# Patient Record
Sex: Female | Born: 1995 | Race: White | Hispanic: No | Smoking: Former smoker
Health system: Southern US, Community
[De-identification: ages and names within clinical notes are randomized; demographics above are authoritative.]

## PROBLEM LIST (undated history)

## (undated) ENCOUNTER — Inpatient Hospital Stay (HOSPITAL_COMMUNITY): Payer: Self-pay

## (undated) DIAGNOSIS — H9190 Unspecified hearing loss, unspecified ear: Secondary | ICD-10-CM

## (undated) DIAGNOSIS — F32A Depression, unspecified: Secondary | ICD-10-CM

## (undated) DIAGNOSIS — F329 Major depressive disorder, single episode, unspecified: Secondary | ICD-10-CM

## (undated) DIAGNOSIS — F419 Anxiety disorder, unspecified: Secondary | ICD-10-CM

## (undated) DIAGNOSIS — J45909 Unspecified asthma, uncomplicated: Secondary | ICD-10-CM

## (undated) DIAGNOSIS — F909 Attention-deficit hyperactivity disorder, unspecified type: Secondary | ICD-10-CM

## (undated) HISTORY — DX: Anxiety disorder, unspecified: F41.9

## (undated) HISTORY — DX: Major depressive disorder, single episode, unspecified: F32.9

## (undated) HISTORY — DX: Attention-deficit hyperactivity disorder, unspecified type: F90.9

## (undated) HISTORY — DX: Depression, unspecified: F32.A

## (undated) HISTORY — PX: NO PAST SURGERIES: SHX2092

---
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
## 2013-01-04 ENCOUNTER — Ambulatory Visit: Payer: Self-pay | Admitting: Advanced Practice Midwife

## 2013-01-11 ENCOUNTER — Ambulatory Visit: Payer: Self-pay | Admitting: Advanced Practice Midwife

## 2013-01-16 ENCOUNTER — Encounter: Payer: Self-pay | Admitting: Advanced Practice Midwife

## 2013-01-16 ENCOUNTER — Ambulatory Visit (INDEPENDENT_AMBULATORY_CARE_PROVIDER_SITE_OTHER): Payer: Medicaid Other | Admitting: Advanced Practice Midwife

## 2013-01-16 VITALS — BP 110/68 | Wt 123.4 lb

## 2013-01-16 DIAGNOSIS — IMO0001 Reserved for inherently not codable concepts without codable children: Secondary | ICD-10-CM

## 2013-01-16 DIAGNOSIS — Z3202 Encounter for pregnancy test, result negative: Secondary | ICD-10-CM

## 2013-01-16 MED ORDER — NORGESTIMATE-ETH ESTRADIOL 0.25-35 MG-MCG PO TABS: 1.0000 | ORAL_TABLET | Freq: Every day | ORAL | Status: DC

## 2013-01-16 NOTE — Progress Notes (Signed)
Patient ID: Lacey Hartman, female   DOB: 17-Aug-1996, 17 y.o.   MRN: [id number] Lacey Hartman is a 17 y.o. who presents for a postpartum visit. She is 8 weeks postpartum following a spontaneous vaginal delivery. I have fully reviewed the prenatal and intrapartum course. The delivery was at 36.1 gestational weeks.  Anesthesia: epidural. Postpartum course has been uneventful other than mild depression. Baby's course has been uneventful. Baby is feeding by bottle. Bleeding no bleeding. Has already had 1 menses. Bowel function is normal. Bladder function is normal. Patient is sexually active. Contraception method is none. Postpartum depression screening: positive, scored a 14. Denies suicidial/homicidal/infantcidle thoughts, feels like she is getting better.    Review of Systems Pertinent items are noted in HPI.   Objective:    There were no vitals taken for this visit.  General:  alert, cooperative and no distress   Breasts:  negative  Lungs: clear to auscultation bilaterally  Heart:  regular rate and rhythm  Abdomen: Soft, nontender   Vulva:  normal  Vagina: normal vagina  Cervix:  closed  Corpus: Well involuted     Rectal Exam: no hemorrhoids        Assessment:    Normal postpartum exam. Mild postpartum depression  Plan:    1. Contraception: OCP (estrogen/progesterone) 2. Follow up in:   or as needed.  3. Will not begin antidepressants at this time d/t pt preference. Pt to call office if feels like depression is not getting better or is worsening.

## 2013-07-23 ENCOUNTER — Telehealth: Payer: Self-pay | Admitting: Family Medicine

## 2013-07-23 NOTE — Telephone Encounter (Signed)
dysuria

## 2013-07-24 NOTE — Telephone Encounter (Signed)
The patient complained of dysuria. She brought her child into the office. She denied fever. Her urine did show WBCs. She was treated with Bactrim DS twice a day for 5 days. She was instructed if ongoing trouble to followup for official office visit she understood. Warnings discussed.

## 2013-10-18 NOTE — L&D Delivery Note (Signed)
Delivery Note Pt progressed nicely through labor.  She had SROM and quickly developed and urge to push. After a brief 2nd stage, at 4:42 AM a viable female was delivered via  (Presentation:ROA  ). THere was a loose nuchal cord, delivered throught.  APGAR: 9/9; weight pending .   Placenta status:  Placenta fell out at 0443, intact with 3 vessel cord:   Anesthesia:  epidural Episiotomy: none Lacerations: none Suture Repair: n/a Est. Blood Loss (mL): 100  Mom to postpartum.  Baby to Couplet care / Skin to Skin.  Hartman,Lacey Rubens 07/05/2014, 4:58 AM

## 2013-10-18 NOTE — L&D Delivery Note (Signed)
Attestation of Attending Supervision of Advanced Practitioner (CNM/NP): Evaluation and management procedures were performed by the Advanced Practitioner under my supervision and collaboration.  I have reviewed the Advanced Practitioner's note and chart, and I agree with the management and plan.  HARRAWAY-SMITH, Gerlean Cid 12:38 PM

## 2013-11-23 ENCOUNTER — Encounter: Payer: Self-pay | Admitting: Adult Health

## 2013-11-23 ENCOUNTER — Ambulatory Visit (INDEPENDENT_AMBULATORY_CARE_PROVIDER_SITE_OTHER): Payer: Medicaid Other | Admitting: Adult Health

## 2013-11-23 VITALS — BP 120/76 | Ht 62.0 in | Wt 115.0 lb

## 2013-11-23 DIAGNOSIS — Z3201 Encounter for pregnancy test, result positive: Secondary | ICD-10-CM

## 2013-11-23 DIAGNOSIS — Z3200 Encounter for pregnancy test, result unknown: Secondary | ICD-10-CM

## 2013-11-23 DIAGNOSIS — Z3202 Encounter for pregnancy test, result negative: Secondary | ICD-10-CM

## 2013-11-23 LAB — POCT URINE PREGNANCY: PREG TEST UR: POSITIVE

## 2013-11-23 MED ORDER — MEDROXYPROGESTERONE ACETATE 150 MG/ML IM SUSP: 150.0000 mg | Freq: Once | INTRAMUSCULAR | Status: DC

## 2013-11-27 ENCOUNTER — Other Ambulatory Visit: Payer: Self-pay | Admitting: Obstetrics & Gynecology

## 2013-11-27 DIAGNOSIS — O3680X0 Pregnancy with inconclusive fetal viability, not applicable or unspecified: Secondary | ICD-10-CM

## 2013-11-29 ENCOUNTER — Ambulatory Visit (INDEPENDENT_AMBULATORY_CARE_PROVIDER_SITE_OTHER): Payer: Medicaid Other

## 2013-11-29 DIAGNOSIS — O3680X0 Pregnancy with inconclusive fetal viability, not applicable or unspecified: Secondary | ICD-10-CM

## 2013-11-29 NOTE — Progress Notes (Signed)
U/S(8+1wks)-single IUP with +FCA noted, CRL c/w LMP dates, cx appears long and closed, bilateral adnexa wnl, FHR-175 bpm

## 2013-12-03 ENCOUNTER — Encounter: Payer: Self-pay | Admitting: Women's Health

## 2013-12-03 ENCOUNTER — Ambulatory Visit (INDEPENDENT_AMBULATORY_CARE_PROVIDER_SITE_OTHER): Payer: Medicaid Other | Admitting: Women's Health

## 2013-12-03 VITALS — BP 96/52 | Wt 115.0 lb

## 2013-12-03 DIAGNOSIS — F17200 Nicotine dependence, unspecified, uncomplicated: Secondary | ICD-10-CM

## 2013-12-03 DIAGNOSIS — O210 Mild hyperemesis gravidarum: Secondary | ICD-10-CM

## 2013-12-03 DIAGNOSIS — Z1389 Encounter for screening for other disorder: Secondary | ICD-10-CM

## 2013-12-03 DIAGNOSIS — Z331 Pregnant state, incidental: Secondary | ICD-10-CM

## 2013-12-03 DIAGNOSIS — O09219 Supervision of pregnancy with history of pre-term labor, unspecified trimester: Secondary | ICD-10-CM | POA: Insufficient documentation

## 2013-12-03 DIAGNOSIS — Z3480 Encounter for supervision of other normal pregnancy, unspecified trimester: Secondary | ICD-10-CM | POA: Insufficient documentation

## 2013-12-03 DIAGNOSIS — O09299 Supervision of pregnancy with other poor reproductive or obstetric history, unspecified trimester: Secondary | ICD-10-CM

## 2013-12-03 HISTORY — DX: Nicotine dependence, unspecified, uncomplicated: F17.200

## 2013-12-03 LAB — POCT URINALYSIS DIPSTICK
GLUCOSE UA: NEGATIVE
Ketones, UA: NEGATIVE
Leukocytes, UA: NEGATIVE
NITRITE UA: NEGATIVE
Protein, UA: NEGATIVE
RBC UA: NEGATIVE

## 2013-12-03 LAB — RPR

## 2013-12-03 MED ORDER — ONDANSETRON HCL 8 MG PO TABS: 8.0000 mg | ORAL_TABLET | Freq: Three times a day (TID) | ORAL | Status: DC | PRN

## 2013-12-03 NOTE — Patient Instructions (Signed)
Nausea & Vomiting  Have saltine crackers or pretzels by your bed and eat a few bites before you raise your head out of bed in the morning  Eat small frequent meals throughout the day instead of large meals  Drink plenty of fluids throughout the day to stay hydrated, just don't drink a lot of fluids with your meals.  This can make your stomach fill up faster making you feel sick  Do not brush your teeth right after you eat  Products with real ginger are good for nausea, like ginger ale and ginger hard candy Make sure it says made with real ginger!  Sucking on sour candy like lemon heads is also good for nausea  If your prenatal vitamins make you nauseated, take them at night so you will sleep through the nausea  If you feel like you need medicine for the nausea & vomiting please let us know  If you are unable to keep any fluids or food down please let us know   Constipation  Drink plenty of fluid, preferably water, throughout the day  Eat foods high in fiber such as fruits, vegetables, and grains  Exercise, such as walking, is a good way to keep your bowels regular  Drink warm fluids, especially warm prune juice, or decaf coffee  Eat a 1/2 cup of real oatmeal (not instant), 1/2 cup applesauce, and 1/2-1 cup warm prune juice every day  If needed, you may take Colace (docusate sodium) stool softener once or twice a day to help keep the stool soft. If you are pregnant, wait until you are out of your first trimester (12-14 weeks of pregnancy)  If you still are having problems with constipation, you may take Miralax once daily as needed to help keep your bowels regular.  If you are pregnant, wait until you are out of your first trimester (12-14 weeks of pregnancy)    Pregnancy - First Trimester During sexual intercourse, millions of sperm go into the vagina. Only 1 sperm will penetrate and fertilize the female egg while it is in the Fallopian tube. One week later, the fertilized egg  implants into the wall of the uterus. An embryo begins to develop into a baby. At 6 to 8 weeks, the eyes and face are formed and the heartbeat can be seen on ultrasound. At the end of 12 weeks (first trimester), all the baby's organs are formed. Now that you are pregnant, you will want to do everything you can to have a healthy baby. Two of the most important things are to get good prenatal care and follow your caregiver's instructions. Prenatal care is all the medical care you receive before the baby's birth. It is given to prevent, find, and treat problems during the pregnancy and childbirth. PRENATAL EXAMS  During prenatal visits, your weight, blood pressure, and urine are checked. This is done to make sure you are healthy and progressing normally during the pregnancy.  A pregnant woman should gain 25 to 35 pounds during the pregnancy. However, if you are overweight or underweight, your caregiver will advise you regarding your weight.  Your caregiver will ask and answer questions for you.  Blood work, cervical cultures, other necessary tests, and a Pap test are done during your prenatal exams. These tests are done to check on your health and the probable health of your baby. Tests are strongly recommended and done for HIV with your permission. This is the virus that causes AIDS. These tests are done because medicines   can be given to help prevent your baby from being born with this infection should you have been infected without knowing it. Blood work is also used to find out your blood type, previous infections, and follow your blood levels (hemoglobin).  Low hemoglobin (anemia) is common during pregnancy. Iron and vitamins are given to help prevent this. Later in the pregnancy, blood tests for diabetes will be done along with any other tests if any problems develop.  You may need other tests to make sure you and the baby are doing well. CHANGES DURING THE FIRST TRIMESTER  Your body goes through  many changes during pregnancy. They vary from person to person. Talk to your caregiver about changes you notice and are concerned about. Changes can include:  Your menstrual period stops.  The egg and sperm carry the genes that determine what you look like. Genes from you and your partner are forming a baby. The female genes determine whether the baby is a boy or a girl.  Your body increases in girth and you may feel bloated.  Feeling sick to your stomach (nauseous) and throwing up (vomiting). If the vomiting is uncontrollable, call your caregiver.  Your breasts will begin to enlarge and become tender.  Your nipples may stick out more and become darker.  The need to urinate more. Painful urination may mean you have a bladder infection.  Tiring easily.  Loss of appetite.  Cravings for certain kinds of food.  At first, you may gain or lose a couple of pounds.  You may have changes in your emotions from day to day (excited to be pregnant or concerned something may go wrong with the pregnancy and baby).  You may have more vivid and strange dreams. HOME CARE INSTRUCTIONS   It is very important to avoid all smoking, alcohol and non-prescribed drugs during your pregnancy. These affect the formation and growth of the baby. Avoid chemicals while pregnant to ensure the delivery of a healthy infant.  Start your prenatal visits by the 12th week of pregnancy. They are usually scheduled monthly at first, then more often in the last 2 months before delivery. Keep your caregiver's appointments. Follow your caregiver's instructions regarding medicine use, blood and lab tests, exercise, and diet.  During pregnancy, you are providing food for you and your baby. Eat regular, well-balanced meals. Choose foods such as meat, fish, milk and other low fat dairy products, vegetables, fruits, and whole-grain breads and cereals. Your caregiver will tell you of the ideal weight gain.  You can help morning  sickness by keeping soda crackers at the bedside. Eat a couple before arising in the morning. You may want to use the crackers without salt on them.  Eating 4 to 5 small meals rather than 3 large meals a day also may help the nausea and vomiting.  Drinking liquids between meals instead of during meals also seems to help nausea and vomiting.  A physical sexual relationship may be continued throughout pregnancy if there are no other problems. Problems may be early (premature) leaking of amniotic fluid from the membranes, vaginal bleeding, or belly (abdominal) pain.  Exercise regularly if there are no restrictions. Check with your caregiver or physical therapist if you are unsure of the safety of some of your exercises. Greater weight gain will occur in the last 2 trimesters of pregnancy. Exercising will help:  Control your weight.  Keep you in shape.  Prepare you for labor and delivery.  Help you lose your pregnancy  weight after you deliver your baby.  Wear a good support or jogging bra for breast tenderness during pregnancy. This may help if worn during sleep too.  Ask when prenatal classes are available. Begin classes when they are offered.  Do not use hot tubs, steam rooms, or saunas.  Wear your seat belt when driving. This protects you and your baby if you are in an accident.  Avoid raw meat, uncooked cheese, cat litter boxes, and soil used by cats throughout the pregnancy. These carry germs that can cause birth defects in the baby.  The first trimester is a good time to visit your dentist for your dental health. Getting your teeth cleaned is okay. Use a softer toothbrush and brush gently during pregnancy.  Ask for help if you have financial, counseling, or nutritional needs during pregnancy. Your caregiver will be able to offer counseling for these needs as well as refer you for other special needs.  Do not take any medicines or herbs unless told by your caregiver.  Inform your  caregiver if there is any mental or physical domestic violence.  Make a list of emergency phone numbers of family, friends, hospital, and police and fire departments.  Write down your questions. Take them to your prenatal visit.  Do not douche.  Do not cross your legs.  If you have to stand for long periods of time, rotate you feet or take small steps in a circle.  You may have more vaginal secretions that may require a sanitary pad. Do not use tampons or scented sanitary pads. MEDICINES AND DRUG USE IN PREGNANCY  Take prenatal vitamins as directed. The vitamin should contain 1 milligram of folic acid. Keep all vitamins out of reach of children. Only a couple vitamins or tablets containing iron may be fatal to a baby or young child when ingested.  Avoid use of all medicines, including herbs, over-the-counter medicines, not prescribed or suggested by your caregiver. Only take over-the-counter or prescription medicines for pain, discomfort, or fever as directed by your caregiver. Do not use aspirin, ibuprofen, or naproxen unless directed by your caregiver.  Let your caregiver also know about herbs you may be using.  Alcohol is related to a number of birth defects. This includes fetal alcohol syndrome. All alcohol, in any form, should be avoided completely. Smoking will cause low birth rate and premature babies.  Street or illegal drugs are very harmful to the baby. They are absolutely forbidden. A baby born to an addicted mother will be addicted at birth. The baby will go through the same withdrawal an adult does.  Let your caregiver know about any medicines that you have to take and for what reason you take them. SEEK MEDICAL CARE IF:  You have any concerns or worries during your pregnancy. It is better to call with your questions if you feel they cannot wait, rather than worry about them. SEEK IMMEDIATE MEDICAL CARE IF:   An unexplained oral temperature above 102 F (38.9 C) develops,  or as your caregiver suggests.  You have leaking of fluid from the vagina (birth canal). If leaking membranes are suspected, take your temperature and inform your caregiver of this when you call.  There is vaginal spotting or bleeding. Notify your caregiver of the amount and how many pads are used.  You develop a bad smelling vaginal discharge with a change in the color.  You continue to feel sick to your stomach (nauseated) and have no relief from remedies suggested. You   vomit blood or coffee ground-like materials.  You lose more than 2 pounds of weight in 1 week.  You gain more than 2 pounds of weight in 1 week and you notice swelling of your face, hands, feet, or legs.  You gain 5 pounds or more in 1 week (even if you do not have swelling of your hands, face, legs, or feet).  You get exposed to [place] measles and have never had them.  You are exposed to fifth disease or chickenpox.  You develop belly (abdominal) pain. Round ligament discomfort is a common non-cancerous (benign) cause of abdominal pain in pregnancy. Your caregiver still must evaluate this.  You develop headache, fever, diarrhea, pain with urination, or shortness of breath.  You fall or are in a car accident or have any kind of trauma.  There is mental or physical violence in your home. Document Released: 09/28/2001 Document Revised: 06/28/2012 Document Reviewed: 04/01/2009 [facility] Patient Information 2014 [place].

## 2013-12-03 NOTE — Progress Notes (Signed)
  Subjective:  Lacey Hartman is a 18 y.o. [phone] Caucasian female at 109w5d by LMP which differs from 8wk u/s by only 2d, being seen today for her first obstetrical visit.  Her obstetrical history is significant for smoker and prev spontaneous ptb @ 36wks, SAB in Dec.  Smoked 1-2ppd prior to pregnancy, down to 2cigs/day and trying to quit completely. Pregnancy history fully reviewed.  Patient reports headache, nausea and vomiting and constipation. Is already taking 4diclegis daily and has been taking some of her grandmother's zofran. Vomits ~ 3x/day.  Denies vb, cramping, uti s/s, abnormal/malodorous vag d/c, or vulvovaginal itching/irritation.  BP 96/52  Wt 115 lb (52.164 kg)  LMP 10/03/2013  HISTORY: OB History  Gravida Para Term Preterm AB SAB TAB Ectopic Multiple Living  3 1 0 1 1 1 0 0 0 1     # Outcome Date GA Lbr Len/2nd Weight Sex Delivery Anes PTL Lv  3 CUR           2 SAB 09/2013          1 PRE 11/23/12 [redacted]w[redacted]d 05:41 / 00:10 5 lb 7.8 oz (2.489 kg) F SVD EPI  Y     Past Medical History  Diagnosis Date  . Hard of hearing   . Asthma     inhaler prn   Past Surgical History  Procedure Laterality Date  . No past surgeries     Family History  Problem Relation Age of Onset  . Cancer Maternal Grandmother   . Cancer Maternal Uncle     Exam   System:     General: Well developed & nourished, no acute distress   Skin: Warm & dry, normal coloration and turgor, no rashes   Neurologic: Alert & oriented, normal mood   Cardiovascular: Regular rate & rhythm   Respiratory: Effort & rate normal, LCTAB, acyanotic   Abdomen: Soft, non tender   Extremities: normal strength, tone   Thin prep pap smear n/a d/t age  FHR: 72 via informal transabdominal u/s   Assessment:   Pregnancy: G3P0111 Patient Active Problem List   Diagnosis Date Noted  . Supervision of other normal pregnancy 12/03/2013    Priority: High  . Previous preterm delivery, antepartum 12/03/2013    Priority: High   . Smoker 12/03/2013    Priority: High    [redacted]w[redacted]d G3P0111 New OB visit H/O spont 36wk PTB Smoker N/V of pregnancy Constipation  Plan:  Initial labs drawn Continue prenatal vitamins Problem list reviewed and updated Reviewed n/v relief measures and warning s/s to report, already taking diclegis QID Rx zofran per pt request Reviewed recommended weight gain based on pre-gravid BMI Encouraged well-balanced diet Genetic Screening discussed Integrated Screen: requested Cystic fibrosis screening discussed neg prev preg Ultrasound discussed; fetal survey: requested Follow up in 4 weeks for 1st it/nt and lrob Discussed/offered Makena, will take pamphlet home to review and will let us know at next visit [place] completed Info on constipation given Encouraged smoking cessation  Tawnya Crook CNM, [facility] 12/03/2013 11:45 AM

## 2013-12-04 LAB — CBC
HCT: 40.2 % (ref 36.0–46.0)
Hemoglobin: 13.2 g/dL (ref 12.0–15.0)
MCH: 30.6 pg (ref 26.0–34.0)
MCHC: 32.8 g/dL (ref 30.0–36.0)
MCV: 93.1 fL (ref 78.0–100.0)
PLATELETS: 273 10*3/uL (ref 150–400)
RBC: 4.32 MIL/uL (ref 3.87–5.11)
RDW: 13.4 % (ref 11.5–15.5)
WBC: 7.9 10*3/uL (ref 4.0–10.5)

## 2013-12-04 LAB — URINALYSIS
BILIRUBIN URINE: NEGATIVE
GLUCOSE, UA: NEGATIVE mg/dL
HGB URINE DIPSTICK: NEGATIVE
KETONES UR: NEGATIVE mg/dL
LEUKOCYTES UA: NEGATIVE
NITRITE: NEGATIVE
PROTEIN: NEGATIVE mg/dL
SPECIFIC GRAVITY, URINE: 1.021 (ref 1.005–1.030)
UROBILINOGEN UA: 0.2 mg/dL (ref 0.0–1.0)
pH: 6.5 (ref 5.0–8.0)

## 2013-12-04 LAB — OXYCODONE SCREEN, UA, RFLX CONFIRM: Oxycodone Screen, Ur: NEGATIVE ng/mL

## 2013-12-04 LAB — RUBELLA SCREEN: RUBELLA: 4.2 {index} — AB (ref <0.90)

## 2013-12-04 LAB — GC/CHLAMYDIA PROBE AMP
CT Probe RNA: NEGATIVE
GC Probe RNA: NEGATIVE

## 2013-12-04 LAB — HEPATITIS B SURFACE ANTIGEN: HEP B S AG: NEGATIVE

## 2013-12-04 LAB — ANTIBODY SCREEN: ANTIBODY SCREEN: NEGATIVE

## 2013-12-04 LAB — VARICELLA ZOSTER ANTIBODY, IGG: Varicella IgG: >4000 Index — ABNORMAL HIGH (ref <135.00)

## 2013-12-04 LAB — ABO AND RH: Rh Type: POSITIVE

## 2013-12-04 LAB — HIV ANTIBODY (ROUTINE TESTING W REFLEX): HIV: NONREACTIVE

## 2013-12-05 ENCOUNTER — Encounter: Payer: Self-pay | Admitting: Women's Health

## 2013-12-05 DIAGNOSIS — O99330 Smoking (tobacco) complicating pregnancy, unspecified trimester: Secondary | ICD-10-CM

## 2013-12-05 LAB — DRUG SCREEN, URINE, NO CONFIRMATION
Amphetamine Screen, Ur: NEGATIVE
BARBITURATE QUANT UR: NEGATIVE
Benzodiazepines.: NEGATIVE
Cocaine Metabolites: NEGATIVE
Creatinine,U: 173.7 mg/dL
MARIJUANA METABOLITE: NEGATIVE
METHADONE: NEGATIVE
OPIATE SCREEN, URINE: NEGATIVE
PHENCYCLIDINE (PCP): NEGATIVE
PROPOXYPHENE: NEGATIVE

## 2013-12-05 LAB — URINE CULTURE
Colony Count: NO GROWTH
Organism ID, Bacteria: NO GROWTH

## 2013-12-06 ENCOUNTER — Encounter: Payer: Self-pay | Admitting: Advanced Practice Midwife

## 2014-01-01 ENCOUNTER — Ambulatory Visit (INDEPENDENT_AMBULATORY_CARE_PROVIDER_SITE_OTHER): Payer: Medicaid Other

## 2014-01-01 ENCOUNTER — Ambulatory Visit (INDEPENDENT_AMBULATORY_CARE_PROVIDER_SITE_OTHER): Payer: Medicaid Other | Admitting: Women's Health

## 2014-01-01 ENCOUNTER — Encounter (INDEPENDENT_AMBULATORY_CARE_PROVIDER_SITE_OTHER): Payer: Self-pay

## 2014-01-01 ENCOUNTER — Other Ambulatory Visit: Payer: Self-pay | Admitting: Women's Health

## 2014-01-01 ENCOUNTER — Encounter: Payer: Self-pay | Admitting: Women's Health

## 2014-01-01 VITALS — BP 98/54 | Wt 114.0 lb

## 2014-01-01 DIAGNOSIS — O210 Mild hyperemesis gravidarum: Secondary | ICD-10-CM

## 2014-01-01 DIAGNOSIS — O09299 Supervision of pregnancy with other poor reproductive or obstetric history, unspecified trimester: Secondary | ICD-10-CM

## 2014-01-01 DIAGNOSIS — Z3480 Encounter for supervision of other normal pregnancy, unspecified trimester: Secondary | ICD-10-CM

## 2014-01-01 DIAGNOSIS — Z1389 Encounter for screening for other disorder: Secondary | ICD-10-CM

## 2014-01-01 DIAGNOSIS — Z331 Pregnant state, incidental: Secondary | ICD-10-CM

## 2014-01-01 DIAGNOSIS — O0990 Supervision of high risk pregnancy, unspecified, unspecified trimester: Secondary | ICD-10-CM

## 2014-01-01 DIAGNOSIS — O09219 Supervision of pregnancy with history of pre-term labor, unspecified trimester: Secondary | ICD-10-CM

## 2014-01-01 DIAGNOSIS — Z36 Encounter for antenatal screening of mother: Secondary | ICD-10-CM

## 2014-01-01 DIAGNOSIS — F17200 Nicotine dependence, unspecified, uncomplicated: Secondary | ICD-10-CM

## 2014-01-01 DIAGNOSIS — O99330 Smoking (tobacco) complicating pregnancy, unspecified trimester: Secondary | ICD-10-CM

## 2014-01-01 LAB — POCT URINALYSIS DIPSTICK
Blood, UA: NEGATIVE
GLUCOSE UA: NEGATIVE
Ketones, UA: NEGATIVE
Leukocytes, UA: NEGATIVE
Nitrite, UA: NEGATIVE

## 2014-01-01 NOTE — Progress Notes (Signed)
U/S(12+6wks)-single active fetus, CRL c/w dates, cx appears closed (3.1cm), bilateral adnexa WNL, NB present, NT-1.55mm, FHR- 150 BPM, anterior Gr 0 placenta noted

## 2014-01-01 NOTE — Progress Notes (Addendum)
Denies cramping, lof, vb, uti s/s. Gma told her she shouldn't take zofran b/c of commercial on tv, discussed r/b zofran, failed diclegis, doesn't want phenergan. Pt desires to continue zofran. Constipation- discussed prevention/relief measures. HA's. Encouraged increasing po hydration, eating small frequent meals/snacks, apap prn as directed on bottle.  Smoking 4cigs/day. Encouraged cessation. Wants to quit. [facility] referral completed and faxed. Reviewed warning s/s to report.  All questions answered. F/U in 4wks for 2nd IT and visit.  1st NT/IT today.

## 2014-01-01 NOTE — Patient Instructions (Addendum)
Constipation  Drink plenty of fluid, preferably water, throughout the day  Eat foods high in fiber such as fruits, vegetables, and grains  Exercise, such as walking, is a good way to keep your bowels regular  Drink warm fluids, especially warm prune juice, or decaf coffee  Eat a 1/2 cup of real oatmeal (not instant), 1/2 cup applesauce, and 1/2-1 cup warm prune juice every day  If needed, you may take Colace (docusate sodium) stool softener once or twice a day to help keep the stool soft. If you are pregnant, wait until you are out of your first trimester (12-14 weeks of pregnancy)  If you still are having problems with constipation, you may take Miralax once daily as needed to help keep your bowels regular.  If you are pregnant, wait until you are out of your first trimester (12-14 weeks of pregnancy)    Smoking Cessation Quitting smoking is important to your health and has many advantages. However, it is not always easy to quit since nicotine is a very addictive drug. Often times, people try 3 times or more before being able to quit. This document explains the best ways for you to prepare to quit smoking. Quitting takes hard work and a lot of effort, but you can do it. ADVANTAGES OF QUITTING SMOKING  You will live longer, feel better, and live better.  Your body will feel the impact of quitting smoking almost immediately.  Within 20 minutes, blood pressure decreases. Your pulse returns to its normal level.  After 8 hours, carbon monoxide levels in the blood return to normal. Your oxygen level increases.  After 24 hours, the chance of having a heart attack starts to decrease. Your breath, hair, and body stop smelling like smoke.  After 48 hours, damaged nerve endings begin to recover. Your sense of taste and smell improve.  After 72 hours, the body is virtually free of nicotine. Your bronchial tubes relax and breathing becomes easier.  After 2 to 12 weeks, lungs can hold more  air. Exercise becomes easier and circulation improves.  The risk of having a heart attack, stroke, cancer, or lung disease is greatly reduced.  After 1 year, the risk of coronary heart disease is cut in half.  After 5 years, the risk of stroke falls to the same as a nonsmoker.  After 10 years, the risk of lung cancer is cut in half and the risk of other cancers decreases significantly.  After 15 years, the risk of coronary heart disease drops, usually to the level of a nonsmoker.  If you are pregnant, quitting smoking will improve your chances of having a healthy baby.  The people you live with, especially any children, will be healthier.  You will have extra money to spend on things other than cigarettes. QUESTIONS TO THINK ABOUT BEFORE ATTEMPTING TO QUIT You may want to talk about your answers with your caregiver.  Why do you want to quit?  If you tried to quit in the past, what helped and what did not?  What will be the most difficult situations for you after you quit? How will you plan to handle them?  Who can help you through the tough times? Your family? Friends? A caregiver?  What pleasures do you get from smoking? What ways can you still get pleasure if you quit? Here are some questions to ask your caregiver:  How can you help me to be successful at quitting?  What medicine do you think would be best  for me and how should I take it?  What should I do if I need more help?  What is smoking withdrawal like? How can I get information on withdrawal? GET READY  Set a quit date.  Change your environment by getting rid of all cigarettes, ashtrays, matches, and lighters in your home, car, or work. Do not let people smoke in your home.  Review your past attempts to quit. Think about what worked and what did not. GET SUPPORT AND ENCOURAGEMENT You have a better chance of being successful if you have help. You can get support in many ways.  Tell your family, friends, and  co-workers that you are going to quit and need their support. Ask them not to smoke around you.  Get individual, group, or telephone counseling and support. Programs are available at [employer] and health centers. Call your local health department for information about programs in your area.  Spiritual beliefs and practices may help some smokers quit.  Download a "quit meter" on your computer to keep track of quit statistics, such as how long you have gone without smoking, cigarettes not smoked, and money saved.  Get a self-help book about quitting smoking and staying off of tobacco. [place] yourself from urges to smoke. Talk to someone, go for a walk, or occupy your time with a task.  Change your normal routine. Take a different route to work. Drink tea instead of coffee. Eat breakfast in a different place.  Reduce your stress. Take a hot bath, exercise, or read a book.  Plan something enjoyable to do every day. Reward yourself for not smoking.  Explore interactive web-based programs that specialize in helping you quit. GET MEDICINE AND USE IT CORRECTLY Medicines can help you stop smoking and decrease the urge to smoke. Combining medicine with the above behavioral methods and support can greatly increase your chances of successfully quitting smoking.  Nicotine replacement therapy helps deliver nicotine to your body without the negative effects and risks of smoking. Nicotine replacement therapy includes nicotine gum, lozenges, inhalers, nasal sprays, and skin patches. Some may be available over-the-counter and others require a prescription.  Antidepressant medicine helps people abstain from smoking, but how this works is unknown. This medicine is available by prescription.  Nicotinic receptor partial agonist medicine simulates the effect of nicotine in your brain. This medicine is available by prescription. Ask your caregiver for advice about which  medicines to use and how to use them based on your health history. Your caregiver will tell you what side effects to look out for if you choose to be on a medicine or therapy. Carefully read the information on the package. Do not use any other product containing nicotine while using a nicotine replacement product.  RELAPSE OR DIFFICULT SITUATIONS Most relapses occur within the first 3 months after quitting. Do not be discouraged if you start smoking again. Remember, most people try several times before finally quitting. You may have symptoms of withdrawal because your body is used to nicotine. You may crave cigarettes, be irritable, feel very hungry, cough often, get headaches, or have difficulty concentrating. The withdrawal symptoms are only temporary. They are strongest when you first quit, but they will go away within 10 14 days. To reduce the chances of relapse, try to:  Avoid drinking alcohol. Drinking lowers your chances of successfully quitting.  Reduce the amount of caffeine you consume. Once you quit smoking, the amount of caffeine in your body  increases and can give you symptoms, such as a rapid heartbeat, sweating, and anxiety.  Avoid smokers because they can make you want to smoke.  Do not let weight gain distract you. Many smokers will gain weight when they quit, usually less than 10 pounds. Eat a healthy diet and stay active. You can always lose the weight gained after you quit.  Find ways to improve your mood other than smoking. FOR MORE INFORMATION  www.smokefree.gov  Document Released: 09/28/2001 Document Revised: 04/04/2012 Document Reviewed: 01/13/2012 [facility] Patient Information 2014 [place], [place].

## 2014-01-04 LAB — MATERNAL SCREEN, INTEGRATED #1

## 2014-01-09 ENCOUNTER — Telehealth: Payer: Self-pay

## 2014-01-09 NOTE — Telephone Encounter (Signed)
Pt states had some vaginal bleeding and cramping after sex last night, bleeding has stopped now. Informed to push fluids, can take tylenol for cramping. If bleeding reoccurs, cramping increases or does not improve to call our office back. Pt verbalized understanding.

## 2014-01-12 ENCOUNTER — Encounter (HOSPITAL_COMMUNITY): Payer: Self-pay | Admitting: *Deleted

## 2014-01-12 ENCOUNTER — Inpatient Hospital Stay (HOSPITAL_COMMUNITY)
Admission: AD | Admit: 2014-01-12 | Discharge: 2014-01-12 | Disposition: A | Discharge status: Home / Self Care | Payer: Medicaid Other | Source: Ambulatory Visit | Attending: Obstetrics & Gynecology | Admitting: Obstetrics & Gynecology

## 2014-01-12 DIAGNOSIS — E860 Dehydration: Secondary | ICD-10-CM | POA: Insufficient documentation

## 2014-01-12 DIAGNOSIS — R112 Nausea with vomiting, unspecified: Secondary | ICD-10-CM

## 2014-01-12 DIAGNOSIS — O99330 Smoking (tobacco) complicating pregnancy, unspecified trimester: Secondary | ICD-10-CM | POA: Insufficient documentation

## 2014-01-12 DIAGNOSIS — O99891 Other specified diseases and conditions complicating pregnancy: Secondary | ICD-10-CM

## 2014-01-12 DIAGNOSIS — R109 Unspecified abdominal pain: Secondary | ICD-10-CM | POA: Insufficient documentation

## 2014-01-12 DIAGNOSIS — R5383 Other fatigue: Secondary | ICD-10-CM

## 2014-01-12 DIAGNOSIS — H9190 Unspecified hearing loss, unspecified ear: Secondary | ICD-10-CM | POA: Insufficient documentation

## 2014-01-12 DIAGNOSIS — K5900 Constipation, unspecified: Secondary | ICD-10-CM | POA: Insufficient documentation

## 2014-01-12 DIAGNOSIS — O9989 Other specified diseases and conditions complicating pregnancy, childbirth and the puerperium: Secondary | ICD-10-CM

## 2014-01-12 DIAGNOSIS — O211 Hyperemesis gravidarum with metabolic disturbance: Secondary | ICD-10-CM | POA: Insufficient documentation

## 2014-01-12 DIAGNOSIS — O09219 Supervision of pregnancy with history of pre-term labor, unspecified trimester: Secondary | ICD-10-CM

## 2014-01-12 DIAGNOSIS — R5381 Other malaise: Secondary | ICD-10-CM

## 2014-01-12 DIAGNOSIS — O219 Vomiting of pregnancy, unspecified: Secondary | ICD-10-CM

## 2014-01-12 LAB — URINALYSIS, ROUTINE W REFLEX MICROSCOPIC
Bilirubin Urine: NEGATIVE
GLUCOSE, UA: NEGATIVE mg/dL
Hgb urine dipstick: NEGATIVE
Ketones, ur: 40 mg/dL — AB
Nitrite: NEGATIVE
Protein, ur: NEGATIVE mg/dL
UROBILINOGEN UA: 0.2 mg/dL (ref 0.0–1.0)
pH: 6 (ref 5.0–8.0)

## 2014-01-12 LAB — URINE MICROSCOPIC-ADD ON

## 2014-01-12 MED ORDER — PROMETHAZINE HCL 25 MG/ML IJ SOLN
25.0000 mg | INTRAVENOUS | Status: DC
  Administered 2014-01-12: 25 mg via INTRAVENOUS
  Filled 2014-01-12: qty 1

## 2014-01-12 NOTE — MAU Note (Signed)
Patient states she has had vomiting for 4 days and has not been able to keep anything down. States she has been taking Zofran but is not working. Has a little cramping but is constipated and has not had a good BM in about 1 1/2 weeks. Denies bleeding or discharge.

## 2014-01-12 NOTE — MAU Provider Note (Signed)
Attestation of Attending Supervision of Advanced Practitioner (PA/CNM/NP): Evaluation and management procedures were performed by the Advanced Practitioner under my supervision and collaboration.  I have reviewed the Advanced Practitioner's note and chart, and I agree with the management and plan.  Ahria Slappey, MD, FACOG Attending Obstetrician & Gynecologist Faculty Practice, [facility]  

## 2014-01-12 NOTE — MAU Provider Note (Signed)
Chief Complaint: Emesis During Pregnancy and Fatigue   First Provider Initiated Contact with Patient 01/12/14 1721     SUBJECTIVE HPI: Lacey Hartman is a 18 y.o. [id number] at [redacted]w[redacted]d by LMP who presents with worsening nausea and vomiting for the past 4 days. Has not retained any solids but retained sips of soda today. Tired but denies orthostatic symptoms. Has been busy caring for 63 month old. Takes Zofran once a day but it is not effective. Had Diglegis which was not effective.   Weight at NOB 8wks 115#.  Past Medical History  Diagnosis Date  . Hard of hearing   . Asthma     inhaler prn   OB History  Gravida Para Term Preterm AB SAB TAB Ectopic Multiple Living  3 1 0 1 1 1 0 0 0 1     # Outcome Date GA Lbr Len/2nd Weight Sex Delivery Anes PTL Lv  3 CUR           2 SAB 08/2013          1 PRE 11/23/12 [redacted]w[redacted]d 05:41 / 00:10 2.489 kg (5 lb 7.8 oz) F SVD EPI  Y     Past Surgical History  Procedure Laterality Date  . No past surgeries     History   Social History  . Marital Status: Single    Spouse Name: N/A    Number of Children: N/A  . Years of Education: N/A   Occupational History  . Not on file.   Social History Main Topics  . Smoking status: Current Every Day Smoker -- 0.50 packs/day for 5 years    Types: Cigarettes  . Smokeless tobacco: Never Used  . Alcohol Use: No  . Drug Use: No  . Sexual Activity: Yes    Birth Control/ Protection: None   Other Topics Concern  . Not on file   Social History Narrative  . No narrative on file   No current facility-administered medications on file prior to encounter.   Current Outpatient Prescriptions on File Prior to Encounter  Medication Sig Dispense Refill  . acetaminophen (TYLENOL) 500 MG tablet Take 500 mg by mouth every 6 (six) hours as needed for headache.       . ondansetron (ZOFRAN) 8 MG tablet Take 1 tablet (8 mg total) by mouth every 8 (eight) hours as needed for nausea or vomiting.  30 tablet  1  . Prenatal Vit-Fe  Fumarate-FA (PRENATAL MULTIVITAMIN) TABS Take 1 tablet by mouth daily.       Allergies  Allergen Reactions  . Codeine Hives    & hallucinations    ROS: Pertinent items in HPI  OBJECTIVE Blood pressure 96/60, pulse 87, temperature 98.6 F (37 C), temperature source Oral, resp. rate 16, height 5\' 2"  (1.575 m), weight 52.254 kg (115 lb 3.2 oz), last menstrual period 10/03/2013, SpO2 98.00%, not currently breastfeeding. GENERAL: Well-developed, well-nourished female in no acute distress.  HEENT: Normocephalic HEART: normal rate RESP: normal effort ABDOMEN: Soft, non-tender, S=D, DT 148 EXTREMITIES: Nontender, no edema NEURO: Alert and oriented   LAB RESULTS Results for orders placed during the hospital encounter of 01/12/14 (from the past 24 hour(s))  URINALYSIS, ROUTINE W REFLEX MICROSCOPIC     Status: Abnormal   Collection Time    01/12/14  5:00 PM      Result Value Ref Range   Color, Urine YELLOW  YELLOW   APPearance CLEAR  CLEAR   Specific Gravity, Urine >1.030 (*) 1.005 - 1.030  pH 6.0  5.0 - 8.0   Glucose, UA NEGATIVE  NEGATIVE mg/dL   Hgb urine dipstick NEGATIVE  NEGATIVE   Bilirubin Urine NEGATIVE  NEGATIVE   Ketones, ur 40 (*) NEGATIVE mg/dL   Protein, ur NEGATIVE  NEGATIVE mg/dL   Urobilinogen, UA 0.2  0.0 - 1.0 mg/dL   Nitrite NEGATIVE  NEGATIVE   Leukocytes, UA SMALL (*) NEGATIVE  URINE MICROSCOPIC-ADD ON     Status: Abnormal   Collection Time    01/12/14  5:00 PM      Result Value Ref Range   Squamous Epithelial / LPF MANY (*) RARE   WBC, UA 3-6  <3 WBC/hpf   Urine-Other MUCOUS PRESENT      IMAGING US Fetal Nuchal Translucency Measurement  01/01/2014   NUCHAL TRANSLUCENCY FOR INTEGRATED TESTING   Lacey Hartman is in the office for nuchal translucency sonogram as part  of an integrated screen.  She is a 18 y.o. year old [id number] with Estimated Date of Delivery: 07/10/14  by LMP now at  [redacted]w[redacted]d weeks gestation. Thus far the pregnancy has been  complicated by  prev PTD.  GESTATION: SINGLETON  FETAL ACTIVITY:          Heart rate         150 bpm          The fetus is active.  AMNIOTIC FLUID: The amniotic fluid volume is  normal,   PLACENTA LOCALIZATION:  anterior GRADE 0  CERVIX: Measures 3.1 cm  ADNEXA: The ovaries are normal.  GESTATIONAL AGE AND  BIOMETRICS:  Gestational criteria: Estimated Date of Delivery: 07/10/14 by LMP now at  [redacted]w[redacted]d  Previous Scans:1  GESTATIONAL SAC            mm          weeks  CROWN RUMP LENGTH           67.7 mm         13+0 weeks  NUCHAL TRANSLUCENCY           1.45 mm         normal                                                                           AVERAGE EGA(BY THIS SCAN):   13+0 weeks   The fetal nasal bone is identified.    TECHNICIAN COMMENTS:  U/S(12+6wks)-single active fetus, CRL c/w dates, cx appears closed  (3.1cm), bilateral adnexa WNL, NB present, NT-1.38mm, FHR- 150 BPM,  anterior Gr 0 placenta noted    The patient will have the first blood draw of her integrated screening  today and the second draw in approximately 4 weeks.  Lazarus Gowda 01/01/2014 2:51 PM                                                           MAU COURSE  IVLR with Phenergan 25mg  given 1900: eating [employer] food and not nauseated  ASSESSMENT 1. Previous preterm delivery, antepartum   2. Nausea  and vomiting in pregnancy prior to [redacted] weeks gestation   3. Dehydration     PLAN Discharge home    Medication List         acetaminophen 500 MG tablet  Commonly known as:  TYLENOL  Take 500 mg by mouth every 6 (six) hours as needed for headache.     ondansetron 8 MG tablet  Commonly known as:  ZOFRAN  Take 1 tablet (8 mg total) by mouth every 8 (eight) hours as needed for nausea or vomiting.     prenatal multivitamin Tabs tablet  Take 1 tablet by mouth daily.       Follow-up Information   Follow up with [facility] On 01/29/2014.   Contact information:   [place] [id number] [place], CNM 01/12/2014  5:24 PM

## 2014-01-12 NOTE — MAU Note (Signed)
Patient feeling better. Eating [employer]

## 2014-01-12 NOTE — Discharge Instructions (Signed)
Morning Sickness °Morning sickness is when you feel sick to your stomach (nauseous) during pregnancy. This nauseous feeling may or may not come with vomiting. It often occurs in the morning but can be a problem any time of day. Morning sickness is most common during the first trimester, but it may continue throughout pregnancy. While morning sickness is unpleasant, it is usually harmless unless you develop severe and continual vomiting (hyperemesis gravidarum). This condition requires more intense treatment.  °CAUSES  °The cause of morning sickness is not completely known but seems to be related to normal hormonal changes that occur in pregnancy. °RISK FACTORS °You are at greater risk if you: °· Experienced nausea or vomiting before your pregnancy. °· Had morning sickness during a previous pregnancy. °· Are pregnant with more than one baby, such as twins. °TREATMENT  °Do not use any medicines (prescription, over-the-counter, or herbal) for morning sickness without first talking to your health care provider. Your health care provider may prescribe or recommend: °· Vitamin B6 supplements. °· Anti-nausea medicines. °· The herbal medicine ginger. °HOME CARE INSTRUCTIONS  °· Only take over-the-counter or prescription medicines as directed by your health care provider. °· Taking multivitamins before getting pregnant can prevent or decrease the severity of morning sickness in most women.   °· Eat a piece of dry toast or unsalted crackers before getting out of bed in the morning.   °· Eat five or six small meals a day.   °· Eat dry and bland foods (rice, baked potato ). Foods high in carbohydrates are often helpful.  °· Do not drink liquids with your meals. Drink liquids between meals.   °· Avoid greasy, fatty, and spicy foods.   °· Get someone to cook for you if the smell of any food causes nausea and vomiting.   °· If you feel nauseous after taking prenatal vitamins, take the vitamins at night or with a snack.  °· Snack  on protein foods (nuts, yogurt, cheese) between meals if you are hungry.   °· Eat unsweetened gelatins for desserts.   °· Wearing an acupressure wristband (worn for sea sickness) may be helpful.   °· Acupuncture may be helpful.   °· Do not smoke.   °· Get a humidifier to keep the air in your house free of odors.   °· Get plenty of fresh air. °SEEK MEDICAL CARE IF:  °· Your home remedies are not working, and you need medicine. °· You feel dizzy or lightheaded. °· You are losing weight. °SEEK IMMEDIATE MEDICAL CARE IF:  °· You have persistent and uncontrolled nausea and vomiting. °· You pass out (faint). °Document Released: 11/25/2006 Document Revised: 06/06/2013 Document Reviewed: 03/21/2013 °ExitCare® Patient Information ©2014 [employer]. ° °

## 2014-01-29 ENCOUNTER — Ambulatory Visit (INDEPENDENT_AMBULATORY_CARE_PROVIDER_SITE_OTHER): Payer: Medicaid Other | Admitting: Women's Health

## 2014-01-29 ENCOUNTER — Encounter: Payer: Self-pay | Admitting: Women's Health

## 2014-01-29 VITALS — BP 108/72 | Wt 118.2 lb

## 2014-01-29 DIAGNOSIS — O99330 Smoking (tobacco) complicating pregnancy, unspecified trimester: Secondary | ICD-10-CM

## 2014-01-29 DIAGNOSIS — O09219 Supervision of pregnancy with history of pre-term labor, unspecified trimester: Secondary | ICD-10-CM

## 2014-01-29 DIAGNOSIS — O09299 Supervision of pregnancy with other poor reproductive or obstetric history, unspecified trimester: Secondary | ICD-10-CM

## 2014-01-29 DIAGNOSIS — Z3480 Encounter for supervision of other normal pregnancy, unspecified trimester: Secondary | ICD-10-CM

## 2014-01-29 MED ORDER — PROMETHAZINE HCL 25 MG PO TABS: 25.0000 mg | ORAL_TABLET | Freq: Four times a day (QID) | ORAL | Status: DC | PRN

## 2014-01-29 NOTE — Progress Notes (Signed)
Unable to void for specimen. Had to go to hospital for dehydration, was given phenergan- likes it better, requests rx. Rx sent. Denies cramping, lof, vb, uti s/s.  No complaints.  Reviewed warning s/s to report.  All questions answered. F/U in 4wks for anatomy u/s and visit.  2nd IT today.

## 2014-01-29 NOTE — Patient Instructions (Signed)
Second Trimester of Pregnancy The second trimester is from week 13 through week 28, months 4 through 6. The second trimester is often a time when you feel your best. Your body has also adjusted to being pregnant, and you begin to feel better physically. Usually, morning sickness has lessened or quit completely, you may have more energy, and you may have an increase in appetite. The second trimester is also a time when the fetus is growing rapidly. At the end of the sixth month, the fetus is about 9 inches long and weighs about 1 pounds. You will likely begin to feel the baby move (quickening) between 18 and 20 weeks of the pregnancy. BODY CHANGES Your body goes through many changes during pregnancy. The changes vary from woman to woman.   Your weight will continue to increase. You will notice your lower abdomen bulging out.  You may begin to get stretch marks on your hips, abdomen, and breasts.  You may develop headaches that can be relieved by medicines approved by your caregiver.  You may urinate more often because the fetus is pressing on your bladder.  You may develop or continue to have heartburn as a result of your pregnancy.  You may develop constipation because certain hormones are causing the muscles that push waste through your intestines to slow down.  You may develop hemorrhoids or swollen, bulging veins (varicose veins).  You may have back pain because of the weight gain and pregnancy hormones relaxing your joints between the bones in your pelvis and as a result of a shift in weight and the muscles that support your balance.  Your breasts will continue to grow and be tender.  Your gums may bleed and may be sensitive to brushing and flossing.  Dark spots or blotches (chloasma, mask of pregnancy) may develop on your face. This will likely fade after the baby is born.  A dark line from your belly button to the pubic area (linea nigra) may appear. This will likely fade after the  baby is born. WHAT TO EXPECT AT YOUR PRENATAL VISITS During a routine prenatal visit:  You will be weighed to make sure you and the fetus are growing normally.  Your blood pressure will be taken.  Your abdomen will be measured to track your baby's growth.  The fetal heartbeat will be listened to.  Any test results from the previous visit will be discussed. Your caregiver may ask you:  How you are feeling.  If you are feeling the baby move.  If you have had any abnormal symptoms, such as leaking fluid, bleeding, severe headaches, or abdominal cramping.  If you have any questions. Other tests that may be performed during your second trimester include:  Blood tests that check for:  Low iron levels (anemia).  Gestational diabetes (between 24 and 28 weeks).  Rh antibodies.  Urine tests to check for infections, diabetes, or protein in the urine.  An ultrasound to confirm the proper growth and development of the baby.  An amniocentesis to check for possible genetic problems.  Fetal screens for spina bifida and Down syndrome. HOME CARE INSTRUCTIONS   Avoid all smoking, herbs, alcohol, and unprescribed drugs. These chemicals affect the formation and growth of the baby.  Follow your caregiver's instructions regarding medicine use. There are medicines that are either safe or unsafe to take during pregnancy.  Exercise only as directed by your caregiver. Experiencing uterine cramps is a good sign to stop exercising.  Continue to eat regular,   healthy meals.  Wear a good support bra for breast tenderness.  Do not use hot tubs, steam rooms, or saunas.  Wear your seat belt at all times when driving.  Avoid raw meat, uncooked cheese, cat litter boxes, and soil used by cats. These carry germs that can cause birth defects in the baby.  Take your prenatal vitamins.  Try taking a stool softener (if your caregiver approves) if you develop constipation. Eat more high-fiber foods,  such as fresh vegetables or fruit and whole grains. Drink plenty of fluids to keep your urine clear or pale yellow.  Take warm sitz baths to soothe any pain or discomfort caused by hemorrhoids. Use hemorrhoid cream if your caregiver approves.  If you develop varicose veins, wear support hose. Elevate your feet for 15 minutes, 3 4 times a day. Limit salt in your diet.  Avoid heavy lifting, wear low heel shoes, and practice good posture.  Rest with your legs elevated if you have leg cramps or low back pain.  Visit your dentist if you have not gone yet during your pregnancy. Use a soft toothbrush to brush your teeth and be gentle when you floss.  A sexual relationship may be continued unless your caregiver directs you otherwise.  Continue to go to all your prenatal visits as directed by your caregiver. SEEK MEDICAL CARE IF:   You have dizziness.  You have mild pelvic cramps, pelvic pressure, or nagging pain in the abdominal area.  You have persistent nausea, vomiting, or diarrhea.  You have a bad smelling vaginal discharge.  You have pain with urination. SEEK IMMEDIATE MEDICAL CARE IF:   You have a fever.  You are leaking fluid from your vagina.  You have spotting or bleeding from your vagina.  You have severe abdominal cramping or pain.  You have rapid weight gain or loss.  You have shortness of breath with chest pain.  You notice sudden or extreme swelling of your face, hands, ankles, feet, or legs.  You have not felt your baby move in over an hour.  You have severe headaches that do not go away with medicine.  You have vision changes. Document Released: 09/28/2001 Document Revised: 06/06/2013 Document Reviewed: 12/05/2012 ExitCare Patient Information 2014 [employer].  

## 2014-02-01 LAB — MATERNAL SCREEN, INTEGRATED #2
AFP MOM MAT SCREEN: 1.1
AFP, Serum: 47.6 ng/mL
Age risk Down Syndrome: 1:1100 {titer}
CALCULATED GESTATIONAL AGE MAT SCREEN: 16.9
Crown Rump Length: 67.7 mm
Estriol Mom: 1.28
Estriol, Free: 1.46 ng/mL
HCG, MOM MAT SCREEN: 0.18
HCG, SERUM MAT SCREEN: 6.1 [IU]/mL
INHIBIN A DIMERIC MAT SCREEN: 206 pg/mL
Inhibin A MoM: 1.09
MSS Down Syndrome: <1:5000 {titer}
NT MOM MAT SCREEN: 0.96
NUCHAL TRANSLUCENCY MAT SCREEN 2: 1.45 mm
Number of fetuses: 1
PAPP-A MOM MAT SCREEN: 0.33
PAPP-A: 442 ng/mL
Rish for ONTD: <1:5000 {titer}

## 2014-02-02 ENCOUNTER — Encounter: Payer: Self-pay | Admitting: Women's Health

## 2014-02-11 ENCOUNTER — Telehealth: Payer: Self-pay

## 2014-02-11 NOTE — Telephone Encounter (Signed)
Pt c/o earache, "stopped up nose," sore throat, headache, 101 temp. Pt states thinks is related to allergies. Pt informed to gargle with warm salt water, lozenges,  Claritin or Zyrtec, can take tylenol for fever,. If no improvement call our office back. Pt verbalized understanding.

## 2014-02-27 ENCOUNTER — Other Ambulatory Visit: Payer: Medicaid Other

## 2014-02-27 ENCOUNTER — Encounter: Payer: Medicaid Other | Admitting: Women's Health

## 2014-02-28 ENCOUNTER — Encounter: Payer: Self-pay | Admitting: Obstetrics and Gynecology

## 2014-02-28 ENCOUNTER — Ambulatory Visit (INDEPENDENT_AMBULATORY_CARE_PROVIDER_SITE_OTHER): Payer: Medicaid Other | Admitting: Obstetrics and Gynecology

## 2014-02-28 ENCOUNTER — Ambulatory Visit (INDEPENDENT_AMBULATORY_CARE_PROVIDER_SITE_OTHER): Payer: Medicaid Other

## 2014-02-28 ENCOUNTER — Other Ambulatory Visit: Payer: Self-pay | Admitting: Women's Health

## 2014-02-28 VITALS — BP 110/64 | Wt 121.4 lb

## 2014-02-28 DIAGNOSIS — Z331 Pregnant state, incidental: Secondary | ICD-10-CM

## 2014-02-28 DIAGNOSIS — Z3480 Encounter for supervision of other normal pregnancy, unspecified trimester: Secondary | ICD-10-CM

## 2014-02-28 DIAGNOSIS — O09299 Supervision of pregnancy with other poor reproductive or obstetric history, unspecified trimester: Secondary | ICD-10-CM

## 2014-02-28 DIAGNOSIS — Z1389 Encounter for screening for other disorder: Secondary | ICD-10-CM

## 2014-02-28 DIAGNOSIS — Z3490 Encounter for supervision of normal pregnancy, unspecified, unspecified trimester: Secondary | ICD-10-CM

## 2014-02-28 LAB — POCT URINALYSIS DIPSTICK
Blood, UA: NEGATIVE
GLUCOSE UA: NEGATIVE
KETONES UA: NEGATIVE
LEUKOCYTES UA: NEGATIVE
NITRITE UA: NEGATIVE
Protein, UA: NEGATIVE

## 2014-02-28 NOTE — Progress Notes (Signed)
[redacted]w[redacted]d. A4T3M4. No complaints. No concerns.   TECHNICIAN COMMENTS: U/S(21+1wks)-vtx active fetus, meas c/w dates, fluid wnl, anterior Gr 0 placenta, cx appears closed (3.7cm), bilateral adnexa appears wnl, FHR- 142 bpm, no major abnl noted, female fetus

## 2014-02-28 NOTE — Progress Notes (Signed)
U/S(21+1wks)-vtx active fetus, meas c/w dates, fluid wnl, anterior Gr 0 placenta, cx appears closed (3.7cm), bilateral adnexa appears wnl, FHR- 142 bpm, no major abnl noted, female fetus

## 2014-03-12 ENCOUNTER — Encounter: Payer: Self-pay | Admitting: Women's Health

## 2014-03-28 ENCOUNTER — Ambulatory Visit (INDEPENDENT_AMBULATORY_CARE_PROVIDER_SITE_OTHER): Payer: Medicaid Other | Admitting: Advanced Practice Midwife

## 2014-03-28 ENCOUNTER — Encounter: Payer: Self-pay | Admitting: Advanced Practice Midwife

## 2014-03-28 VITALS — BP 110/50 | Wt 134.5 lb

## 2014-03-28 DIAGNOSIS — Z331 Pregnant state, incidental: Secondary | ICD-10-CM

## 2014-03-28 DIAGNOSIS — Z3480 Encounter for supervision of other normal pregnancy, unspecified trimester: Secondary | ICD-10-CM

## 2014-03-28 DIAGNOSIS — Z3490 Encounter for supervision of normal pregnancy, unspecified, unspecified trimester: Secondary | ICD-10-CM

## 2014-03-28 DIAGNOSIS — Z1389 Encounter for screening for other disorder: Secondary | ICD-10-CM

## 2014-03-28 LAB — POCT URINALYSIS DIPSTICK
Blood, UA: NEGATIVE
GLUCOSE UA: NEGATIVE
Ketones, UA: NEGATIVE
Leukocytes, UA: NEGATIVE
NITRITE UA: NEGATIVE
Protein, UA: NEGATIVE

## 2014-03-28 NOTE — Patient Instructions (Signed)
1. Before your test, do not eat or drink anything for 8-10 hours prior to your  appointment (a small amount of water is allowed and you may take any medicines you normally take). Be sure to drink lots of water the day before. 2. When you arrive, your blood will be drawn for a 'fasting' blood sugar level.  Then you will be given a sweetened carbonated beverage to drink. You should  complete drinking this beverage within five minutes. After finishing the  beverage, you will have your blood drawn exactly 1 and 2 hours later. Having  your blood drawn on time is an important part of this test. A total of three blood  samples will be done. 3. The test takes approximately 2  hours. During the test, do not have anything to  eat or drink. Do not smoke, chew gum (not even sugarless gum) or use breath mints.  4. During the test you should remain close by and seated as much as possible and  avoid walking around. You may want to bring a book or something else to  occupy your time.  5. After your test, you may eat and drink as normal. You may want to bring a snack  to eat after the test is finished. Your provider will advise you as to the results of  this test and any follow-up if necessary  You will also be retested for syphilis, HIV and blood levels (anemia):  You were already tested in the first trimester, but [place] recommends retesting.  Additionally, you will be tested for Type 2 Herpes. MOST people do not know that they have genital herpes, as only around 15% of people have outbreaks.  However, it is still transmittable to other people, including the baby (but only during the birth).  If you test positive for Type 2 Herpes, we place you on a medicine called acyclovir the last 6 weeks of your pregnancy to prevent transmission of the virus to the baby during the birth.    If your sugar test is positive for gestational diabetes, you will be given an phone call and further instructions discussed.   We typically do not call patients with positive herpes results, but will discuss it at your next appointment.  If you wish to know all of your test results before your next appointment, feel free to call the office, or look up your test results on Mychart.  (The range that the lab uses for normal values of the sugar test are not necessarily the range that is used for pregnant women; if your results are within the range, they are definitely normal.  However, if a value is deemed "high" by the lab, it may not be too high for a pregnant woman.  We will need to discuss the normal range if your value(s) fall in the "high" category).

## 2014-03-28 NOTE — Progress Notes (Signed)
[id number] [redacted]w[redacted]d Estimated Date of Delivery: 07/10/14  Last menstrual period 10/03/2013, not currently breastfeeding.   BP weight and urine results all reviewed and noted.  Please refer to the obstetrical flow sheet for the fundal height and fetal heart rate documentation:  Patient reports good fetal movement, denies any bleeding and no rupture of membranes symptoms or regular contractions. Patient is without complaints. All questions were answered.  Plan:  Continued routine obstetrical care,   Follow up in 3 weeks for OB appointment, PN2/Low-risk ob appt

## 2014-04-22 ENCOUNTER — Other Ambulatory Visit: Payer: Medicaid Other

## 2014-04-22 ENCOUNTER — Encounter: Payer: Medicaid Other | Admitting: Obstetrics & Gynecology

## 2014-04-25 ENCOUNTER — Other Ambulatory Visit: Payer: Medicaid Other

## 2014-04-25 ENCOUNTER — Ambulatory Visit (INDEPENDENT_AMBULATORY_CARE_PROVIDER_SITE_OTHER): Payer: Medicaid Other | Admitting: Obstetrics and Gynecology

## 2014-04-25 VITALS — BP 100/62 | Wt 140.5 lb

## 2014-04-25 DIAGNOSIS — Z3480 Encounter for supervision of other normal pregnancy, unspecified trimester: Secondary | ICD-10-CM

## 2014-04-25 DIAGNOSIS — Z331 Pregnant state, incidental: Secondary | ICD-10-CM

## 2014-04-25 DIAGNOSIS — Z3483 Encounter for supervision of other normal pregnancy, third trimester: Secondary | ICD-10-CM

## 2014-04-25 DIAGNOSIS — F17200 Nicotine dependence, unspecified, uncomplicated: Secondary | ICD-10-CM

## 2014-04-25 DIAGNOSIS — Z1389 Encounter for screening for other disorder: Secondary | ICD-10-CM

## 2014-04-25 LAB — POCT URINALYSIS DIPSTICK
Glucose, UA: NEGATIVE
KETONES UA: NEGATIVE
LEUKOCYTES UA: NEGATIVE
Nitrite, UA: NEGATIVE
Protein, UA: NEGATIVE
RBC UA: NEGATIVE

## 2014-04-25 LAB — CBC
HCT: 31.3 % — ABNORMAL LOW (ref 36.0–46.0)
Hemoglobin: 10.3 g/dL — ABNORMAL LOW (ref 12.0–15.0)
MCH: 30.5 pg (ref 26.0–34.0)
MCHC: 32.9 g/dL (ref 30.0–36.0)
MCV: 92.6 fL (ref 78.0–100.0)
PLATELETS: 183 10*3/uL (ref 150–400)
RBC: 3.38 MIL/uL — ABNORMAL LOW (ref 3.87–5.11)
RDW: 14 % (ref 11.5–15.5)
WBC: 12.6 10*3/uL — AB (ref 4.0–10.5)

## 2014-04-25 NOTE — Progress Notes (Signed)
Pt denies any problems or concerns at this time.  

## 2014-04-25 NOTE — Progress Notes (Signed)
[id number] [redacted]w[redacted]d Estimated Date of Delivery: 07/10/14  Blood pressure 100/62, weight 140 lb 8 oz (63.73 kg), last menstrual period 10/03/2013, not currently breastfeeding.   BP weight and urine results all reviewed and noted.  Please refer to the obstetrical flow sheet for the fundal height and fetal heart rate documentation:  Patient reports good fetal movement, denies any bleeding and no rupture of membranes symptoms or regular contractions. Patient is without complaints. All questions were answered.  Plan:  Continued routine obstetrical care,   Follow up in 3 weeks for OB appointment,

## 2014-04-26 ENCOUNTER — Encounter: Payer: Medicaid Other | Admitting: Obstetrics & Gynecology

## 2014-04-26 ENCOUNTER — Encounter: Payer: Self-pay | Admitting: Obstetrics and Gynecology

## 2014-04-26 ENCOUNTER — Other Ambulatory Visit: Payer: Medicaid Other

## 2014-04-26 LAB — HSV 2 ANTIBODY, IGG: HSV 2 Glycoprotein G Ab, IgG: <0.1 IV

## 2014-04-26 LAB — GLUCOSE TOLERANCE, 2 HOURS W/ 1HR
Glucose, 1 hour: 105 mg/dL (ref 70–170)
Glucose, 2 hour: 88 mg/dL (ref 70–139)
Glucose, Fasting: 70 mg/dL (ref 70–99)

## 2014-04-26 LAB — HIV ANTIBODY (ROUTINE TESTING W REFLEX): HIV: NONREACTIVE

## 2014-04-26 LAB — ANTIBODY SCREEN: Antibody Screen: NEGATIVE

## 2014-04-26 LAB — RPR

## 2014-05-06 ENCOUNTER — Telehealth: Payer: Self-pay | Admitting: Obstetrics and Gynecology

## 2014-05-06 NOTE — Telephone Encounter (Signed)
Pt states that is having cramps, no vaginal bleeding and good FM, pt is concerned because its the same cramping feeling she had with her first pregnancy before she delivered.  Advised her to push fluids, rest on Rt side and take tylenol, if no improvement or symptoms worsen or new symptoms develop call us back and we will see about getting her worked in sooner than her scheduled visit on the 31st.  Pt verbalized understanding.

## 2014-05-16 ENCOUNTER — Encounter: Payer: Self-pay | Admitting: Obstetrics & Gynecology

## 2014-05-16 ENCOUNTER — Ambulatory Visit (INDEPENDENT_AMBULATORY_CARE_PROVIDER_SITE_OTHER): Payer: Medicaid Other | Admitting: Obstetrics & Gynecology

## 2014-05-16 VITALS — BP 110/70 | Wt 146.0 lb

## 2014-05-16 DIAGNOSIS — Z3480 Encounter for supervision of other normal pregnancy, unspecified trimester: Secondary | ICD-10-CM

## 2014-05-16 DIAGNOSIS — Z331 Pregnant state, incidental: Secondary | ICD-10-CM

## 2014-05-16 DIAGNOSIS — Z1389 Encounter for screening for other disorder: Secondary | ICD-10-CM

## 2014-05-16 DIAGNOSIS — O09219 Supervision of pregnancy with history of pre-term labor, unspecified trimester: Secondary | ICD-10-CM

## 2014-05-16 DIAGNOSIS — O09893 Supervision of other high risk pregnancies, third trimester: Secondary | ICD-10-CM

## 2014-05-16 DIAGNOSIS — O09213 Supervision of pregnancy with history of pre-term labor, third trimester: Principal | ICD-10-CM

## 2014-05-16 LAB — POCT URINALYSIS DIPSTICK
GLUCOSE UA: NEGATIVE
KETONES UA: NEGATIVE
Leukocytes, UA: NEGATIVE
NITRITE UA: NEGATIVE
Protein, UA: NEGATIVE
RBC UA: NEGATIVE

## 2014-05-16 NOTE — Progress Notes (Signed)
[id number] [redacted]w[redacted]d Estimated Date of Delivery: 07/10/14  Blood pressure 110/70, weight 146 lb (66.225 kg), last menstrual period 10/03/2013, not currently breastfeeding.   BP weight and urine results all reviewed and noted.  Please refer to the obstetrical flow sheet for the fundal height and fetal heart rate documentation:  Patient reports good fetal movement, denies any bleeding and no rupture of membranes symptoms or regular contractions. Patient is without complaints. All questions were answered.  Plan:  Continued routine obstetrical care,   Follow up in 2 weeks for OB appointment, routine

## 2014-05-21 ENCOUNTER — Encounter (HOSPITAL_COMMUNITY): Payer: Self-pay | Admitting: *Deleted

## 2014-05-21 ENCOUNTER — Inpatient Hospital Stay (HOSPITAL_COMMUNITY)
Admission: AD | Admit: 2014-05-21 | Discharge: 2014-05-21 | Disposition: A | Discharge status: Home / Self Care | Payer: Medicaid Other | Source: Ambulatory Visit | Attending: Obstetrics & Gynecology | Admitting: Obstetrics & Gynecology

## 2014-05-21 DIAGNOSIS — O99891 Other specified diseases and conditions complicating pregnancy: Secondary | ICD-10-CM | POA: Insufficient documentation

## 2014-05-21 DIAGNOSIS — R21 Rash and other nonspecific skin eruption: Secondary | ICD-10-CM | POA: Diagnosis not present

## 2014-05-21 DIAGNOSIS — O9989 Other specified diseases and conditions complicating pregnancy, childbirth and the puerperium: Principal | ICD-10-CM

## 2014-05-21 DIAGNOSIS — O4700 False labor before 37 completed weeks of gestation, unspecified trimester: Secondary | ICD-10-CM | POA: Diagnosis not present

## 2014-05-21 DIAGNOSIS — L293 Anogenital pruritus, unspecified: Secondary | ICD-10-CM | POA: Diagnosis present

## 2014-05-21 DIAGNOSIS — O99330 Smoking (tobacco) complicating pregnancy, unspecified trimester: Secondary | ICD-10-CM | POA: Insufficient documentation

## 2014-05-21 DIAGNOSIS — O479 False labor, unspecified: Secondary | ICD-10-CM

## 2014-05-21 LAB — URINALYSIS, ROUTINE W REFLEX MICROSCOPIC
Bilirubin Urine: NEGATIVE
Glucose, UA: NEGATIVE mg/dL
HGB URINE DIPSTICK: NEGATIVE
Ketones, ur: NEGATIVE mg/dL
Leukocytes, UA: NEGATIVE
Nitrite: NEGATIVE
Protein, ur: NEGATIVE mg/dL
SPECIFIC GRAVITY, URINE: 1.01 (ref 1.005–1.030)
Urobilinogen, UA: 1 mg/dL (ref 0.0–1.0)
pH: 7 (ref 5.0–8.0)

## 2014-05-21 LAB — WET PREP, GENITAL
Clue Cells Wet Prep HPF POC: NONE SEEN
TRICH WET PREP: NONE SEEN
YEAST WET PREP: NONE SEEN

## 2014-05-21 MED ORDER — NIFEDIPINE 10 MG PO CAPS: 10.0000 mg | ORAL_CAPSULE | ORAL | Status: DC | PRN

## 2014-05-21 MED ORDER — BETAMETHASONE SOD PHOS & ACET 6 (3-3) MG/ML IJ SUSP
12.0000 mg | Freq: Once | INTRAMUSCULAR | Status: AC
  Administered 2014-05-21: 12 mg via INTRAMUSCULAR
  Filled 2014-05-21: qty 2

## 2014-05-21 MED ORDER — NYSTATIN 100000 UNIT/GM EX POWD: 1.0000 g | Freq: Three times a day (TID) | CUTANEOUS | Status: DC

## 2014-05-21 MED ORDER — NIFEDIPINE 10 MG PO CAPS
10.0000 mg | ORAL_CAPSULE | ORAL | Status: DC | PRN
  Administered 2014-05-21: 10 mg via ORAL
  Filled 2014-05-21: qty 1

## 2014-05-21 MED ORDER — LACTATED RINGERS IV BOLUS (SEPSIS)
1000.0000 mL | Freq: Once | INTRAVENOUS | Status: AC
  Administered 2014-05-21: 1000 mL via INTRAVENOUS

## 2014-05-21 NOTE — MAU Note (Signed)
Pt reports vaginal pain and lower back pain since 2300. Denies bleeding or ROM. some pain with urination. Also reports some vaginal itching

## 2014-05-21 NOTE — MAU Provider Note (Signed)
Chief Complaint:  Back Pain and Vaginal Itching   First Provider Initiated Contact with Patient 05/21/14 [phone]      HPI: Lacey Hartman is a 18 y.o. [id number] at [redacted]w[redacted]d who presents to maternity admissions reporting vaginal swelling and pain. Reports for the past week she has had severe vaginal itching, which she has been scratching. Now vagina seems swollen and painful. Started when she started wearing scented panty liners for physiologic discharge. Intercourse painful.  Also endorses contractions every 5- 6 min since this AM. Reports inc in severity and frequency.  Denies leakage of fluid or vaginal bleeding. Good fetal movement.   Pregnancy Course:  H/o PTD at 36 wks, declined 17p Tobacco Abuse  Past Medical History: Past Medical History  Diagnosis Date  . Hard of hearing   . Asthma     inhaler prn    Past obstetric history: OB History  Gravida Para Term Preterm AB SAB TAB Ectopic Multiple Living  3 1 0 1 1 1 0 0 0 1     # Outcome Date GA Lbr Len/2nd Weight Sex Delivery Anes PTL Lv  3 CUR           2 SAB 08/2013          1 PRE 11/23/12 [redacted]w[redacted]d 05:41 / 00:10 5 lb 7.8 oz (2.489 kg) F SVD EPI  Y      Past Surgical History: Past Surgical History  Procedure Laterality Date  . No past surgeries       Family History: Family History  Problem Relation Age of Onset  . Cancer Maternal Grandmother   . Cancer Maternal Uncle     Social History: History  Substance Use Topics  . Smoking status: Current Every Day Smoker -- 0.50 packs/day for 5 years    Types: Cigarettes  . Smokeless tobacco: Never Used  . Alcohol Use: No    Allergies:  Allergies  Allergen Reactions  . Codeine Hives    & hallucinations    Meds:  Prescriptions prior to admission  Medication Sig Dispense Refill  . acetaminophen (TYLENOL) 500 MG tablet Take 500 mg by mouth every 6 (six) hours as needed for headache.       . Pediatric Multiple Vit-C-FA (FLINSTONES GUMMIES OMEGA-3 DHA) CHEW Chew by mouth.  Take 2 daily        ROS: Pertinent findings in history of present illness.  Physical Exam  Blood pressure 121/70, pulse 87, temperature 97.6 F (36.4 C), temperature source Oral, resp. rate 20, height 5\' 1"  (1.549 m), weight 147 lb (66.679 kg), last menstrual period 10/03/2013, SpO2 99.00%, not currently breastfeeding. GENERAL: Well-developed, well-nourished female in no acute distress.  HEENT: normocephalic HEART: normal rate RESP: normal effort ABDOMEN: Soft, non-tender, gravid appropriate for gestational age EXTREMITIES: Nontender, no edema NEURO: alert and oriented GU: Labia erythematous and inflammed, tender to touch. No ulcerations or lesions, but + excoriations SPECULUM EXAM: NEFG, physiologic discharge, no blood, cervix clean Dilation: 1 Effacement (%): Thick (high) Exam by:: Dr. Mancel Bale  FHT:  Baseline 130 , moderate variability, accelerations present, no decelerations Contractions: q  5 - 6 mins, then spaced to q 10 - 15 min after iVF and procadria   Labs: Results for orders placed during the hospital encounter of 05/21/14 (from the past 24 hour(s))  URINALYSIS, ROUTINE W REFLEX MICROSCOPIC     Status: None   Collection Time    05/21/14  6:01 AM      Result Value Ref Range  Color, Urine YELLOW  YELLOW   APPearance CLEAR  CLEAR   Specific Gravity, Urine 1.010  1.005 - 1.030   pH 7.0  5.0 - 8.0   Glucose, UA NEGATIVE  NEGATIVE mg/dL   Hgb urine dipstick NEGATIVE  NEGATIVE   Bilirubin Urine NEGATIVE  NEGATIVE   Ketones, ur NEGATIVE  NEGATIVE mg/dL   Protein, ur NEGATIVE  NEGATIVE mg/dL   Urobilinogen, UA 1.0  0.0 - 1.0 mg/dL   Nitrite NEGATIVE  NEGATIVE   Leukocytes, UA NEGATIVE  NEGATIVE  WET PREP, GENITAL     Status: Abnormal   Collection Time    05/21/14  6:50 AM      Result Value Ref Range   Yeast Wet Prep HPF POC NONE SEEN  NONE SEEN   Trich, Wet Prep NONE SEEN  NONE SEEN   Clue Cells Wet Prep HPF POC NONE SEEN  NONE SEEN   WBC, Wet Prep HPF POC FEW (*)  NONE SEEN    Imaging:  No results found.  MAU Course:  Pt presented to MAU with vaginal pain and swelling a/w pruritis x 1 wk. Likely contact dermatitis from scented panty liners vs candidiasis. PE more c/w interigo, will tx with nystatin powder.  Pt noted to have contractions q 5 -6 min upon arrival to MAU. Contractions were mild and cervix noted to be 1/t/h on SVE and SSE. Not in active labor. Given h/o PTD at 36 wks, gave BMX x 1. Also gave IVF and procardia x 1 w/ spacing of contractions to q 10 - 15 min.  Assessment: 1. Rash and nonspecific skin eruption   2. False labor     Plan: Discharge home Pre-term labor precautions and fetal kick counts Follow-up Information   Follow up with [facility] In 1 day. (for injection or , If symptoms worsen)    Contact information:   [place] [id number] [phone]       Medication List         acetaminophen 500 MG tablet  Commonly known as:  TYLENOL  Take 500 mg by mouth every 6 (six) hours as needed for headache.     FLINSTONES GUMMIES OMEGA-3 DHA Chew  Chew by mouth. Take 2 daily     NIFEdipine 10 MG capsule  Commonly known as:  PROCARDIA  Take 1 capsule (10 mg total) by mouth every 4 (four) hours as needed (contractions).        Josephine Cables, MD 05/21/2014 8:58 AM

## 2014-05-21 NOTE — MAU Note (Addendum)
PT   SATS SHE HAS HAD VAG SWELLING X1 WEEK.  GETS PNC- [facility].    NO VE.  IN OFFICE.  DENIES HSV AND MRSA.    FEELS SOME TIGHTENING  AND LOWER BACK HURTS.  AND HIPS HURT.       SAYS LAST SEX WAS  WEEKEND-  DRY AND RED PERINEUM.

## 2014-05-23 ENCOUNTER — Encounter: Payer: Self-pay | Admitting: Advanced Practice Midwife

## 2014-05-23 ENCOUNTER — Ambulatory Visit (INDEPENDENT_AMBULATORY_CARE_PROVIDER_SITE_OTHER): Payer: Medicaid Other | Admitting: Advanced Practice Midwife

## 2014-05-23 VITALS — BP 100/58 | Wt 148.0 lb

## 2014-05-23 DIAGNOSIS — Z3480 Encounter for supervision of other normal pregnancy, unspecified trimester: Secondary | ICD-10-CM

## 2014-05-23 DIAGNOSIS — Z331 Pregnant state, incidental: Secondary | ICD-10-CM

## 2014-05-23 DIAGNOSIS — Z3483 Encounter for supervision of other normal pregnancy, third trimester: Secondary | ICD-10-CM

## 2014-05-23 DIAGNOSIS — Z1389 Encounter for screening for other disorder: Secondary | ICD-10-CM

## 2014-05-23 LAB — POCT URINALYSIS DIPSTICK
GLUCOSE UA: NEGATIVE
Ketones, UA: NEGATIVE
LEUKOCYTES UA: NEGATIVE
Nitrite, UA: NEGATIVE
RBC UA: NEGATIVE

## 2014-05-23 NOTE — Progress Notes (Signed)
[id number] [redacted]w[redacted]d Estimated Date of Delivery: 07/10/14  Last menstrual period 10/03/2013, not currently breastfeeding.   BP weight and urine results all reviewed and noted.  Please refer to the obstetrical flow sheet for the fundal height and fetal heart rate documentation: Seen in MAU 2 days ago with yeast infection.  Given BMZ d/t q 5 minute ctx, cx 1/long. Came to office yesterday for 2nd dose and was turned away mistakenly by secretary.   Patient reports good fetal movement, denies any bleeding and no rupture of membranes symptoms.  Taking Procardia 10mg  q 6 hours prn contractions, takes it regularly Patient is without complaints. All questions were answered.  Plan:  Continued routine obstetrical care, no need for 2nd dose of BMZ: it is late and cx is long.  May continue Procardia prn contractions, for comfort if nothing else  Follow up in 2 weeks for OB appointment,

## 2014-05-23 NOTE — Progress Notes (Signed)
Pt states that she is still having some pressure, and irregular contractions. Pt staes that she still has swelling in her vagina as well. Pt did not receive 2nd injection.

## 2014-05-30 ENCOUNTER — Encounter: Payer: Medicaid Other | Admitting: Obstetrics & Gynecology

## 2014-06-06 ENCOUNTER — Ambulatory Visit (INDEPENDENT_AMBULATORY_CARE_PROVIDER_SITE_OTHER): Payer: Medicaid Other | Admitting: Advanced Practice Midwife

## 2014-06-06 ENCOUNTER — Encounter: Payer: Self-pay | Admitting: Advanced Practice Midwife

## 2014-06-06 VITALS — BP 120/76 | Wt 150.0 lb

## 2014-06-06 DIAGNOSIS — Z3480 Encounter for supervision of other normal pregnancy, unspecified trimester: Secondary | ICD-10-CM

## 2014-06-06 DIAGNOSIS — Z3483 Encounter for supervision of other normal pregnancy, third trimester: Secondary | ICD-10-CM

## 2014-06-06 DIAGNOSIS — Z1389 Encounter for screening for other disorder: Secondary | ICD-10-CM

## 2014-06-06 DIAGNOSIS — Z331 Pregnant state, incidental: Secondary | ICD-10-CM

## 2014-06-06 LAB — POCT URINALYSIS DIPSTICK
Blood, UA: NEGATIVE
GLUCOSE UA: NEGATIVE
Ketones, UA: NEGATIVE
LEUKOCYTES UA: NEGATIVE
NITRITE UA: NEGATIVE
PROTEIN UA: NEGATIVE

## 2014-06-06 NOTE — Progress Notes (Signed)
Pt states that she is having a lot of pressure, and irregular contractions.

## 2014-06-06 NOTE — Progress Notes (Signed)
[id number] [redacted]w[redacted]d Estimated Date of Delivery: 07/10/14  Last menstrual period 10/03/2013, not currently breastfeeding.   BP weight and urine results all reviewed and noted.  Please refer to the obstetrical flow sheet for the fundal height and fetal heart rate documentation:  Patient reports good fetal movement, denies any bleeding and no rupture of membranes symptoms or regular contractions. Patient is without complaints. All questions were answered.  Plan:  Continued routine obstetrical care, may stop Procardia  Follow up in 1 weeks for OB appointment,

## 2014-06-08 ENCOUNTER — Encounter (HOSPITAL_COMMUNITY): Payer: Self-pay | Admitting: *Deleted

## 2014-06-08 ENCOUNTER — Inpatient Hospital Stay (HOSPITAL_COMMUNITY)
Admission: AD | Admit: 2014-06-08 | Discharge: 2014-06-08 | Disposition: A | Discharge status: Home / Self Care | Payer: Medicaid Other | Source: Ambulatory Visit | Attending: Obstetrics & Gynecology | Admitting: Obstetrics & Gynecology

## 2014-06-08 DIAGNOSIS — O4700 False labor before 37 completed weeks of gestation, unspecified trimester: Secondary | ICD-10-CM | POA: Insufficient documentation

## 2014-06-08 DIAGNOSIS — Z3483 Encounter for supervision of other normal pregnancy, third trimester: Secondary | ICD-10-CM

## 2014-06-08 DIAGNOSIS — O99330 Smoking (tobacco) complicating pregnancy, unspecified trimester: Secondary | ICD-10-CM | POA: Diagnosis not present

## 2014-06-08 LAB — URINALYSIS, ROUTINE W REFLEX MICROSCOPIC
Bilirubin Urine: NEGATIVE
Glucose, UA: NEGATIVE mg/dL
Hgb urine dipstick: NEGATIVE
KETONES UR: NEGATIVE mg/dL
LEUKOCYTES UA: NEGATIVE
Nitrite: NEGATIVE
PROTEIN: NEGATIVE mg/dL
Specific Gravity, Urine: 1.015 (ref 1.005–1.030)
UROBILINOGEN UA: 0.2 mg/dL (ref 0.0–1.0)
pH: 6 (ref 5.0–8.0)

## 2014-06-08 NOTE — MAU Provider Note (Signed)
Chief Complaint:  No chief complaint on file.   Lacey Hartman is a 18 y.o.  [phone] with IUP at [redacted]w[redacted]d presenting for contractions. She reports irregular ctx starting about 2pm today. They have persisted but have not gotten stronger or more freq. She denies any LOF or VB and continues to note good FM. She reports being check at [facility] recently and was told she was 2 cm. She has a history of PTL @ ~36 weeks and has been taking procardia prn (last taken ~2 days ago). She denies any abdominal pain or fevers.     OB History   Grav Para Term Preterm Abortions TAB SAB Ect Mult Living   3 1 0 1 1 0 1 0 0 1      Patient's last menstrual period was 10/03/2013.      Past Medical History  Diagnosis Date  . Hard of hearing   . Asthma     inhaler prn    Past Surgical History  Procedure Laterality Date  . No past surgeries      Family History  Problem Relation Age of Onset  . Cancer Maternal Grandmother   . Cancer Maternal Uncle     History  Substance Use Topics  . Smoking status: Current Every Day Smoker -- 0.50 packs/day for 5 years    Types: Cigarettes  . Smokeless tobacco: Never Used  . Alcohol Use: No      Allergies  Allergen Reactions  . Codeine Hives    & hallucinations    Prescriptions prior to admission  Medication Sig Dispense Refill  . acetaminophen (TYLENOL) 500 MG tablet Take 500 mg by mouth every 6 (six) hours as needed for headache.       Marland Kitchen NIFEdipine (PROCARDIA) 10 MG capsule Take 1 capsule (10 mg total) by mouth every 4 (four) hours as needed (contractions).  30 capsule  3  . nystatin (MYCOSTATIN/NYSTOP) 100000 UNIT/GM POWD Apply 1 g topically 3 (three) times daily.  1000 g  0  . Pediatric Multiple Vit-C-FA (FLINSTONES GUMMIES OMEGA-3 DHA) CHEW Chew by mouth. Take 2 daily        Review of Systems - Negative except for what is mentioned in HPI.  Physical Exam  Blood pressure 128/73, pulse 105, temperature 97.4 F (36.3 C), temperature source Oral,  resp. rate 18, height 5\' 1"  (1.549 m), weight 69.4 kg (153 lb), last menstrual period 10/03/2013, not currently breastfeeding. GENERAL: Well-developed, well-nourished female in no acute distress.  ABDOMEN: Soft, nontender, nondistended, gravid.  EXTREMITIES: Nontender, no edema, 2+ distal pulses. FHT:  Baseline rate 120 bpm   Variability moderate  Accelerations present   Decelerations none Contractions: Every 11 mins Dilation: 1.5 Effacement (%): 50 Cervical Position: Posterior Station: -2 Presentation: Vertex Exam by:: DR Berkley Harvey    Labs: No results found for this or any previous visit (from the past 24 hour(s)).  Imaging Studies:  No results found.  Assessment: Lacey Hartman is  18 y.o. [phone] at [redacted]w[redacted]d presents with Ctx w/o LOF. She was observed in MAU and discharged home after no cervical change was noted after 1 hour. FHT is Cat 1.   Plan: Early labor vs Braxton hicks ctx - Follow-up at next scheduled OB visit  Phill Myron 8/22/20154:24 AM  I have participated in the care of this patient and I agree with the above. Serita Grammes CNM 9:09 AM 06/08/2014

## 2014-06-08 NOTE — MAU Note (Signed)
PT SAYS SHE STARTED HURTING BAD [id number].   WAS IN OFFICE  ON Thursday-  2 CM.      DENIES HSV AND MRSA.

## 2014-06-08 NOTE — Discharge Instructions (Signed)
Braxton Hicks Contractions °Contractions of the uterus can occur throughout pregnancy. Contractions are not always a sign that you are in labor.  °WHAT ARE BRAXTON HICKS CONTRACTIONS?  °Contractions that occur before labor are called Braxton Hicks contractions, or false labor. Toward the end of pregnancy (32-34 weeks), these contractions can develop more often and may become more forceful. This is not true labor because these contractions do not result in opening (dilatation) and thinning of the cervix. They are sometimes difficult to tell apart from true labor because these contractions can be forceful and people have different pain tolerances. You should not feel embarrassed if you go to the hospital with false labor. Sometimes, the only way to tell if you are in true labor is for your health care provider to look for changes in the cervix. °If there are no prenatal problems or other health problems associated with the pregnancy, it is completely safe to be sent home with false labor and await the onset of true labor. °HOW CAN YOU TELL THE DIFFERENCE BETWEEN TRUE AND FALSE LABOR? °False Labor °· The contractions of false labor are usually shorter and not as hard as those of true labor.   °· The contractions are usually irregular.   °· The contractions are often felt in the front of the lower abdomen and in the groin.   °· The contractions may go away when you walk around or change positions while lying down.   °· The contractions get weaker and are shorter lasting as time goes on.   °· The contractions do not usually become progressively stronger, regular, and closer together as with true labor.   °True Labor °· Contractions in true labor last 30-70 seconds, become very regular, usually become more intense, and increase in frequency.   °· The contractions do not go away with walking.   °· The discomfort is usually felt in the top of the uterus and spreads to the lower abdomen and low back.   °· True labor can be  determined by your health care provider with an exam. This will show that the cervix is dilating and getting thinner.   °WHAT TO REMEMBER °· Keep up with your usual exercises and follow other instructions given by your health care provider.   °· Take medicines as directed by your health care provider.   °· Keep your regular prenatal appointments.   °· Eat and drink lightly if you think you are going into labor.   °· If Braxton Hicks contractions are making you uncomfortable:   °¨ Change your position from lying down or resting to walking, or from walking to resting.   °¨ Sit and rest in a tub of warm water.   °¨ Drink 2-3 glasses of water. Dehydration may cause these contractions.   °¨ Do slow and deep breathing several times an hour.   °WHEN SHOULD I SEEK IMMEDIATE MEDICAL CARE? °Seek immediate medical care if: °· Your contractions become stronger, more regular, and closer together.   °· You have fluid leaking or gushing from your vagina.   °· You have a fever.   °· You pass blood-tinged mucus.   °· You have vaginal bleeding.   °· You have continuous abdominal pain.   °· You have low back pain that you never had before.   °· You feel your baby's head pushing down and causing pelvic pressure.   °· Your baby is not moving as much as it used to.   °Document Released: 10/04/2005 Document Revised: 10/09/2013 Document Reviewed: 07/16/2013 °[employer]® Patient Information ©2015 [employer]. This information is not intended to replace advice given to you by your health care   provider. Make sure you discuss any questions you have with your health care provider. ° °

## 2014-06-13 ENCOUNTER — Ambulatory Visit (INDEPENDENT_AMBULATORY_CARE_PROVIDER_SITE_OTHER): Payer: Medicaid Other | Admitting: Obstetrics & Gynecology

## 2014-06-13 ENCOUNTER — Encounter: Payer: Self-pay | Admitting: Obstetrics & Gynecology

## 2014-06-13 VITALS — BP 110/70 | Wt 155.0 lb

## 2014-06-13 DIAGNOSIS — Z331 Pregnant state, incidental: Secondary | ICD-10-CM

## 2014-06-13 DIAGNOSIS — Z3483 Encounter for supervision of other normal pregnancy, third trimester: Secondary | ICD-10-CM

## 2014-06-13 DIAGNOSIS — Z1389 Encounter for screening for other disorder: Secondary | ICD-10-CM

## 2014-06-13 DIAGNOSIS — Z3480 Encounter for supervision of other normal pregnancy, unspecified trimester: Secondary | ICD-10-CM

## 2014-06-13 LAB — POCT URINALYSIS DIPSTICK
Glucose, UA: NEGATIVE
Ketones, UA: NEGATIVE
LEUKOCYTES UA: NEGATIVE
Nitrite, UA: NEGATIVE
PROTEIN UA: NEGATIVE
RBC UA: NEGATIVE

## 2014-06-13 NOTE — Addendum Note (Signed)
Addended by: Doyne Keel on: 06/13/2014 02:50 PM   Modules accepted: Orders

## 2014-06-13 NOTE — Progress Notes (Signed)
[id number] [redacted]w[redacted]d Estimated Date of Delivery: 07/10/14  Blood pressure 110/70, weight 155 lb (70.308 kg), last menstrual period 10/03/2013, not currently breastfeeding.   BP weight and urine results all reviewed and noted.  Please refer to the obstetrical flow sheet for the fundal height and fetal heart rate documentation:  Patient reports good fetal movement, denies any bleeding and no rupture of membranes symptoms or regular contractions. Patient is without complaints. All questions were answered.  Plan:  Continued routine obstetrical care,   Follow up in 1 weeks for OB appointment, routine

## 2014-06-18 ENCOUNTER — Telehealth: Payer: Self-pay | Admitting: Obstetrics & Gynecology

## 2014-06-18 NOTE — Telephone Encounter (Signed)
Pt c/o lower abdominal pressure, shooting pain down back of legs. Informed pt to push fluids, rest as much as possible, take tylenol. Pt states no vaginal bleeding, +FM, no gush of fluids. Informed pt could be sciatica. Pt to call our office back if no improvement

## 2014-06-20 ENCOUNTER — Encounter: Payer: Self-pay | Admitting: Obstetrics & Gynecology

## 2014-06-20 ENCOUNTER — Ambulatory Visit (INDEPENDENT_AMBULATORY_CARE_PROVIDER_SITE_OTHER): Payer: Medicaid Other | Admitting: Obstetrics & Gynecology

## 2014-06-20 VITALS — BP 120/70 | Wt 156.0 lb

## 2014-06-20 DIAGNOSIS — Z3483 Encounter for supervision of other normal pregnancy, third trimester: Secondary | ICD-10-CM

## 2014-06-20 DIAGNOSIS — Z3480 Encounter for supervision of other normal pregnancy, unspecified trimester: Secondary | ICD-10-CM

## 2014-06-20 DIAGNOSIS — Z3685 Encounter for antenatal screening for Streptococcus B: Secondary | ICD-10-CM

## 2014-06-20 DIAGNOSIS — Z331 Pregnant state, incidental: Secondary | ICD-10-CM

## 2014-06-20 DIAGNOSIS — Z1159 Encounter for screening for other viral diseases: Secondary | ICD-10-CM

## 2014-06-20 LAB — OB RESULTS CONSOLE GC/CHLAMYDIA
CHLAMYDIA, DNA PROBE: NEGATIVE
Gonorrhea: NEGATIVE

## 2014-06-20 NOTE — Addendum Note (Signed)
Addended by: Doyne Keel on: 06/20/2014 02:31 PM   Modules accepted: Orders

## 2014-06-20 NOTE — Progress Notes (Signed)
[id number] [redacted]w[redacted]d Estimated Date of Delivery: 07/10/14  Blood pressure 120/70, weight 156 lb (70.761 kg), last menstrual period 10/03/2013, not currently breastfeeding.   BP weight and urine results all reviewed and noted.  Please refer to the obstetrical flow sheet for the fundal height and fetal heart rate documentation:  Patient reports good fetal movement, denies any bleeding and no rupture of membranes symptoms or regular contractions. Patient is without complaints. All questions were answered.  Plan:  Continued routine obstetrical care,   Follow up in 1 weeks for OB appointment, routine  GBS done today

## 2014-06-21 LAB — GC/CHLAMYDIA PROBE AMP
CT Probe RNA: NEGATIVE
GC PROBE AMP APTIMA: NEGATIVE

## 2014-06-21 LAB — STREP B DNA PROBE: GBSP: NOT DETECTED

## 2014-06-26 ENCOUNTER — Ambulatory Visit (INDEPENDENT_AMBULATORY_CARE_PROVIDER_SITE_OTHER): Payer: Medicaid Other | Admitting: Advanced Practice Midwife

## 2014-06-26 VITALS — BP 110/66 | Wt 160.0 lb

## 2014-06-26 DIAGNOSIS — Z3480 Encounter for supervision of other normal pregnancy, unspecified trimester: Secondary | ICD-10-CM

## 2014-06-26 DIAGNOSIS — Z3483 Encounter for supervision of other normal pregnancy, third trimester: Secondary | ICD-10-CM

## 2014-06-26 NOTE — Progress Notes (Signed)
[id number] [redacted]w[redacted]d Estimated Date of Delivery: 07/10/14  Blood pressure 110/66, weight 160 lb (72.576 kg), last menstrual period 10/03/2013, not currently breastfeeding.   BP weight and urine results all reviewed and noted.  Please refer to the obstetrical flow sheet for the fundal height and fetal heart rate documentation:  Patient reports good fetal movement, denies any bleeding and no rupture of membranes symptoms or regular contractions. Patient is without complaints. All questions were answered.  Plan:  Continued routine obstetrical care,   Follow up in 1 weeks for OB appointment,

## 2014-07-02 ENCOUNTER — Ambulatory Visit (INDEPENDENT_AMBULATORY_CARE_PROVIDER_SITE_OTHER): Payer: Medicaid Other | Admitting: Women's Health

## 2014-07-02 ENCOUNTER — Encounter: Payer: Self-pay | Admitting: Women's Health

## 2014-07-02 VITALS — BP 108/70 | Wt 162.0 lb

## 2014-07-02 DIAGNOSIS — Z3480 Encounter for supervision of other normal pregnancy, unspecified trimester: Secondary | ICD-10-CM

## 2014-07-02 DIAGNOSIS — Z1389 Encounter for screening for other disorder: Secondary | ICD-10-CM

## 2014-07-02 DIAGNOSIS — Z331 Pregnant state, incidental: Secondary | ICD-10-CM

## 2014-07-02 LAB — POCT URINALYSIS DIPSTICK
Blood, UA: NEGATIVE
GLUCOSE UA: NEGATIVE
KETONES UA: NEGATIVE
Leukocytes, UA: NEGATIVE
NITRITE UA: NEGATIVE
Protein, UA: NEGATIVE

## 2014-07-02 NOTE — Patient Instructions (Addendum)
Robitussin (regular), cough drops, warm salt water gargles for sore throat Call us if not getting any better or if getting worse  Call the office [phone]) or go to [employer] if:  You begin to have strong, frequent contractions  Your water breaks.  Sometimes it is a big gush of fluid, sometimes it is just a trickle that keeps getting your panties wet or running down your legs  You have vaginal bleeding.  It is normal to have a small amount of spotting if your cervix was checked.   You don't feel your baby moving like normal.  If you don't, get you something to eat and drink and lay down and focus on feeling your baby move.  You should feel at least 10 movements in 2 hours.  If you don't, you should call the office or go to [facility] Contractions Contractions of the uterus can occur throughout pregnancy. Contractions are not always a sign that you are in labor.  WHAT ARE BRAXTON HICKS CONTRACTIONS?  Contractions that occur before labor are called Braxton Hicks contractions, or false labor. Toward the end of pregnancy (32-34 weeks), these contractions can develop more often and may become more forceful. This is not true labor because these contractions do not result in opening (dilatation) and thinning of the cervix. They are sometimes difficult to tell apart from true labor because these contractions can be forceful and people have different pain tolerances. You should not feel embarrassed if you go to the hospital with false labor. Sometimes, the only way to tell if you are in true labor is for your health care provider to look for changes in the cervix. If there are no prenatal problems or other health problems associated with the pregnancy, it is completely safe to be sent home with false labor and await the onset of true labor. HOW CAN YOU TELL THE DIFFERENCE BETWEEN TRUE AND FALSE LABOR? False Labor  The contractions of false labor are usually shorter and not  as hard as those of true labor.   The contractions are usually irregular.   The contractions are often felt in the front of the lower abdomen and in the groin.   The contractions may go away when you walk around or change positions while lying down.   The contractions get weaker and are shorter lasting as time goes on.   The contractions do not usually become progressively stronger, regular, and closer together as with true labor.  True Labor  Contractions in true labor last 30-70 seconds, become very regular, usually become more intense, and increase in frequency.   The contractions do not go away with walking.   The discomfort is usually felt in the top of the uterus and spreads to the lower abdomen and low back.   True labor can be determined by your health care provider with an exam. This will show that the cervix is dilating and getting thinner.  WHAT TO REMEMBER  Keep up with your usual exercises and follow other instructions given by your health care provider.   Take medicines as directed by your health care provider.   Keep your regular prenatal appointments.   Eat and drink lightly if you think you are going into labor.   If Braxton Hicks contractions are making you uncomfortable:   Change your position from lying down or resting to walking, or from walking to resting.   Sit and rest in a tub of warm water.  Drink 2-3 glasses of water. Dehydration may cause these contractions.   Do slow and deep breathing several times an hour.  WHEN SHOULD I SEEK IMMEDIATE MEDICAL CARE? Seek immediate medical care if:  Your contractions become stronger, more regular, and closer together.   You have fluid leaking or gushing from your vagina.   You have a fever.   You pass blood-tinged mucus.   You have vaginal bleeding.   You have continuous abdominal pain.   You have low back pain that you never had before.   You feel your baby's head  pushing down and causing pelvic pressure.   Your baby is not moving as much as it used to.  Document Released: 10/04/2005 Document Revised: 10/09/2013 Document Reviewed: 07/16/2013 [facility] Patient Information 2015 [place], [place]. This information is not intended to replace advice given to you by your health care provider. Make sure you discuss any questions you have with your health care provider.

## 2014-07-02 NOTE — Progress Notes (Signed)
108 70  

## 2014-07-02 NOTE — Progress Notes (Signed)
Low-risk OB appointment [id number] [redacted]w[redacted]d Estimated Date of Delivery: 07/10/14 BP 108/70  Wt 162 lb (73.483 kg)  LMP 10/03/2013  BP, weight, and urine reviewed.  Refer to obstetrical flow sheet for FH & FHR.  Reports good fm.  Denies regular uc's, lof, vb, or uti s/s. Cold x 4d, nonproductive cough, sore throat, runny nose. No known fever, had to get fob to turn heat up the other day. No earaches.  HRRR, LCTAB. Throat slightly red, no exudate. Recommended robitussin, cough drops, warm salt water gargles- call us if not improving or getting worse.  Declines SVE Reviewed labor s/s, fkc. Plan:  Continue routine obstetrical care  F/U in 1wk for OB appointment

## 2014-07-04 ENCOUNTER — Encounter (HOSPITAL_COMMUNITY): Payer: Self-pay

## 2014-07-04 ENCOUNTER — Inpatient Hospital Stay (HOSPITAL_COMMUNITY)
Admission: AD | Admit: 2014-07-04 | Discharge: 2014-07-04 | Disposition: A | Discharge status: Home / Self Care | Payer: Medicaid Other | Source: Ambulatory Visit | Attending: Obstetrics & Gynecology | Admitting: Obstetrics & Gynecology

## 2014-07-04 ENCOUNTER — Telehealth: Payer: Self-pay | Admitting: Obstetrics and Gynecology

## 2014-07-04 NOTE — Discharge Instructions (Signed)
Third Trimester of Pregnancy The third trimester is from week 29 through week 42, months 7 through 9. The third trimester is a time when the fetus is growing rapidly. At the end of the ninth month, the fetus is about 20 inches in length and weighs 6-10 pounds.  BODY CHANGES Your body goes through many changes during pregnancy. The changes vary from woman to woman.   Your weight will continue to increase. You can expect to gain 25-35 pounds (11-16 kg) by the end of the pregnancy.  You may begin to get stretch marks on your hips, abdomen, and breasts.  You may urinate more often because the fetus is moving lower into your pelvis and pressing on your bladder.  You may develop or continue to have heartburn as a result of your pregnancy.  You may develop constipation because certain hormones are causing the muscles that push waste through your intestines to slow down.  You may develop hemorrhoids or swollen, bulging veins (varicose veins).  You may have pelvic pain because of the weight gain and pregnancy hormones relaxing your joints between the bones in your pelvis. Backaches may result from overexertion of the muscles supporting your posture.  You may have changes in your hair. These can include thickening of your hair, rapid growth, and changes in texture. Some women also have hair loss during or after pregnancy, or hair that feels dry or thin. Your hair will most likely return to normal after your baby is born.  Your breasts will continue to grow and be tender. A yellow discharge may leak from your breasts called colostrum.  Your belly button may stick out.  You may feel short of breath because of your expanding uterus.  You may notice the fetus "dropping," or moving lower in your abdomen.  You may have a bloody mucus discharge. This usually occurs a few days to a week before labor begins.  Your cervix becomes thin and soft (effaced) near your due date. WHAT TO EXPECT AT YOUR PRENATAL  EXAMS  You will have prenatal exams every 2 weeks until week 36. Then, you will have weekly prenatal exams. During a routine prenatal visit:  You will be weighed to make sure you and the fetus are growing normally.  Your blood pressure is taken.  Your abdomen will be measured to track your baby's growth.  The fetal heartbeat will be listened to.  Any test results from the previous visit will be discussed.  You may have a cervical check near your due date to see if you have effaced. At around 36 weeks, your caregiver will check your cervix. At the same time, your caregiver will also perform a test on the secretions of the vaginal tissue. This test is to determine if a type of bacteria, Group B streptococcus, is present. Your caregiver will explain this further. Your caregiver may ask you:  What your birth plan is.  How you are feeling.  If you are feeling the baby move.  If you have had any abnormal symptoms, such as leaking fluid, bleeding, severe headaches, or abdominal cramping.  If you have any questions. Other tests or screenings that may be performed during your third trimester include:  Blood tests that check for low iron levels (anemia).  Fetal testing to check the health, activity level, and growth of the fetus. Testing is done if you have certain medical conditions or if there are problems during the pregnancy. FALSE LABOR You may feel small, irregular contractions that   eventually go away. These are called Braxton Hicks contractions, or false labor. Contractions may last for hours, days, or even weeks before true labor sets in. If contractions come at regular intervals, intensify, or become painful, it is best to be seen by your caregiver.  SIGNS OF LABOR   Menstrual-like cramps.  Contractions that are 5 minutes apart or less.  Contractions that start on the top of the uterus and spread down to the lower abdomen and back.  A sense of increased pelvic pressure or back  pain.  A watery or bloody mucus discharge that comes from the vagina. If you have any of these signs before the 37th week of pregnancy, call your caregiver right away. You need to go to the hospital to get checked immediately. HOME CARE INSTRUCTIONS   Avoid all smoking, herbs, alcohol, and unprescribed drugs. These chemicals affect the formation and growth of the baby.  Follow your caregiver's instructions regarding medicine use. There are medicines that are either safe or unsafe to take during pregnancy.  Exercise only as directed by your caregiver. Experiencing uterine cramps is a good sign to stop exercising.  Continue to eat regular, healthy meals.  Wear a good support bra for breast tenderness.  Do not use hot tubs, steam rooms, or saunas.  Wear your seat belt at all times when driving.  Avoid raw meat, uncooked cheese, cat litter boxes, and soil used by cats. These carry germs that can cause birth defects in the baby.  Take your prenatal vitamins.  Try taking a stool softener (if your caregiver approves) if you develop constipation. Eat more high-fiber foods, such as fresh vegetables or fruit and whole grains. Drink plenty of fluids to keep your urine clear or pale yellow.  Take warm sitz baths to soothe any pain or discomfort caused by hemorrhoids. Use hemorrhoid cream if your caregiver approves.  If you develop varicose veins, wear support hose. Elevate your feet for 15 minutes, 3-4 times a day. Limit salt in your diet.  Avoid heavy lifting, wear low heal shoes, and practice good posture.  Rest a lot with your legs elevated if you have leg cramps or low back pain.  Visit your dentist if you have not gone during your pregnancy. Use a soft toothbrush to brush your teeth and be gentle when you floss.  A sexual relationship may be continued unless your caregiver directs you otherwise.  Do not travel far distances unless it is absolutely necessary and only with the approval  of your caregiver.  Take prenatal classes to understand, practice, and ask questions about the labor and delivery.  Make a trial run to the hospital.  Pack your hospital bag.  Prepare the baby's nursery.  Continue to go to all your prenatal visits as directed by your caregiver. SEEK MEDICAL CARE IF:  You are unsure if you are in labor or if your water has broken.  You have dizziness.  You have mild pelvic cramps, pelvic pressure, or nagging pain in your abdominal area.  You have persistent nausea, vomiting, or diarrhea.  You have a bad smelling vaginal discharge.  You have pain with urination. SEEK IMMEDIATE MEDICAL CARE IF:   You have a fever.  You are leaking fluid from your vagina.  You have spotting or bleeding from your vagina.  You have severe abdominal cramping or pain.  You have rapid weight loss or gain.  You have shortness of breath with chest pain.  You notice sudden or extreme swelling   of your face, hands, ankles, feet, or legs.  You have not felt your baby move in over an hour.  You have severe headaches that do not go away with medicine.  You have vision changes. Document Released: 09/28/2001 Document Revised: 10/09/2013 Document Reviewed: 12/05/2012 Exit[employer] Patient Information 2015 [employer]. This information is not intended to replace advice given to you by your health care provider. Make sure you discuss any questions you have with your health care provider.  

## 2014-07-04 NOTE — Progress Notes (Signed)
Called back regarding pt needing rule out rupture. Still in delivery

## 2014-07-04 NOTE — Telephone Encounter (Signed)
C/o " a lot of pressure, lower back pain, leaking clear fluids. Pt states bright red blood when she wiped this am." Pt informed to go to [facility] to be evaluated now. Pt verbalized understanding.

## 2014-07-04 NOTE — MAU Note (Signed)
Pt presents complaining of leaking of fluid since Tuesday am. States it is saturating an adult diaper with clear fluid and urine. Pt states she saw some blood and mucous when wiping this am. Complaining of increased pressure. Reports good fetal movement.

## 2014-07-04 NOTE — Progress Notes (Signed)
Notified of pt arrival in MAU and need for rule out rupture. Nurse answered for Lacey Hartman CNM and will notify

## 2014-07-05 ENCOUNTER — Inpatient Hospital Stay (HOSPITAL_COMMUNITY): Payer: Medicaid Other | Admitting: Anesthesiology

## 2014-07-05 ENCOUNTER — Encounter (HOSPITAL_COMMUNITY): Payer: Medicaid Other | Admitting: Anesthesiology

## 2014-07-05 ENCOUNTER — Encounter (HOSPITAL_COMMUNITY): Payer: Self-pay

## 2014-07-05 ENCOUNTER — Inpatient Hospital Stay (HOSPITAL_COMMUNITY)
Admission: AD | Admit: 2014-07-05 | Discharge: 2014-07-06 | DRG: 775 | Disposition: A | Discharge status: Home / Self Care | Payer: Medicaid Other | Source: Ambulatory Visit | Attending: Obstetrics & Gynecology | Admitting: Obstetrics & Gynecology

## 2014-07-05 DIAGNOSIS — D649 Anemia, unspecified: Secondary | ICD-10-CM

## 2014-07-05 DIAGNOSIS — O9902 Anemia complicating childbirth: Secondary | ICD-10-CM | POA: Diagnosis present

## 2014-07-05 DIAGNOSIS — O479 False labor, unspecified: Secondary | ICD-10-CM | POA: Diagnosis present

## 2014-07-05 DIAGNOSIS — O99334 Smoking (tobacco) complicating childbirth: Secondary | ICD-10-CM | POA: Diagnosis present

## 2014-07-05 DIAGNOSIS — O09213 Supervision of pregnancy with history of pre-term labor, third trimester: Secondary | ICD-10-CM

## 2014-07-05 DIAGNOSIS — Z3483 Encounter for supervision of other normal pregnancy, third trimester: Secondary | ICD-10-CM

## 2014-07-05 DIAGNOSIS — H9190 Unspecified hearing loss, unspecified ear: Secondary | ICD-10-CM

## 2014-07-05 DIAGNOSIS — J45909 Unspecified asthma, uncomplicated: Secondary | ICD-10-CM | POA: Diagnosis present

## 2014-07-05 DIAGNOSIS — IMO0001 Reserved for inherently not codable concepts without codable children: Secondary | ICD-10-CM

## 2014-07-05 LAB — CBC
HEMATOCRIT: 34.7 % — AB (ref 36.0–46.0)
Hemoglobin: 11.7 g/dL — ABNORMAL LOW (ref 12.0–15.0)
MCH: 30.5 pg (ref 26.0–34.0)
MCHC: 33.7 g/dL (ref 30.0–36.0)
MCV: 90.4 fL (ref 78.0–100.0)
Platelets: 196 10*3/uL (ref 150–400)
RBC: 3.84 MIL/uL — AB (ref 3.87–5.11)
RDW: 13.9 % (ref 11.5–15.5)
WBC: 15.5 10*3/uL — ABNORMAL HIGH (ref 4.0–10.5)

## 2014-07-05 LAB — RPR

## 2014-07-05 MED ORDER — FLEET ENEMA 7-19 GM/118ML RE ENEM: 1.0000 | ENEMA | RECTAL | Status: DC | PRN

## 2014-07-05 MED ORDER — FENTANYL 2.5 MCG/ML BUPIVACAINE 1/10 % EPIDURAL INFUSION (WH - ANES)
14.0000 mL/h | INTRAMUSCULAR | Status: DC | PRN
  Administered 2014-07-05: 14 mL/h via EPIDURAL
  Filled 2014-07-05: qty 125

## 2014-07-05 MED ORDER — PHENYLEPHRINE 40 MCG/ML (10ML) SYRINGE FOR IV PUSH (FOR BLOOD PRESSURE SUPPORT)
80.0000 ug | PREFILLED_SYRINGE | INTRAVENOUS | Status: DC | PRN
  Filled 2014-07-05: qty 2

## 2014-07-05 MED ORDER — IBUPROFEN 600 MG PO TABS
600.0000 mg | ORAL_TABLET | Freq: Four times a day (QID) | ORAL | Status: DC
  Administered 2014-07-05 – 2014-07-06 (×5): 600 mg via ORAL
  Filled 2014-07-05 (×5): qty 1

## 2014-07-05 MED ORDER — SENNOSIDES-DOCUSATE SODIUM 8.6-50 MG PO TABS
2.0000 | ORAL_TABLET | ORAL | Status: DC
  Administered 2014-07-05: 2 via ORAL
  Filled 2014-07-05: qty 2

## 2014-07-05 MED ORDER — PHENYLEPHRINE 40 MCG/ML (10ML) SYRINGE FOR IV PUSH (FOR BLOOD PRESSURE SUPPORT)
80.0000 ug | PREFILLED_SYRINGE | INTRAVENOUS | Status: DC | PRN
  Filled 2014-07-05: qty 10
  Filled 2014-07-05: qty 2

## 2014-07-05 MED ORDER — METHYLERGONOVINE MALEATE 0.2 MG/ML IJ SOLN: 0.2000 mg | INTRAMUSCULAR | Status: DC | PRN

## 2014-07-05 MED ORDER — SIMETHICONE 80 MG PO CHEW: 80.0000 mg | CHEWABLE_TABLET | ORAL | Status: DC | PRN

## 2014-07-05 MED ORDER — EPHEDRINE 5 MG/ML INJ
10.0000 mg | INTRAVENOUS | Status: DC | PRN
  Filled 2014-07-05: qty 2

## 2014-07-05 MED ORDER — OXYTOCIN BOLUS FROM INFUSION: 500.0000 mL | INTRAVENOUS | Status: DC

## 2014-07-05 MED ORDER — OXYTOCIN 40 UNITS IN LACTATED RINGERS INFUSION - SIMPLE MED
62.5000 mL/h | INTRAVENOUS | Status: DC
  Administered 2014-07-05: 62.5 mL/h via INTRAVENOUS
  Filled 2014-07-05: qty 1000

## 2014-07-05 MED ORDER — MEASLES, MUMPS & RUBELLA VAC ~~LOC~~ INJ
0.5000 mL | INJECTION | Freq: Once | SUBCUTANEOUS | Status: DC
  Filled 2014-07-05: qty 0.5

## 2014-07-05 MED ORDER — BISACODYL 10 MG RE SUPP: 10.0000 mg | Freq: Every day | RECTAL | Status: DC | PRN

## 2014-07-05 MED ORDER — ONDANSETRON HCL 4 MG PO TABS: 4.0000 mg | ORAL_TABLET | ORAL | Status: DC | PRN

## 2014-07-05 MED ORDER — BENZOCAINE-MENTHOL 20-0.5 % EX AERO
1.0000 "application " | INHALATION_SPRAY | CUTANEOUS | Status: DC | PRN
  Administered 2014-07-05: 1 via TOPICAL
  Filled 2014-07-05: qty 56

## 2014-07-05 MED ORDER — ONDANSETRON HCL 4 MG/2ML IJ SOLN: 4.0000 mg | Freq: Four times a day (QID) | INTRAMUSCULAR | Status: DC | PRN

## 2014-07-05 MED ORDER — LIDOCAINE HCL (PF) 1 % IJ SOLN
30.0000 mL | INTRAMUSCULAR | Status: AC | PRN
  Administered 2014-07-05 (×2): 5 mL via SUBCUTANEOUS
  Filled 2014-07-05: qty 30

## 2014-07-05 MED ORDER — METHYLERGONOVINE MALEATE 0.2 MG PO TABS: 0.2000 mg | ORAL_TABLET | ORAL | Status: DC | PRN

## 2014-07-05 MED ORDER — FLEET ENEMA 7-19 GM/118ML RE ENEM: 1.0000 | ENEMA | Freq: Every day | RECTAL | Status: DC | PRN

## 2014-07-05 MED ORDER — LACTATED RINGERS IV SOLN
500.0000 mL | INTRAVENOUS | Status: DC | PRN
Start: 2014-07-05 — End: 2014-07-05

## 2014-07-05 MED ORDER — LANOLIN HYDROUS EX OINT: TOPICAL_OINTMENT | CUTANEOUS | Status: DC | PRN

## 2014-07-05 MED ORDER — LACTATED RINGERS IV SOLN: INTRAVENOUS | Status: DC

## 2014-07-05 MED ORDER — LACTATED RINGERS IV SOLN
500.0000 mL | Freq: Once | INTRAVENOUS | Status: AC
  Administered 2014-07-05: 500 mL via INTRAVENOUS

## 2014-07-05 MED ORDER — TETANUS-DIPHTH-ACELL PERTUSSIS 5-2.5-18.5 LF-MCG/0.5 IM SUSP: 0.5000 mL | Freq: Once | INTRAMUSCULAR | Status: DC

## 2014-07-05 MED ORDER — DIPHENHYDRAMINE HCL 50 MG/ML IJ SOLN: 12.5000 mg | INTRAMUSCULAR | Status: DC | PRN

## 2014-07-05 MED ORDER — PRENATAL MULTIVITAMIN CH
1.0000 | ORAL_TABLET | Freq: Every day | ORAL | Status: DC
  Administered 2014-07-06: 1 via ORAL
  Filled 2014-07-05 (×2): qty 1

## 2014-07-05 MED ORDER — BUTORPHANOL TARTRATE 1 MG/ML IJ SOLN: 1.0000 mg | Freq: Once | INTRAMUSCULAR | Status: DC

## 2014-07-05 MED ORDER — ONDANSETRON HCL 4 MG/2ML IJ SOLN: 4.0000 mg | INTRAMUSCULAR | Status: DC | PRN

## 2014-07-05 MED ORDER — ACETAMINOPHEN 325 MG PO TABS: 650.0000 mg | ORAL_TABLET | ORAL | Status: DC | PRN

## 2014-07-05 MED ORDER — OXYTOCIN 40 UNITS IN LACTATED RINGERS INFUSION - SIMPLE MED: 62.5000 mL/h | INTRAVENOUS | Status: DC | PRN

## 2014-07-05 MED ORDER — WITCH HAZEL-GLYCERIN EX PADS: 1.0000 "application " | MEDICATED_PAD | CUTANEOUS | Status: DC | PRN

## 2014-07-05 MED ORDER — DIBUCAINE 1 % RE OINT: 1.0000 "application " | TOPICAL_OINTMENT | RECTAL | Status: DC | PRN

## 2014-07-05 MED ORDER — FERROUS SULFATE 325 (65 FE) MG PO TABS
325.0000 mg | ORAL_TABLET | Freq: Two times a day (BID) | ORAL | Status: DC
  Administered 2014-07-05 – 2014-07-06 (×3): 325 mg via ORAL
  Filled 2014-07-05 (×3): qty 1

## 2014-07-05 MED ORDER — CITRIC ACID-SODIUM CITRATE 334-500 MG/5ML PO SOLN: 30.0000 mL | ORAL | Status: DC | PRN

## 2014-07-05 MED ORDER — DIPHENHYDRAMINE HCL 25 MG PO CAPS: 25.0000 mg | ORAL_CAPSULE | Freq: Four times a day (QID) | ORAL | Status: DC | PRN

## 2014-07-05 NOTE — Anesthesia Postprocedure Evaluation (Signed)
  Anesthesia Post-op Note  Patient: Lacey Hartman  Procedure(s) Performed: * No procedures listed *  Patient Location: PACU and Mother/Baby  Anesthesia Type:Epidural  Level of Consciousness: awake, alert , oriented and patient cooperative  Airway and Oxygen Therapy: Patient Spontanous Breathing  Post-op Pain: mild  Post-op Assessment: Post-op Vital signs reviewed, Patient's Cardiovascular Status Stable, Respiratory Function Stable, Patent Airway, No headache, No backache, No residual numbness and No residual motor weakness  Post-op Vital Signs: Reviewed and stable  Last Vitals:  Filed Vitals:   07/05/14 1143  BP: 138/82  Pulse: 73  Temp: 36.9 C  Resp: 19    Complications: No apparent anesthesia complications

## 2014-07-05 NOTE — H&P (Signed)
Lacey Hartman is a 18 y.o. female [phone] with IUP at [redacted]w[redacted]d presenting for contractions.. Pt states she has been having regular, every 3 minutes contractions, associated with none vaginal bleeding.  Membranes are intact, with active fetal movement.  Was seen earlier today for r/o ROM and sent home intact.   [facility] at [facility] since 8 wks  Prenatal History/Complications:  Hx 36 week PTB, declined 17p  Past Medical History: Past Medical History  Diagnosis Date  . Hard of hearing   . Asthma     inhaler prn    Past Surgical History: Past Surgical History  Procedure Laterality Date  . No past surgeries      Obstetrical History: OB History   Grav Para Term Preterm Abortions TAB SAB Ect Mult Living   3 1 0 1 1 0 1 0 0 1       Social History: History   Social History  . Marital Status: Single    Spouse Name: N/A    Number of Children: N/A  . Years of Education: N/A   Social History Main Topics  . Smoking status: Current Every Day Smoker -- 0.50 packs/day for 5 years    Types: Cigarettes  . Smokeless tobacco: Never Used  . Alcohol Use: No  . Drug Use: No  . Sexual Activity: Yes    Birth Control/ Protection: None   Other Topics Concern  . Not on file   Social History Narrative  . No narrative on file    Family History: Family History  Problem Relation Age of Onset  . Cancer Maternal Grandmother   . Cancer Maternal Uncle     Allergies: Allergies  Allergen Reactions  . Codeine Hives    & hallucinations    Prescriptions prior to admission  Medication Sig Dispense Refill  . GuaiFENesin (COUGH SYRUP PO) Take 2 mLs by mouth as needed.      . Pediatric Multiple Vit-C-FA (FLINSTONES GUMMIES OMEGA-3 DHA) CHEW Chew by mouth. Take 2 daily      . Throat Lozenges (COUGH DROPS MT) Use as directed 1 tablet in the mouth or throat as needed.         Prenatal Transfer Tool  Maternal Diabetes: No Genetic Screening: Normal Maternal Ultrasounds/Referrals:  Normal Fetal Ultrasounds or other Referrals:  None Maternal Substance Abuse:  No Significant Maternal Medications:  None Significant Maternal Lab Results: None     Review of Systems   Constitutional: Negative for fever, chills, weight loss, malaise/fatigue and diaphoresis.  HENT: Negative for hearing loss, ear pain, nosebleeds, congestion, sore throat, neck pain, tinnitus and ear discharge.   Eyes: Negative for blurred vision, double vision, photophobia, pain, discharge and redness.  Respiratory: Negative for cough, hemoptysis, sputum production, shortness of breath, wheezing and stridor.   Cardiovascular: Negative for chest pain, palpitations, orthopnea,  leg swelling  Gastrointestinal: Positive for abdominal pain. Negative for heartburn, nausea, vomiting, diarrhea, constipation, blood in stool Genitourinary: Negative for dysuria, urgency, frequency, hematuria and flank pain.  Musculoskeletal: Negative for myalgias, back pain, joint pain and falls.  Skin: Negative for itching and rash.  Neurological: Negative for dizziness, tingling, tremors, sensory change, speech change, focal weakness, seizures, loss of consciousness, weakness and headaches.  Endo/Heme/Allergies: Negative for environmental allergies and polydipsia. Does not bruise/bleed easily.  Psychiatric/Behavioral: Negative for depression, suicidal ideas, hallucinations, memory loss and substance abuse. The patient is not nervous/anxious and does not have insomnia.       Last menstrual period 10/03/2013, not currently breastfeeding.  General appearance: alert, cooperative and mild distress Lungs: clear to auscultation bilaterally Heart: regular rate and rhythm Abdomen: soft, non-tender; bowel sounds normal Pelvic: 6-7/100/-1 Extremities: Homans sign is negative, no sign of DVT DTR's 2+ Presentation: cephalic Fetal monitoringBaseline: 120 bpm, Variability: Good {> 6 bpm), Accelerations: Reactive and Decelerations:  Absent Uterine activity  q 2-3 minutes      Prenatal labs: ABO, Rh: A/POS/-- (02/16 1126) Antibody: NEG (07/09 0904) Rubella:  immune RPR: NON REAC (07/09 0904)  HBsAg: NEGATIVE (02/16 1126)  HIV: NONREACTIVE (07/09 0904)  GBS: NOT DETECTED (09/03 1437)  2 hr Glucola 70/105/88 Genetic screening  normal Anatomy US normal   No results found for this or any previous visit (from the past 24 hour(s)).  Assessment: Lacey Hartman is a 18 y.o. [phone] with an IUP at [redacted]w[redacted]d presenting for active labor  Plan: #Labor: active #Pain:  epidural #FWBcat 1 #ID: GBS: negative  #MOF:  bottle #MOC: IUD #Circ: girl   CRESENZO-DISHMAN,Nichole Neyer 07/05/2014, 1:13 AM

## 2014-07-05 NOTE — Anesthesia Procedure Notes (Signed)
Epidural Patient location during procedure: OB  Staffing Anesthesiologist: Torianna Junio, CHRIS Performed by: anesthesiologist   Preanesthetic Checklist Completed: patient identified, surgical consent, pre-op evaluation, timeout performed, IV checked, risks and benefits discussed and monitors and equipment checked  Epidural Patient position: sitting Prep: site prepped and draped and DuraPrep Patient monitoring: heart rate, cardiac monitor, continuous pulse ox and blood pressure Approach: midline Location: L4-L5 Injection technique: LOR saline  Needle:  Needle type: Tuohy  Needle gauge: 17 G Needle length: 9 cm Needle insertion depth: 5 cm Catheter type: closed end flexible Catheter size: 19 Gauge Catheter at skin depth: 12 cm Test dose: Other  Assessment Events: blood not aspirated, injection not painful, no injection resistance, negative IV test and no paresthesia  Additional Notes H+P and labs checked, risks and benefits discussed with the patient, consent obtained, procedure tolerated well and without complications.  Reason for block:procedure for pain

## 2014-07-05 NOTE — H&P (Signed)
Attestation of Attending Supervision of Advanced Practitioner (CNM/NP): Evaluation and management procedures were performed by the Advanced Practitioner under my supervision and collaboration.  I have reviewed the Advanced Practitioner's note and chart, and I agree with the management and plan.  HARRAWAY-SMITH, Afia Messenger 9:08 AM

## 2014-07-05 NOTE — Anesthesia Preprocedure Evaluation (Signed)
Anesthesia Evaluation  Patient identified by MRN, date of birth, ID band Patient awake    Reviewed: Allergy & Precautions, H&P , NPO status , Patient's Chart, lab work & pertinent test results  History of Anesthesia Complications Negative for: history of anesthetic complications  Airway Mallampati: II TM Distance: >3 FB Neck ROM: Full    Dental  (+) Teeth Intact   Pulmonary asthma , Current Smoker,          Cardiovascular negative cardio ROS  Rhythm:Regular     Neuro/Psych negative neurological ROS  negative psych ROS   GI/Hepatic negative GI ROS, Neg liver ROS,   Endo/Other  negative endocrine ROS  Renal/GU negative Renal ROS     Musculoskeletal   Abdominal   Peds  Hematology  (+) anemia ,   Anesthesia Other Findings   Reproductive/Obstetrics (+) Pregnancy                           Anesthesia Physical Anesthesia Plan  ASA: II  Anesthesia Plan: Epidural   Post-op Pain Management:    Induction:   Airway Management Planned:   Additional Equipment:   Intra-op Plan:   Post-operative Plan:   Informed Consent: I have reviewed the patients History and Physical, chart, labs and discussed the procedure including the risks, benefits and alternatives for the proposed anesthesia with the patient or authorized representative who has indicated his/her understanding and acceptance.     Plan Discussed with: Anesthesiologist  Anesthesia Plan Comments:         Anesthesia Quick Evaluation

## 2014-07-05 NOTE — MAU Note (Signed)
Contractions every 5 minutes tonight with bloody show. Denies LOF. Positive fetal movement.

## 2014-07-05 NOTE — Progress Notes (Signed)
UR chart review completed.  

## 2014-07-06 MED ORDER — IBUPROFEN 600 MG PO TABS: 600.0000 mg | ORAL_TABLET | Freq: Four times a day (QID) | ORAL | Status: DC | PRN

## 2014-07-06 MED ORDER — DEXTROMETHORPHAN POLISTIREX 30 MG/5ML PO LQCR
30.0000 mg | Freq: Two times a day (BID) | ORAL | Status: DC
  Administered 2014-07-06: 30 mg via ORAL
  Filled 2014-07-06 (×4): qty 5

## 2014-07-06 NOTE — Discharge Instructions (Signed)

## 2014-07-06 NOTE — Discharge Summary (Signed)
Obstetric Discharge Summary Reason for Admission: onset of labor Prenatal Procedures: none Intrapartum Procedures: spontaneous vaginal delivery Postpartum Procedures: none Complications-Operative and Postpartum: none Hemoglobin  Date Value Ref Range Status  07/05/2014 11.7* 12.0 - 15.0 g/dL Final     HCT  Date Value Ref Range Status  07/05/2014 34.7* 36.0 - 46.0 % Final    Discharge Diagnoses: Term Pregnancy-delivered  Hospital Course:  Lacey Hartman is a 18 y.o. [id number] who presented with SOL.  She had a uncomplicated SVD. She was able to ambulate, tolerate PO and void normally. She was discharged home with instructions for postpartum care.    Delivery Note Pt progressed nicely through labor. She had SROM and quickly developed and urge to push. After a brief 2nd stage, at 4:42 AM a viable female was delivered via (Presentation:ROA ). THere was a loose nuchal cord, delivered throught. APGAR: 9/9; weight pending .  Placenta status: Placenta fell out at 0443, intact with 3 vessel cord:  Anesthesia: epidural  Episiotomy: none  Lacerations: none  Suture Repair: n/a  Est. Blood Loss (mL): 100  Mom to postpartum. Baby to Couplet care / Skin to Skin.  Physical Exam:  General: alert, cooperative and no distress Lochia: appropriate Uterine Fundus: firm DVT Evaluation: No evidence of DVT seen on physical exam. Negative Homan's sign. No cords or calf tenderness. No significant calf/ankle edema.  Discharge Information: Date: 07/06/2014 Activity: pelvic rest Diet: routine Medications: PNV and Ibuprofen Baby feeding: plans to bottle feed Contraception: IUD Condition: stable Instructions: refer to practice specific booklet Discharge to: home   Newborn Data: Live born female  Birth Weight: 7 lb 10 oz (3459 g) APGAR: 9, 9  Home with mother.  Kathrine Cords, MD [facility] PGY-1 07/06/2014, 7:23 AM  Evaluation and management procedures were performed by Resident physician under my  supervision/collaboration. Chart reviewed, patient examined by me and I agree with management and plan. Lorene Dy, CNM 07/06/2014 8:42 AM

## 2014-07-09 ENCOUNTER — Ambulatory Visit (INDEPENDENT_AMBULATORY_CARE_PROVIDER_SITE_OTHER): Payer: Medicaid Other | Admitting: Family Medicine

## 2014-07-09 ENCOUNTER — Other Ambulatory Visit: Payer: Self-pay | Admitting: *Deleted

## 2014-07-09 ENCOUNTER — Encounter: Payer: Medicaid Other | Admitting: Women's Health

## 2014-07-09 VITALS — Temp 97.3°F

## 2014-07-09 DIAGNOSIS — J019 Acute sinusitis, unspecified: Secondary | ICD-10-CM

## 2014-07-09 MED ORDER — AZITHROMYCIN 250 MG PO TABS: ORAL_TABLET | ORAL | Status: DC

## 2014-07-09 NOTE — Progress Notes (Signed)
   Subjective:    Patient ID: Lacey Hartman, female    DOB: [birth year], 18 y.o.   MRN: [id number]  HPI She has had illness for about 8 days it's been progressively worse with sinus pain started off with runny nose and cough then got progressively worse denied any other particular troubles   Review of Systems Relates fever intermittently congestion cough denies wheezing or shortness of breath    Objective:   Physical Exam Her lungs are clear heart is regular sinuses nontender eardrums normal.  Coarse cough noted with mucoid drainage     Assessment & Plan:  Upper rest revealed a swollen with sinusitis antibiotic as prescribed warning signs discussed followup if ongoing troubles.

## 2014-08-12 ENCOUNTER — Ambulatory Visit: Payer: Medicaid Other | Admitting: Women's Health

## 2014-08-14 ENCOUNTER — Ambulatory Visit (INDEPENDENT_AMBULATORY_CARE_PROVIDER_SITE_OTHER): Payer: Medicaid Other | Admitting: Women's Health

## 2014-08-14 ENCOUNTER — Encounter: Payer: Self-pay | Admitting: Women's Health

## 2014-08-14 DIAGNOSIS — F17200 Nicotine dependence, unspecified, uncomplicated: Secondary | ICD-10-CM

## 2014-08-14 DIAGNOSIS — B9689 Other specified bacterial agents as the cause of diseases classified elsewhere: Secondary | ICD-10-CM

## 2014-08-14 DIAGNOSIS — Z720 Tobacco use: Secondary | ICD-10-CM

## 2014-08-14 DIAGNOSIS — N760 Acute vaginitis: Secondary | ICD-10-CM

## 2014-08-14 DIAGNOSIS — N898 Other specified noninflammatory disorders of vagina: Secondary | ICD-10-CM

## 2014-08-14 DIAGNOSIS — Z3202 Encounter for pregnancy test, result negative: Secondary | ICD-10-CM

## 2014-08-14 DIAGNOSIS — A499 Bacterial infection, unspecified: Secondary | ICD-10-CM

## 2014-08-14 LAB — POCT WET PREP (WET MOUNT): CLUE CELLS WET PREP WHIFF POC: POSITIVE

## 2014-08-14 LAB — POCT URINE PREGNANCY: Preg Test, Ur: NEGATIVE

## 2014-08-14 MED ORDER — METRONIDAZOLE 500 MG PO TABS: 500.0000 mg | ORAL_TABLET | Freq: Two times a day (BID) | ORAL | Status: DC

## 2014-08-14 MED ORDER — NORETHIN-ETH ESTRAD-FE BIPHAS 1 MG-10 MCG / 10 MCG PO TABS
1.0000 | ORAL_TABLET | Freq: Every day | ORAL | Status: DC
Start: 2014-08-14 — End: 2015-07-17

## 2014-08-14 NOTE — Progress Notes (Addendum)
Patient ID: Lacey Hartman, female   DOB: Dec 22, 1995, 18 y.o.   MRN: [id number] Subjective:    Lacey Hartman is a 18 y.o. [phone] Caucasian female who presents for a postpartum visit. She is 5 weeks postpartum following a spontaneous vaginal delivery at 39.2 gestational weeks. Anesthesia: epidural. I have fully reviewed the prenatal and intrapartum course. Postpartum course has been complicated by vaginal odor, d/c, itching & burning w/ voiding since birth of baby. Baby's course has been uncomplicated. Baby is feeding by bottle. Bleeding no bleeding. Bowel function is normal. Bladder function is normal. Patient is sexually active. Last sexual activity: 3d ago, hasn't always used condoms- requests UPT. Contraception method is occ condoms/withdrawal and wants COCs. No h/o HTN, DVT/PE, CVA, MI, or migraines w/ aura. Smokes 1pack q 5 days. Postpartum depression screening: negative. Score 8.  Last pap <21yo.  The following portions of the patient's history were reviewed and updated as appropriate: allergies, current medications, past medical history, past surgical history and problem list.  Review of Systems Pertinent items are noted in HPI.   Filed Vitals:   08/14/14 1454  BP: 128/80  Weight: 138 lb (62.596 kg)   No LMP recorded.  Objective:   General:  alert, cooperative and no distress   Breasts:  deferred, no complaints  Lungs: clear to auscultation bilaterally  Heart:  regular rate and rhythm  Abdomen: soft, nontender   Vulva: normal  Vagina: normal vagina, small amount thin white malodorous d/c  Cervix:  closed  Corpus: Well-involuted  Adnexa:  Non-palpable  Rectal Exam: No hemorrhoids        Results for orders placed in visit on 08/14/14 (from the past 24 hour(s))  POCT WET PREP (WET MOUNT)     Status: Abnormal   Collection Time    08/14/14  3:28 PM      Result Value Ref Range   Source Wet Prep POC vaginal     WBC, Wet Prep HPF POC none     Bacteria Wet Prep HPF POC none     BACTERIA WET PREP MORPHOLOGY POC       Clue Cells Wet Prep HPF POC Many     Clue Cells Wet Prep Whiff POC Positive Whiff     Yeast Wet Prep HPF POC None     KOH Wet Prep POC       Trichomonas Wet Prep HPF POC none    POCT URINE PREGNANCY     Status: None   Collection Time    08/14/14  3:35 PM      Result Value Ref Range   Preg Test, Ur Negative      UPT today: NEG Assessment:   Postpartum exam 5 wks s/p SVB Bottlefeeding Depression screening Contraception counseling  BV Smoker  Plan:  Rx flagyl for BV, no etoh Advised smoking cessation, discussed risks to herself and the baby Contraception: rx Lo Loestrin w/ 11RF Follow up in: 3 months for COC f/u or earlier if needed  Tawnya Crook CNM, [facility] 08/14/2014 3:40 PM

## 2014-08-19 ENCOUNTER — Encounter: Payer: Self-pay | Admitting: Women's Health

## 2014-11-14 ENCOUNTER — Ambulatory Visit: Payer: Medicaid Other | Admitting: Advanced Practice Midwife

## 2014-11-14 ENCOUNTER — Encounter: Payer: Self-pay | Admitting: Advanced Practice Midwife

## 2014-11-21 ENCOUNTER — Ambulatory Visit (INDEPENDENT_AMBULATORY_CARE_PROVIDER_SITE_OTHER): Payer: Medicaid Other | Admitting: Advanced Practice Midwife

## 2014-11-21 ENCOUNTER — Encounter: Payer: Self-pay | Admitting: Advanced Practice Midwife

## 2014-11-21 VITALS — Ht 61.0 in | Wt 130.0 lb

## 2014-11-21 DIAGNOSIS — Z30018 Encounter for initial prescription of other contraceptives: Secondary | ICD-10-CM

## 2014-11-21 DIAGNOSIS — Z3202 Encounter for pregnancy test, result negative: Secondary | ICD-10-CM

## 2014-11-21 LAB — POCT URINE PREGNANCY: PREG TEST UR: NEGATIVE

## 2014-11-21 MED ORDER — ETONOGESTREL-ETHINYL ESTRADIOL 0.12-0.015 MG/24HR VA RING: VAGINAL_RING | VAGINAL | Status: DC

## 2014-11-21 NOTE — Progress Notes (Signed)
[facility] Visit  Patient name: Lacey Hartman MRN [id number]  Date of birth: [birth year]  CC & HPI:  Lacey Hartman is a 19 y.o. Caucasian female presenting today for contraception managemnet.  Currently, she is on COC's, but forgets to take them regularly.   Pertinent History Reviewed:  Medical & Surgical Hx:   Past Medical History  Diagnosis Date  . Hard of hearing   . Asthma     inhaler prn   Past Surgical History  Procedure Laterality Date  . No past surgeries     Family History  Problem Relation Age of Onset  . Cancer Maternal Grandmother   . Cancer Maternal Uncle      Current outpatient prescriptions:  .  Norethindrone-Ethinyl Estradiol-Fe Biphas (LO LOESTRIN FE) 1 MG-10 MCG / 10 MCG tablet, Take 1 tablet by mouth daily., Disp: 1 Package, Rfl: 11 .  PRESCRIPTION MEDICATION, Pt states takes medication for depression unsure of name, Disp: , Rfl:  .  azithromycin (ZITHROMAX Z-PAK) 250 MG tablet, Take 2 tablets (500 mg) on  Day 1,  followed by 1 tablet (250 mg) once daily on Days 2 through 5. (Patient not taking: Reported on 11/21/2014), Disp: 6 each, Rfl: 0 .  ibuprofen (ADVIL,MOTRIN) 600 MG tablet, Take 1 tablet (600 mg total) by mouth every 6 (six) hours as needed for cramping. (Patient not taking: Reported on 11/21/2014), Disp: 30 tablet, Rfl: 0 .  metroNIDAZOLE (FLAGYL) 500 MG tablet, Take 1 tablet (500 mg total) by mouth 2 (two) times daily. X 7 days (Patient not taking: Reported on 11/21/2014), Disp: 14 tablet, Rfl: 0 .  Pediatric Multiple Vit-C-FA (FLINSTONES GUMMIES OMEGA-3 DHA) CHEW, Chew by mouth. Take 2 daily, Disp: , Rfl:  Social History: Reviewed -  reports that she has quit smoking. Her smoking use included Cigarettes. She quit after 5 years of use. She has never used smokeless tobacco.  Objective Findings:  Vitals: Ht 5\' 1"  (1.549 m)  Wt 130 lb (58.968 kg)  BMI 24.58 kg/m2  LMP 11/04/2013  Physical Examination: General appearance - alert, well  appearing, and in no distress Mental status - alert, oriented to person, place, and time Chest - clear to auscultation, no wheezes, rales or rhonchi, symmetric air entry Heart - normal rate and regular rhythm Abdomen - soft, nontender, nondistended, no masses or organomegaly Musculoskeletal - no muscular tenderness noted Skin - normal coloration and turgor, no rashes, no suspicious skin lesions noted  Results for orders placed or performed in visit on 11/21/14 (from the past 24 hour(s))  POCT urine pregnancy   Collection Time: 11/21/14  2:38 PM  Result Value Ref Range   Preg Test, Ur Negative    IUD, Nexplanon, and Nuva ring discussed in detail.  Pt chooses Nuva Ring.    15 minutes spent face to face talking      Assessment & Plan:  A:   Contraception management P:  Nuva ring.  Insert today.  Condoms X 1 month.  Plan to remove the 25th of every month and insert a new one on the 1st of every month   F/U prn   CRESENZO-DISHMAN,Afifa Truax CNM 11/21/2014 2:40 PM

## 2014-11-21 NOTE — Patient Instructions (Addendum)

## 2014-12-02 ENCOUNTER — Emergency Department (HOSPITAL_COMMUNITY)
Admission: EM | Admit: 2014-12-02 | Discharge: 2014-12-02 | Disposition: A | Discharge status: Home / Self Care | Payer: No Typology Code available for payment source | Attending: Emergency Medicine | Admitting: Emergency Medicine

## 2014-12-02 ENCOUNTER — Encounter (HOSPITAL_COMMUNITY): Payer: Self-pay

## 2014-12-02 ENCOUNTER — Emergency Department (HOSPITAL_COMMUNITY): Payer: No Typology Code available for payment source

## 2014-12-02 DIAGNOSIS — S46812A Strain of other muscles, fascia and tendons at shoulder and upper arm level, left arm, initial encounter: Secondary | ICD-10-CM | POA: Insufficient documentation

## 2014-12-02 DIAGNOSIS — S161XXA Strain of muscle, fascia and tendon at neck level, initial encounter: Secondary | ICD-10-CM

## 2014-12-02 DIAGNOSIS — Z3202 Encounter for pregnancy test, result negative: Secondary | ICD-10-CM | POA: Insufficient documentation

## 2014-12-02 DIAGNOSIS — Z793 Long term (current) use of hormonal contraceptives: Secondary | ICD-10-CM | POA: Diagnosis not present

## 2014-12-02 DIAGNOSIS — H9190 Unspecified hearing loss, unspecified ear: Secondary | ICD-10-CM | POA: Diagnosis not present

## 2014-12-02 DIAGNOSIS — Y9389 Activity, other specified: Secondary | ICD-10-CM | POA: Insufficient documentation

## 2014-12-02 DIAGNOSIS — S199XXA Unspecified injury of neck, initial encounter: Secondary | ICD-10-CM | POA: Insufficient documentation

## 2014-12-02 DIAGNOSIS — Z79899 Other long term (current) drug therapy: Secondary | ICD-10-CM | POA: Insufficient documentation

## 2014-12-02 DIAGNOSIS — Z87891 Personal history of nicotine dependence: Secondary | ICD-10-CM | POA: Diagnosis not present

## 2014-12-02 DIAGNOSIS — Y998 Other external cause status: Secondary | ICD-10-CM | POA: Diagnosis not present

## 2014-12-02 DIAGNOSIS — J45909 Unspecified asthma, uncomplicated: Secondary | ICD-10-CM | POA: Insufficient documentation

## 2014-12-02 DIAGNOSIS — S39012A Strain of muscle, fascia and tendon of lower back, initial encounter: Secondary | ICD-10-CM | POA: Insufficient documentation

## 2014-12-02 DIAGNOSIS — S59911A Unspecified injury of right forearm, initial encounter: Secondary | ICD-10-CM | POA: Insufficient documentation

## 2014-12-02 DIAGNOSIS — S3992XA Unspecified injury of lower back, initial encounter: Secondary | ICD-10-CM | POA: Diagnosis present

## 2014-12-02 DIAGNOSIS — Y92410 Unspecified street and highway as the place of occurrence of the external cause: Secondary | ICD-10-CM | POA: Insufficient documentation

## 2014-12-02 DIAGNOSIS — S0990XA Unspecified injury of head, initial encounter: Secondary | ICD-10-CM | POA: Diagnosis not present

## 2014-12-02 LAB — POC URINE PREG, ED: Preg Test, Ur: NEGATIVE

## 2014-12-02 MED ORDER — IBUPROFEN 400 MG PO TABS
600.0000 mg | ORAL_TABLET | Freq: Once | ORAL | Status: AC
  Administered 2014-12-02: 600 mg via ORAL
  Filled 2014-12-02: qty 2
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
## 2015-03-06 ENCOUNTER — Encounter: Payer: Self-pay | Admitting: Nurse Practitioner

## 2015-03-06 ENCOUNTER — Ambulatory Visit (INDEPENDENT_AMBULATORY_CARE_PROVIDER_SITE_OTHER): Payer: Medicaid Other | Admitting: Nurse Practitioner

## 2015-03-06 VITALS — BP 118/68 | Temp 98.2°F | Ht 61.0 in | Wt 130.6 lb

## 2015-03-06 DIAGNOSIS — K088 Other specified disorders of teeth and supporting structures: Secondary | ICD-10-CM

## 2015-03-06 DIAGNOSIS — A499 Bacterial infection, unspecified: Secondary | ICD-10-CM | POA: Diagnosis not present

## 2015-03-06 DIAGNOSIS — B9689 Other specified bacterial agents as the cause of diseases classified elsewhere: Secondary | ICD-10-CM

## 2015-03-06 DIAGNOSIS — B349 Viral infection, unspecified: Secondary | ICD-10-CM | POA: Diagnosis not present

## 2015-03-06 DIAGNOSIS — R300 Dysuria: Secondary | ICD-10-CM | POA: Diagnosis not present

## 2015-03-06 DIAGNOSIS — K0889 Other specified disorders of teeth and supporting structures: Secondary | ICD-10-CM

## 2015-03-06 DIAGNOSIS — N760 Acute vaginitis: Secondary | ICD-10-CM | POA: Diagnosis not present

## 2015-03-06 LAB — POCT URINALYSIS DIPSTICK
Blood, UA: NEGATIVE
Leukocytes, UA: NEGATIVE
SPEC GRAV UA: 1.015
pH, UA: 5

## 2015-03-06 MED ORDER — METRONIDAZOLE 0.75 % VA GEL: 1.0000 | Freq: Every day | VAGINAL | Status: DC

## 2015-03-06 MED ORDER — PENICILLIN V POTASSIUM 500 MG PO TABS: 500.0000 mg | ORAL_TABLET | Freq: Three times a day (TID) | ORAL | Status: DC

## 2015-03-06 NOTE — Patient Instructions (Signed)
Tick Bite Information Ticks are insects that attach themselves to the skin. There are many types of ticks. Common types include wood ticks and deer ticks. Sometimes, ticks carry diseases that can make a person very ill. The most common places for ticks to attach themselves are the scalp, neck, armpits, waist, and groin.  HOW CAN YOU PREVENT TICK BITES? Take these steps to help prevent tick bites when you are outdoors:  Wear long sleeves and long pants.  Wear white clothes so you can see ticks more easily.  Tuck your pant legs into your socks.  If walking on a trail, stay in the middle of the trail to avoid brushing against bushes.  Avoid walking through areas with long grass.  Put bug spray on all skin that is showing and along boot tops, pant legs, and sleeve cuffs.  Check clothes, hair, and skin often and before going inside.  Brush off any ticks that are not attached.  Take a shower or bath as soon as possible after being outdoors. HOW SHOULD YOU REMOVE A TICK? Ticks should be removed as soon as possible to help prevent diseases. 1. If latex gloves are available, put them on before trying to remove a tick. 2. Use tweezers to grasp the tick as close to the skin as possible. You may also use curved forceps or a tick removal tool. Grasp the tick as close to its head as possible. Avoid grasping the tick on its body. 3. Pull gently upward until the tick lets go. Do not twist the tick or jerk it suddenly. This may break off the tick's head or mouth parts. 4. Do not squeeze or crush the tick's body. This could force disease-carrying fluids from the tick into your body. 5. After the tick is removed, wash the bite area and your hands with soap and water or alcohol. 6. Apply a small amount of antiseptic cream or ointment to the bite site. 7. Wash any tools that were used. Do not try to remove a tick by applying a hot match, petroleum jelly, or fingernail polish to the tick. These methods do  not work. They may also increase the chances of disease being spread from the tick bite. WHEN SHOULD YOU SEEK HELP? Contact your health care provider if you are unable to remove a tick or if a part of the tick breaks off in the skin. After a tick bite, you need to watch for signs and symptoms of diseases that can be spread by ticks. Contact your health care provider if you develop any of the following:  Fever.  Rash.  Redness and puffiness (swelling) in the area of the tick bite.  Tender, puffy lymph glands.  Watery poop (diarrhea).  Weight loss.  Cough.  Feeling more tired than normal (fatigue).  Muscle, joint, or bone pain.  Belly (abdominal) pain.  Headache.  Change in your level of consciousness.  Trouble walking or moving your legs.  Loss of feeling (numbness) in the legs.  Loss of movement (paralysis).  Shortness of breath.  Confusion.  Throwing up (vomiting) many times. Document Released: 12/29/2009 Document Revised: 06/06/2013 Document Reviewed: 03/14/2013 Exit[employer] Patient Information 2015 [employer]. This information is not intended to replace advice given to you by your health care provider. Make sure you discuss any questions you have with your health care provider.  

## 2015-03-10 ENCOUNTER — Encounter: Payer: Self-pay | Admitting: Nurse Practitioner

## 2015-03-10 LAB — POCT WET PREP WITH KOH
BACTERIA WET PREP HPF POC: POSITIVE
Clue Cells Wet Prep HPF POC: POSITIVE
EPITHELIAL WET PREP PER HPF POC: POSITIVE
KOH PREP POC: NEGATIVE
RBC WET PREP PER HPF POC: NEGATIVE
TRICHOMONAS UA: NEGATIVE
Yeast Wet Prep HPF POC: NEGATIVE
pH, Wet Prep: 5.5

## 2015-03-10 NOTE — Progress Notes (Signed)
Subjective:  Presents for several complaints. Complaints of fever headache runny nose and ear pain that began yesterday. Also several episodes of diarrhea which has improved. No nausea vomiting. No abdominal pain. Slight dysuria for the past 2 days. No urgency frequency or incontinence. No pelvic pain. Slight white discharge, no itching or burning. Same sexual partner. No CVA or flank pain. Also tick bite to her right posterior leg for several days. Mildly pruritic at first. No other rash. Complaints of pain in the upper left teeth, has not had dental care in a while.  Objective:   BP 118/68 mmHg  Temp(Src) 98.2 F (36.8 C) (Oral)  Ht 5\' 1"  (1.549 m)  Wt 130 lb 9.6 oz (59.24 kg)  BMI 24.69 kg/m2  LMP 02/05/2015 NAD. Alert, oriented. TMs clear effusion, no erythema. Pharynx clear. A tiny hole is noted on one of her back teeth upper left side. Tender to palpation along the buccal mucosa but no areas of edema noted. Neck supple with mild soft anterior adenopathy. Lungs clear. Heart regular rate rhythm. Abdomen soft nontender. Small slightly raised pink area with some excoriation noted on the right posterior leg. Nontender. No other rash noted. Results for orders placed or performed in visit on 03/06/15  POCT urinalysis dipstick  Result Value Ref Range   Color, UA     Clarity, UA     Glucose, UA     Bilirubin, UA     Ketones, UA     Spec Grav, UA 1.015    Blood, UA neg    pH, UA 5.0    Protein, UA     Urobilinogen, UA     Nitrite, UA     Leukocytes, UA Negative   POCT Wet Prep with KOH  Result Value Ref Range   Trichomonas, UA Negative    Clue Cells Wet Prep HPF POC pos    Epithelial Wet Prep HPF POC pos    Yeast Wet Prep HPF POC neg    Bacteria Wet Prep HPF POC pos    RBC Wet Prep HPF POC neg    WBC Wet Prep HPF POC occas    KOH Prep POC Negative    pH, Wet Prep 5.5       Assessment: Viral illness  Bacterial vaginosis  Tooth pain  Dysuria - Plan: POCT urinalysis  dipstick   Plan:  Meds ordered this encounter  Medications  . penicillin v potassium (VEETID) 500 MG tablet    Sig: Take 1 tablet (500 mg total) by mouth 3 (three) times daily.    Dispense:  21 tablet    Refill:  0    Order Specific Question:  Supervising Provider    Answer:  Mikey Kirschner [[id number]]  . metroNIDAZOLE (METROGEL VAGINAL) 0.75 % vaginal gel    Sig: Place 1 Applicatorful vaginally at bedtime. X 5 d    Dispense:  70 g    Refill:  0    Order Specific Question:  Supervising Provider    Answer:  Mikey Kirschner [[id number]]   Discussed symptomatic care and warning signs. Call back next week if no improvement, sooner if worse. Also recommend dental care.

## 2015-04-28 ENCOUNTER — Ambulatory Visit: Payer: Medicaid Other | Admitting: Adult Health

## 2015-06-05 ENCOUNTER — Telehealth: Payer: Self-pay | Admitting: Family Medicine

## 2015-06-05 MED ORDER — AMOXICILLIN 500 MG PO TABS: 500.0000 mg | ORAL_TABLET | Freq: Three times a day (TID) | ORAL | Status: DC

## 2015-06-05 NOTE — Telephone Encounter (Signed)
Patient with some sore throat no fever [redacted] wk pregnant TMS nl Throat nl small lymphadenopathy right side, will tx with amoxil 500 tid 10 days, if ongoing sx then NTBS pt aware

## 2015-07-15 ENCOUNTER — Telehealth: Payer: Self-pay | Admitting: Family Medicine

## 2015-07-15 NOTE — Telephone Encounter (Signed)
error 

## 2015-07-17 ENCOUNTER — Ambulatory Visit (INDEPENDENT_AMBULATORY_CARE_PROVIDER_SITE_OTHER): Payer: Medicaid Other | Admitting: Family Medicine

## 2015-07-17 ENCOUNTER — Encounter: Payer: Self-pay | Admitting: Family Medicine

## 2015-07-17 VITALS — BP 98/64 | Ht 61.0 in | Wt 134.0 lb

## 2015-07-17 DIAGNOSIS — F988 Other specified behavioral and emotional disorders with onset usually occurring in childhood and adolescence: Secondary | ICD-10-CM

## 2015-07-17 DIAGNOSIS — M5440 Lumbago with sciatica, unspecified side: Secondary | ICD-10-CM

## 2015-07-17 DIAGNOSIS — H9190 Unspecified hearing loss, unspecified ear: Secondary | ICD-10-CM | POA: Diagnosis not present

## 2015-07-17 DIAGNOSIS — H538 Other visual disturbances: Secondary | ICD-10-CM | POA: Diagnosis not present

## 2015-07-17 DIAGNOSIS — R51 Headache: Secondary | ICD-10-CM | POA: Diagnosis not present

## 2015-07-17 DIAGNOSIS — R519 Headache, unspecified: Secondary | ICD-10-CM

## 2015-07-17 DIAGNOSIS — F909 Attention-deficit hyperactivity disorder, unspecified type: Secondary | ICD-10-CM | POA: Diagnosis not present

## 2015-07-17 NOTE — Progress Notes (Signed)
   Subjective:    Patient ID: Lacey Hartman, female    DOB: Mar 10, 1996, 19 y.o.   MRN: [id number]  HPIpt arrives today to discuss restarting adhd meds. Pt states she use to take. States she was more active when taking med and could focus better.  Very complex visit She relates severe hearing loss needs hearing aids would like referral to ENT she does use hearing aids in the past and that helped she denies any sinus pressure pain discomfort fever sweats chills She relates low back pain that radiates down the right leg intermittently over the past year she's tried some OTC measures without any success she wonders what may be going on. Patient relates having blurred vision headaches intermittent over the past year progressively get worse is tried Tylenol for it does not wake her up at night no vomiting with it no double vision with it. Has seen eye doctor who told her that he did not feel it was a visual problem in stated that her optic disks looked good Patient concerned about the possibility of ADD states had difficult time focusing had ADD as a young child and through high school. Has not been on any medicine recently is pregnant.  Review of Systems  Constitutional: Negative for activity change, appetite change and fatigue.  Gastrointestinal: Negative for abdominal pain.  Neurological: Negative for headaches.  Psychiatric/Behavioral: Negative for behavioral problems.       Objective:   Physical Exam  Constitutional: She appears well-developed and well-nourished.  HENT:  Head: Normocephalic.  Cardiovascular: Normal rate, regular rhythm and normal heart sounds.   No murmur heard. Pulmonary/Chest: Effort normal and breath sounds normal.  Neurological: She is alert.  Skin: Skin is warm and dry.  Psychiatric: She has a normal mood and affect.  Vitals reviewed.    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 She also has some hearing difficulties. 25 minutes spent with patient  discussing multiple different issues. Treatment options were discussed in detail. Plus also questions were answered. Through the 25 minute visit multiple various topics were entertained     Assessment & Plan:  Hearing deficit referral to ENT for further evaluation probable ear/audiology evaluation for hearing aids Back strain she was shown exercises she could do to help this may use Tylenol no other medicines because of pregnancy Patient is pregnant currently I do not recommend for her to be on any ADD medicines Patient probably does have moderate ADD but I feel that she is compensating for this well we talked about techniques that can be done to try to help this Patient with headaches and blurred vision hard to discern what is causing this this is been going on for a year I would recommend referral to neurology. I do not feel comfortable ordering MRI I don't think that would affect her pregnancy but I believe she would benefit from neurology consultation Frequent back pain probable musculoskeletal intermittent sciatica. I doubt any type severe herniated disc don't feel she needs any type of surgery.

## 2015-07-24 ENCOUNTER — Ambulatory Visit (INDEPENDENT_AMBULATORY_CARE_PROVIDER_SITE_OTHER): Payer: Medicaid Other | Admitting: Otolaryngology

## 2015-07-24 DIAGNOSIS — H903 Sensorineural hearing loss, bilateral: Secondary | ICD-10-CM

## 2015-08-19 ENCOUNTER — Encounter: Payer: Self-pay | Admitting: Family Medicine

## 2015-12-24 ENCOUNTER — Ambulatory Visit (INDEPENDENT_AMBULATORY_CARE_PROVIDER_SITE_OTHER): Payer: Medicaid Other | Admitting: Family Medicine

## 2015-12-24 ENCOUNTER — Other Ambulatory Visit: Payer: Self-pay | Admitting: *Deleted

## 2015-12-24 ENCOUNTER — Encounter: Payer: Self-pay | Admitting: Family Medicine

## 2015-12-24 VITALS — BP 110/72 | Ht 61.0 in | Wt 154.0 lb

## 2015-12-24 DIAGNOSIS — F988 Other specified behavioral and emotional disorders with onset usually occurring in childhood and adolescence: Secondary | ICD-10-CM | POA: Insufficient documentation

## 2015-12-24 DIAGNOSIS — F909 Attention-deficit hyperactivity disorder, unspecified type: Secondary | ICD-10-CM | POA: Diagnosis not present

## 2015-12-24 NOTE — Progress Notes (Signed)
   Subjective:    Patient ID: Lacey Hartman, female    DOB: [birth year], 20 y.o.   MRN: [id number]  HPIpt arrives today to discuss starting ADD med. Trouble focusing.   she has a long history of ADD through her school years. She is no longer in school. She is apparent. She states she has a hard time staying focused hard time staying organized. In addition to this she finds her mind wandering frequently. She wonders if she can get restarted on medication.   Review of Systems  denies being depressed suicidal denies headaches    Objective:   Physical Exam   not indicated      Assessment & Plan:   I am not comfortable with treating adult ADD. 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 She is been off the medicine the past few years. I believe that her treatment would be best guided by mental health professional if indicated. Referral recommended

## 2015-12-29 ENCOUNTER — Encounter: Payer: Self-pay | Admitting: Family Medicine

## 2016-01-02 ENCOUNTER — Encounter: Payer: Self-pay | Admitting: Family Medicine

## 2016-01-02 ENCOUNTER — Ambulatory Visit (INDEPENDENT_AMBULATORY_CARE_PROVIDER_SITE_OTHER): Payer: Medicaid Other | Admitting: Family Medicine

## 2016-01-02 VITALS — Temp 98.6°F | Wt 155.0 lb

## 2016-01-02 DIAGNOSIS — J111 Influenza due to unidentified influenza virus with other respiratory manifestations: Secondary | ICD-10-CM

## 2016-01-02 MED ORDER — OSELTAMIVIR PHOSPHATE 75 MG PO CAPS: 75.0000 mg | ORAL_CAPSULE | Freq: Two times a day (BID) | ORAL | Status: DC

## 2016-01-02 NOTE — Progress Notes (Signed)
   Subjective:    Patient ID: Lacey Hartman, female    DOB: May 14, 1996, 20 y.o.   MRN: [id number]  HPI Febrile illness past 36 hours head congestion drainage sore throat headache not feeling well denies nausea vomiting relates cough runny nose some ear discomfort   Review of Systems  Constitutional: Negative for fever and activity change.  HENT: Positive for congestion and rhinorrhea. Negative for ear pain.   Eyes: Negative for discharge.  Respiratory: Positive for cough. Negative for shortness of breath and wheezing.   Cardiovascular: Negative for chest pain.       Objective:   Physical Exam  Constitutional: She appears well-developed.  HENT:  Head: Normocephalic.  Nose: Nose normal.  Mouth/Throat: Oropharynx is clear and moist. No oropharyngeal exudate.  Neck: Neck supple.  Cardiovascular: Normal rate and normal heart sounds.   No murmur heard. Pulmonary/Chest: Effort normal and breath sounds normal. She has no wheezes.  Lymphadenopathy:    She has no cervical adenopathy.  Skin: Skin is warm and dry.  Nursing note and vitals reviewed.   Ears appear normal Patient not toxic     Assessment & Plan:  Influenza-the patient was diagnosed with influenza. Patient/family educated about the flu and warning signs to watch for. If difficulty breathing, severe neck pain and stiffness, cyanosis, disorientation, or progressive worsening then immediately get rechecked at that ER. If progressive symptoms be certain to be rechecked. Supportive measures such as Tylenol/ibuprofen was discussed. No aspirin use in children. And influenza home care instruction sheet was given. Tamiflu prescribed twice a day 5 days supportive measures discussed. Follow-up if worse go to ER if problems

## 2016-01-02 NOTE — Patient Instructions (Signed)

## 2016-01-05 ENCOUNTER — Telehealth (HOSPITAL_COMMUNITY): Payer: Self-pay | Admitting: *Deleted

## 2016-01-19 ENCOUNTER — Telehealth: Payer: Self-pay | Admitting: Family Medicine

## 2016-01-19 NOTE — Telephone Encounter (Signed)
Patient was seen 3/17 with the flu and calling about a letter you were suppose to write for her to send to social security.She states this was discuss at last visit but she couldn't tell me what was needed in the letter.

## 2016-01-20 ENCOUNTER — Encounter: Payer: Self-pay | Admitting: Family Medicine

## 2016-01-20 NOTE — Telephone Encounter (Signed)
Letter for Social Security dictated, please give to the patient

## 2016-03-12 DIAGNOSIS — Z029 Encounter for administrative examinations, unspecified: Secondary | ICD-10-CM

## 2016-03-30 ENCOUNTER — Ambulatory Visit (HOSPITAL_COMMUNITY): Payer: Self-pay | Admitting: Psychiatry

## 2016-06-10 ENCOUNTER — Encounter (HOSPITAL_COMMUNITY): Payer: Self-pay | Admitting: Psychiatry

## 2016-06-10 ENCOUNTER — Ambulatory Visit (INDEPENDENT_AMBULATORY_CARE_PROVIDER_SITE_OTHER): Payer: Medicaid Other | Admitting: Psychiatry

## 2016-06-10 VITALS — BP 114/69 | HR 62 | Ht 61.0 in | Wt 157.2 lb

## 2016-06-10 DIAGNOSIS — F909 Attention-deficit hyperactivity disorder, unspecified type: Secondary | ICD-10-CM | POA: Diagnosis not present

## 2016-06-10 DIAGNOSIS — F32A Depression, unspecified: Secondary | ICD-10-CM

## 2016-06-10 DIAGNOSIS — F329 Major depressive disorder, single episode, unspecified: Secondary | ICD-10-CM | POA: Insufficient documentation

## 2016-06-10 DIAGNOSIS — F988 Other specified behavioral and emotional disorders with onset usually occurring in childhood and adolescence: Secondary | ICD-10-CM

## 2016-06-10 MED ORDER — FLUOXETINE HCL 20 MG PO CAPS: 20.0000 mg | ORAL_CAPSULE | Freq: Every day | ORAL | 2 refills | Status: DC

## 2016-06-10 MED ORDER — METHYLPHENIDATE HCL ER (OSM) 36 MG PO TBCR: 36.0000 mg | EXTENDED_RELEASE_TABLET | Freq: Every day | ORAL | 0 refills | Status: DC

## 2016-06-10 NOTE — Progress Notes (Signed)
Psychiatric Initial Adult Assessment   Patient Identification: Lacey Hartman MRN:  [id number] Date of Evaluation:  06/10/2016 Referral Source: Dr. Sallee Lange Chief Complaint:   Chief Complaint    ADD; Depression; Establish Care     Visit Diagnosis:    ICD-9-CM ICD-10-CM   1. ADD (attention deficit disorder) 314.00 F90.9   2. Depression 311 F32.9     History of Present Illness: This patient is a 20 year old single white female lives with her boyfriend 2 daughters ages 74 and 25 months and one son age 70 month in [place]. 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  The patient was referred by her primary physician, Dr. Sallee Lange, for further assessment and treatment of ADD and depression.  The patient states that she has always struggled in school. She doesn't know much about her history but does know that she was born full-term but spent most of her first year of life in the NICU at [facility]. She is not sure why. She was diagnosed with hearing loss and required speech therapy although way through school. She had trouble learning to read and write and despite being an exceptional children's classes still does not read or write very well. When she was 20 years old in [place] she was very hyperactive and unfocused and distractible. At that time she was diagnosed with ADHD and was on Ritalin for a while and later Adderall. She stopped these medications when she became pregnant at age 34.  The patient states that she used to go to [facility] because of depression. The physician there put her on Prozac last year and she is still taking it although she has not been back to that clinic in quite some time. She states that she still has difficulties with bad temper crying spells anger anxiety and mood swings. At times she gets overwhelmed in caring for the children. Her husband has seizures and is on disability so he is able to be at home and help her most of the time. She also gets  anxious and nervous around new people. She denies auditory or visual hallucinations or paranoia. She does not use drugs or alcohol. She is now using an Implanon implant to prevent pregnancy as she has had so many children in such a short amount of time.  The patient states she is interested in getting back on medication for ADHD. She's no longer hyperactive but very unfocused distractible and on able to finish tasks.  Associated Signs/Symptoms: Depression Symptoms:  depressed mood, anhedonia, difficulty concentrating, anxiety, loss of energy/fatigue, disturbed sleep, (Hypo) Manic Symptoms:  Irritable Mood, Labiality of Mood, Anxiety Symptoms:  Social Anxiety, Psychotic Symptoms:  PTSD Symptoms:   Past Psychiatric History: She has been seen in the past at day [facility] for depression  Previous Psychotropic Medications: Yes   Substance Abuse History in the last 12 months:  No.  Consequences of Substance Abuse: NA  Past Medical History:  Past Medical History:  Diagnosis Date  . ADHD (attention deficit hyperactivity disorder)   . Asthma    inhaler prn  . Depression   . Hard of hearing     Past Surgical History:  Procedure Laterality Date  . NO PAST SURGERIES      Family Psychiatric History: The patient's mother has a history of depression anxiety and bipolar disorder. Both of her brothers have ADHD  Family History:  Family History  Problem Relation Age of Onset  . Depression Mother   . Anxiety disorder  Mother   . Bipolar disorder Mother   . Cancer Maternal Grandmother   . Cancer Maternal Uncle   . ADD / ADHD Brother   . ADD / ADHD Brother     Social History:   Social History   Social History  . Marital status: Single    Spouse name: N/A  . Number of children: N/A  . Years of education: N/A   Social History Main Topics  . Smoking status: Former Smoker    Years: 5.00    Types: Cigarettes  . Smokeless tobacco: Never Used     Comment: 06-10-16 per pt she stopped  May 2017  . Alcohol use No     Comment: 06-10-16 per pt no   . Drug use: No     Comment: 06-10-16 per pt no   . Sexual activity: Yes    Birth control/ protection: None, Implant   Other Topics Concern  . None   Social History Narrative  . None    Additional Social History:The patient grew up with both parents and 2 brothers. As noted above she had significant academic problems. Because she was in special classes she states that she was bullied. She left school in the ninth grade. She is never really worked and still has difficulty reading and writing. She got pregnant at 22 by another boyfriend but has been with her current boyfriend for 2 years and they've had 2 children together. She states that they have a good relationship. She doesn't spend much time with her biological family has only a few friends and little hobbies or interests  Allergies:   Allergies  Allergen Reactions  . Codeine Hives, Swelling and Rash    & hallucinations/    Metabolic Disorder Labs: No results found for: HGBA1C, MPG No results found for: PROLACTIN No results found for: CHOL, TRIG, HDL, CHOLHDL, VLDL, LDLCALC   Current Medications: Current Outpatient Prescriptions  Medication Sig Dispense Refill  . FLUoxetine (PROZAC) 20 MG capsule Take 1 capsule (20 mg total) by mouth daily. 30 capsule 2  . methylphenidate (CONCERTA) 36 MG PO CR tablet Take 1 tablet (36 mg total) by mouth daily. 30 tablet 0   No current facility-administered medications for this visit.     Neurologic: Headache: No Seizure: No Paresthesias:No  Musculoskeletal: Strength & Muscle Tone: within normal limits Gait & Station: normal Patient leans: N/A  Psychiatric Specialty Exam: Review of Systems  Psychiatric/Behavioral: Positive for depression. The patient is nervous/anxious.     Blood pressure 114/69, pulse 62, height 5\' 1"  (1.549 m), weight 157 lb 3.2 oz (71.3 kg), unknown if currently breastfeeding.Body mass index is 29.7  kg/m.  General Appearance: Casual and Disheveled  Eye Contact:  Fair  Speech:  Garbled and Slow  Volume:  Decreased  Mood:  Dysphoric  Affect:  Flat  Thought Process:  Goal Directed  Orientation:  Full (Time, Place, and Person)  Thought Content:  Rumination  Suicidal Thoughts:  No  Homicidal Thoughts:  No  Memory:  Immediate;   Fair Recent;   Poor Remote;   Poor  Judgement:  Fair  Insight:  Lacking  Psychomotor Activity:  Decreased  Concentration:  Concentration: Poor and Attention Span: Poor  Recall:  [place] of Knowledge:Poor  Language: Fair  Akathisia:  No  Handed:  Right  AIMS (if indicated):    Assets:  Communication Skills Desire for Improvement Physical Health Resilience Social Support  ADL's:  Intact  Cognition: WNL  Sleep:  Difficulty  getting to sleep     Treatment Plan Summary: Medication management   This patient is a 20 year old white female with a history of learning disabilities ADHD and hearing loss. Since she's been off medication she is struggling to stay focused. She states that she used to take Ritalin so I suggested a longer acting form such as Concerta and she agrees to try Concerta 36 mg every morning. She thinks the Prozac has helped her mood so she will continue Prozac 20 mg daily. We will try this combination for 4 weeks and then she will return to see me   Levonne Spiller, MD 8/24/20172:49 PM

## 2016-07-05 ENCOUNTER — Telehealth (HOSPITAL_COMMUNITY): Payer: Self-pay | Admitting: *Deleted

## 2016-07-05 NOTE — Telephone Encounter (Signed)
left voice message, provider out of office/07/09/16.

## 2016-07-09 ENCOUNTER — Ambulatory Visit (HOSPITAL_COMMUNITY): Payer: Self-pay | Admitting: Psychiatry

## 2016-07-20 ENCOUNTER — Ambulatory Visit (INDEPENDENT_AMBULATORY_CARE_PROVIDER_SITE_OTHER): Payer: Medicaid Other | Admitting: Psychiatry

## 2016-07-20 ENCOUNTER — Encounter (HOSPITAL_COMMUNITY): Payer: Self-pay | Admitting: Psychiatry

## 2016-07-20 VITALS — BP 116/79 | HR 90 | Ht 61.0 in | Wt 157.6 lb

## 2016-07-20 DIAGNOSIS — F320 Major depressive disorder, single episode, mild: Secondary | ICD-10-CM

## 2016-07-20 DIAGNOSIS — F900 Attention-deficit hyperactivity disorder, predominantly inattentive type: Secondary | ICD-10-CM

## 2016-07-20 MED ORDER — METHYLPHENIDATE HCL ER (OSM) 36 MG PO TBCR: 36.0000 mg | EXTENDED_RELEASE_TABLET | Freq: Every day | ORAL | 0 refills | Status: DC

## 2016-07-20 MED ORDER — FLUOXETINE HCL 20 MG PO CAPS: 20.0000 mg | ORAL_CAPSULE | Freq: Every day | ORAL | 2 refills | Status: DC

## 2016-07-20 NOTE — Progress Notes (Signed)
Psychiatric Initial Adult Assessment   Patient Identification: Lacey Hartman MRN:  [id number] Date of Evaluation:  07/20/2016 Referral Source: Dr. Sallee Lange Chief Complaint:   Chief Complaint    ADD; Depression     Visit Diagnosis:    ICD-9-CM ICD-10-CM   1. 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  The patient was referred by her primary physician, Dr. Sallee Lange, for further assessment and treatment of ADD and depression.  The patient states that she has always struggled in school. She doesn't know much about her history but does know that she was born full-term but spent most of her first year of life in the NICU at [facility]. She is not sure why. She was diagnosed with hearing loss and required speech therapy although way through school. She had trouble learning to read and write and despite being an exceptional children's classes still does not read or write very well. When she was 20 years old in [place] she was very hyperactive and unfocused and distractible. At that time she was diagnosed with ADHD and was on Ritalin for a while and later Adderall. She stopped these medications when she became pregnant at age 54.  The patient states that she used to go to [facility] because of depression. The physician there put her on Prozac last year and she is still taking it although she has not been back to that clinic in quite some time. She states that she still has difficulties with bad temper crying spells anger anxiety and mood swings. At times she gets overwhelmed in caring for the children. Her husband has seizures and is on  disability so he is able to be at home and help her most of the time. She also gets anxious and nervous around new people. She denies auditory or visual hallucinations or paranoia. She does not use drugs or alcohol. She is now using an Implanon implant to prevent pregnancy as she has had so many children in such a short amount of time.  The patient states she is interested in getting back on medication for ADHD. She's no longer hyperactive but very unfocused distractible and on able to finish tasks.  The patient returns after 2 months. She is now on Prozac as well as Concerta 36 mg every morning. Is really helped her focus and her attitude. She is more involved in activities at home and is no longer angry or irritable. She is not as overwhelmed. She's no longer crying. Overall she states that her mood is much improved. Her energy is good but she is sleeping well at night  Associated Signs/Symptoms: Depression Symptoms:  depressed mood, anhedonia, difficulty concentrating, anxiety, loss of energy/fatigue, disturbed sleep, (Hypo) Manic Symptoms:  Irritable Mood, Labiality of Mood, Anxiety Symptoms:  Social Anxiety, Psychotic Symptoms:  PTSD Symptoms:   Past Psychiatric History: She has been seen in the past at day [facility] for depression  Previous Psychotropic Medications: Yes   Substance Abuse History in the last 12 months:  No.  Consequences of Substance Abuse: NA  Past Medical History:  Past Medical History:  Diagnosis Date  . ADHD (attention deficit hyperactivity  disorder)   . Asthma    inhaler prn  . Depression   . Hard of hearing     Past Surgical History:  Procedure Laterality Date  . NO PAST SURGERIES      Family Psychiatric History: The patient's mother has a history of depression anxiety and bipolar disorder. Both of her brothers have ADHD  Family History:  Family History  Problem Relation Age of Onset  . Depression Mother   . Anxiety disorder Mother   . Bipolar  disorder Mother   . Cancer Maternal Grandmother   . Cancer Maternal Uncle   . ADD / ADHD Brother   . ADD / ADHD Brother     Social History:   Social History   Social History  . Marital status: Single    Spouse name: N/A  . Number of children: N/A  . Years of education: N/A   Social History Main Topics  . Smoking status: Former Smoker    Years: 5.00    Types: Cigarettes  . Smokeless tobacco: Never Used     Comment: 06-10-16 per pt she stopped May 2017  . Alcohol use No     Comment: 06-10-16 per pt no   . Drug use: No     Comment: 06-10-16 per pt no   . Sexual activity: Yes    Birth control/ protection: None, Implant   Other Topics Concern  . None   Social History Narrative  . None    Additional Social History:The patient grew up with both parents and 2 brothers. As noted above she had significant academic problems. Because she was in special classes she states that she was bullied. She left school in the ninth grade. She is never really worked and still has difficulty reading and writing. She got pregnant at 32 by another boyfriend but has been with her current boyfriend for 2 years and they've had 2 children together. She states that they have a good relationship. She doesn't spend much time with her biological family has only a few friends and little hobbies or interests  Allergies:   Allergies  Allergen Reactions  . Codeine Hives, Swelling and Rash    & hallucinations/    Metabolic Disorder Labs: No results found for: HGBA1C, MPG No results found for: PROLACTIN No results found for: CHOL, TRIG, HDL, CHOLHDL, VLDL, LDLCALC   Current Medications: Current Outpatient Prescriptions  Medication Sig Dispense Refill  . FLUoxetine (PROZAC) 20 MG capsule Take 1 capsule (20 mg total) by mouth daily. 30 capsule 2  . methylphenidate (CONCERTA) 36 MG PO CR tablet Take 1 tablet (36 mg total) by mouth daily. 30 tablet 0  . methylphenidate (CONCERTA) 36 MG PO CR tablet Take 1  tablet (36 mg total) by mouth daily. 30 tablet 0  . methylphenidate (CONCERTA) 36 MG PO CR tablet Take 1 tablet (36 mg total) by mouth daily. 30 tablet 0   No current facility-administered medications for this visit.     Neurologic: Headache: No Seizure: No Paresthesias:No  Musculoskeletal: Strength & Muscle Tone: within normal limits Gait & Station: normal Patient leans: N/A  Psychiatric Specialty Exam: Review of Systems  Psychiatric/Behavioral: Positive for depression. The patient is nervous/anxious.     Blood pressure 116/79, pulse 90, height 5\' 1"  (1.549 m), weight 157 lb 9.6 oz (71.5 kg), SpO2 99 %, unknown if currently breastfeeding.Body mass index is 29.78 kg/m.  General Appearance: Casual and Disheveled  Eye Contact:  Fair  Speech:  Garbled and Slow  Volume:  Decreased  Mood:  Good   Affect: Bright   Thought Process:  Goal Directed  Orientation:  Full (Time, Place, and Person)  Thought Content:  Rumination  Suicidal Thoughts:  No  Homicidal Thoughts:  No  Memory:  Immediate;   Fair Recent;   Poor Remote;   Poor  Judgement:  Fair  Insight:  Lacking  Psychomotor Activity:  Decreased  Concentration:  Concentration: Poor and Attention Span: Poor  Recall:  [employer] of Knowledge:Poor  Language: Fair  Akathisia:  No  Handed:  Right  AIMS (if indicated):    Assets:  Communication Skills Desire for Improvement Physical Health Resilience Social Support  ADL's:  Intact  Cognition: WNL  Sleep:  Difficulty getting to sleep     Treatment Plan Summary: Medication management   The patient will continue Prozac 20 mg daily for depression as well as Concerta 36 mg for ADD. She'll return in 3 months   Jonathan Corpus, Neoma Laming, MD 10/3/201711:31 AM

## 2016-08-20 ENCOUNTER — Telehealth: Payer: Self-pay | Admitting: Family Medicine

## 2016-08-20 NOTE — Telephone Encounter (Signed)
Patient has a cough, runny nose, and fever.  She doesn't want to come in the office to be seen, but would like advice on what she can take OTC?

## 2016-08-20 NOTE — Telephone Encounter (Signed)
Discussed patient symptoms with Dr.Scott Luking in real time and was told to inform the patient symptoms are probably viral and she may do symptomatic care if symptoms persist for 7 days or more will need to be seen. Spoke with patient and informed her of Dr.Scott advice and patient verbalized understanding.

## 2016-10-19 ENCOUNTER — Telehealth (HOSPITAL_COMMUNITY): Payer: Self-pay | Admitting: *Deleted

## 2016-10-19 NOTE — Telephone Encounter (Signed)
Patient left voice message, but it was not clear what she was saying.

## 2016-10-19 NOTE — Telephone Encounter (Signed)
Prior authorization for Methylphenidate ER received. Called [place] [facility] spoke with Caryl Pina who gave approval [id number] till 04/17/17. Called to notify pharmacy.

## 2016-10-19 NOTE — Telephone Encounter (Signed)
returned phone call, need to confirm what patient would like to do about her appointment for 10/20/16.

## 2016-10-20 ENCOUNTER — Encounter (HOSPITAL_COMMUNITY): Payer: Self-pay

## 2016-10-20 ENCOUNTER — Ambulatory Visit (HOSPITAL_COMMUNITY): Payer: Medicaid Other | Admitting: Psychiatry

## 2016-10-20 ENCOUNTER — Encounter (INDEPENDENT_AMBULATORY_CARE_PROVIDER_SITE_OTHER): Payer: Self-pay

## 2016-10-20 NOTE — Telephone Encounter (Signed)
noted 

## 2016-10-22 ENCOUNTER — Telehealth: Payer: Self-pay | Admitting: Family Medicine

## 2016-10-22 MED ORDER — OSELTAMIVIR PHOSPHATE 75 MG PO CAPS: 75.0000 mg | ORAL_CAPSULE | Freq: Two times a day (BID) | ORAL | 0 refills | Status: DC

## 2016-10-22 NOTE — Telephone Encounter (Signed)
It is difficult to know for certain. This sounds like the start of the flu. It is reasonable to send in prescription for Tamiflu 75 mg one twice a day for 5 days. If the patient has progressive or worsening symptoms then the patient will need to be seen. Certainly if worse over the weekend patient should consider going to the ER or urgent care

## 2016-10-22 NOTE — Telephone Encounter (Signed)
Patient has a sore throat, stopped up nose, cough, body aches.  She cannot come in the office today, but she would like advice on what she can buy at the store to help her.

## 2016-10-22 NOTE — Telephone Encounter (Signed)
Patient states that it is absolutely no way she can come in today. She is having cough, low grade fever, runny nose and body aches. No wheezing or shortness of breath. Eating and drinking well.

## 2016-10-22 NOTE — Telephone Encounter (Signed)
Notified patient it is difficult to know for certain. This sounds like the start of the flu. It is reasonable to send in prescription for Tamiflu 75 mg one twice a day for 5 days. If the patient has progressive or worsening symptoms then the patient will need to be seen. Certainly if worse over the weekend patient should consider going to the ER or urgent care. Patient verbalized understanding. Autumn sent in medication.

## 2016-11-11 ENCOUNTER — Encounter (HOSPITAL_COMMUNITY): Payer: Self-pay | Admitting: Psychiatry

## 2016-11-11 ENCOUNTER — Ambulatory Visit (INDEPENDENT_AMBULATORY_CARE_PROVIDER_SITE_OTHER): Payer: Medicaid Other | Admitting: Psychiatry

## 2016-11-11 VITALS — BP 118/79 | HR 67 | Ht 61.0 in | Wt 157.6 lb

## 2016-11-11 DIAGNOSIS — F320 Major depressive disorder, single episode, mild: Secondary | ICD-10-CM

## 2016-11-11 DIAGNOSIS — Z87891 Personal history of nicotine dependence: Secondary | ICD-10-CM

## 2016-11-11 DIAGNOSIS — F900 Attention-deficit hyperactivity disorder, predominantly inattentive type: Secondary | ICD-10-CM

## 2016-11-11 DIAGNOSIS — Z888 Allergy status to other drugs, medicaments and biological substances status: Secondary | ICD-10-CM | POA: Diagnosis not present

## 2016-11-11 DIAGNOSIS — Z79899 Other long term (current) drug therapy: Secondary | ICD-10-CM | POA: Diagnosis not present

## 2016-11-11 DIAGNOSIS — Z818 Family history of other mental and behavioral disorders: Secondary | ICD-10-CM

## 2016-11-11 DIAGNOSIS — Z808 Family history of malignant neoplasm of other organs or systems: Secondary | ICD-10-CM

## 2016-11-11 MED ORDER — METHYLPHENIDATE HCL ER (OSM) 36 MG PO TBCR: 36.0000 mg | EXTENDED_RELEASE_TABLET | Freq: Every day | ORAL | 0 refills | Status: DC

## 2016-11-11 MED ORDER — FLUOXETINE HCL 20 MG PO CAPS: 20.0000 mg | ORAL_CAPSULE | Freq: Every day | ORAL | 2 refills | Status: DC

## 2016-11-11 NOTE — Progress Notes (Signed)
Psychiatric Initial Adult Assessment   Patient Identification: Lacey Hartman MRN:  [id number] Date of Evaluation:  11/11/2016 Referral Source: Dr. Sallee Lange Chief Complaint:    Visit Diagnosis:    ICD-9-CM ICD-10-CM   1. Mild single current episode of major depressive disorder ([place]) 296.21 F32.0   2. Attention deficit hyperactivity disorder (ADHD), predominantly inattentive type 314.00 F90.0     History of Present Illness: This patient is a 21 year old single white female lives with her boyfriend 2 daughters ages 25 and 2 years and one son age 41 month in [place]. 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  The patient was referred by her primary physician, Dr. Sallee Lange, for further assessment and treatment of ADD and depression.  The patient states that she has always struggled in school. She doesn't know much about her history but does know that she was born full-term but spent most of her first year of life in the NICU at [facility]. She is not sure why. She was diagnosed with hearing loss and required speech therapy although way through school. She had trouble learning to read and write and despite being an exceptional children's classes still does not read or write very well. When she was 21 years old in [place] she was very hyperactive and unfocused and distractible. At that time she was diagnosed with ADHD and was on Ritalin for a while and later Adderall. She stopped these medications when she became pregnant at age 80.  The patient states that she used to go to [facility] because of depression. The physician there put her on Prozac last year and she is still taking it although she has not been back to that clinic in quite some time. She states that she still has difficulties with bad temper crying spells anger anxiety and mood swings. At times she gets overwhelmed in caring for the children. Her husband has seizures and is on disability so he is able to be at home and  help her most of the time. She also gets anxious and nervous around new people. She denies auditory or visual hallucinations or paranoia. She does not use drugs or alcohol. She is now using an Implanon implant to prevent pregnancy as she has had so many children in such a short amount of time.  The patient states she is interested in getting back on medication for ADHD. She's no longer hyperactive but very unfocused distractible and on able to finish tasks.  The patient returns after 3 months. For the most part she is doing well. She states that she got angry with her boyfriend because he went out with a friend last night and didn't tell her but overall they're getting along. She is sleeping well and her energy and focus are good. Her little boy is starting to take steps and she is very excited about this  Associated Signs/Symptoms: Depression Symptoms:  depressed mood, anhedonia, difficulty concentrating, anxiety, loss of energy/fatigue, disturbed sleep, (Hypo) Manic Symptoms:  Irritable Mood, Labiality of Mood, Anxiety Symptoms:  Social Anxiety, Psychotic Symptoms:  PTSD Symptoms:   Past Psychiatric History: She has been seen in the past at day [facility] for depression  Previous Psychotropic Medications: Yes   Substance Abuse History in the last 12 months:  No.  Consequences of Substance Abuse: NA  Past Medical History:  Past Medical History:  Diagnosis Date  . ADHD (attention deficit hyperactivity disorder)   . Asthma    inhaler prn  . Depression   .  Hard of hearing     Past Surgical History:  Procedure Laterality Date  . NO PAST SURGERIES      Family Psychiatric History: The patient's mother has a history of depression anxiety and bipolar disorder. Both of her brothers have ADHD  Family History:  Family History  Problem Relation Age of Onset  . Depression Mother   . Anxiety disorder Mother   . Bipolar disorder Mother   . Cancer Maternal Grandmother   . Cancer  Maternal Uncle   . ADD / ADHD Brother   . ADD / ADHD Brother     Social History:   Social History   Social History  . Marital status: Single    Spouse name: N/A  . Number of children: N/A  . Years of education: N/A   Social History Main Topics  . Smoking status: Former Smoker    Years: 5.00    Types: Cigarettes  . Smokeless tobacco: Never Used     Comment: 06-10-16 per pt she stopped May 2017  . Alcohol use No     Comment: 06-10-16 per pt no   . Drug use: No     Comment: 06-10-16 per pt no   . Sexual activity: Yes    Birth control/ protection: None, Implant   Other Topics Concern  . None   Social History Narrative  . None    Additional Social History:The patient grew up with both parents and 2 brothers. As noted above she had significant academic problems. Because she was in special classes she states that she was bullied. She left school in the ninth grade. She is never really worked and still has difficulty reading and writing. She got pregnant at 73 by another boyfriend but has been with her current boyfriend for 2 years and they've had 2 children together. She states that they have a good relationship. She doesn't spend much time with her biological family has only a few friends and little hobbies or interests  Allergies:   Allergies  Allergen Reactions  . Codeine Hives, Swelling and Rash    & hallucinations/    Metabolic Disorder Labs: No results found for: HGBA1C, MPG No results found for: PROLACTIN No results found for: CHOL, TRIG, HDL, CHOLHDL, VLDL, LDLCALC   Current Medications: Current Outpatient Prescriptions  Medication Sig Dispense Refill  . FLUoxetine (PROZAC) 20 MG capsule Take 1 capsule (20 mg total) by mouth daily. 30 capsule 2  . methylphenidate (CONCERTA) 36 MG PO CR tablet Take 1 tablet (36 mg total) by mouth daily. 30 tablet 0  . methylphenidate (CONCERTA) 36 MG PO CR tablet Take 1 tablet (36 mg total) by mouth daily. 30 tablet 0  .  methylphenidate (CONCERTA) 36 MG PO CR tablet Take 1 tablet (36 mg total) by mouth daily. 30 tablet 0   No current facility-administered medications for this visit.     Neurologic: Headache: No Seizure: No Paresthesias:No  Musculoskeletal: Strength & Muscle Tone: within normal limits Gait & Station: normal Patient leans: N/A  Psychiatric Specialty Exam: Review of Systems  Psychiatric/Behavioral: Positive for depression. The patient is nervous/anxious.     Blood pressure 118/79, pulse 67, height 5\' 1"  (1.549 m), weight 157 lb 9.6 oz (71.5 kg), unknown if currently breastfeeding.Body mass index is 29.78 kg/m.  General Appearance: Casual and Disheveled  Eye Contact:  Fair  Speech:  Garbled and Slow  Volume:  Decreased  Mood:  Good   Affect: Bright   Thought Process:  Goal Directed  Orientation:  Full (Time, Place, and Person)  Thought Content:  Rumination  Suicidal Thoughts:  No  Homicidal Thoughts:  No  Memory:  Immediate;   Fair Recent;   Poor Remote;   Poor  Judgement:  Fair  Insight:  Lacking  Psychomotor Activity:  Decreased  Concentration:  Concentration: Poor and Attention Span: Poor  Recall:  [employer] of Knowledge:Poor  Language: Fair  Akathisia:  No  Handed:  Right  AIMS (if indicated):    Assets:  Communication Skills Desire for Improvement Physical Health Resilience Social Support  ADL's:  Intact  Cognition: WNL  Sleep:  Difficulty getting to sleep     Treatment Plan Summary: Medication management   The patient will continue Prozac 20 mg daily for depression as well as Concerta 36 mg for ADD. She'll return in 3 months   Levonne Spiller, MD 1/25/20183:21 PM

## 2017-01-27 ENCOUNTER — Ambulatory Visit (INDEPENDENT_AMBULATORY_CARE_PROVIDER_SITE_OTHER): Payer: Medicaid Other | Admitting: Psychiatry

## 2017-01-27 ENCOUNTER — Encounter (HOSPITAL_COMMUNITY): Payer: Self-pay | Admitting: Psychiatry

## 2017-01-27 VITALS — BP 123/79 | HR 80 | Ht 61.0 in | Wt 158.8 lb

## 2017-01-27 DIAGNOSIS — Z87891 Personal history of nicotine dependence: Secondary | ICD-10-CM | POA: Diagnosis not present

## 2017-01-27 DIAGNOSIS — F900 Attention-deficit hyperactivity disorder, predominantly inattentive type: Secondary | ICD-10-CM

## 2017-01-27 DIAGNOSIS — Z818 Family history of other mental and behavioral disorders: Secondary | ICD-10-CM

## 2017-01-27 DIAGNOSIS — F320 Major depressive disorder, single episode, mild: Secondary | ICD-10-CM | POA: Diagnosis not present

## 2017-01-27 DIAGNOSIS — Z79899 Other long term (current) drug therapy: Secondary | ICD-10-CM | POA: Diagnosis not present

## 2017-01-27 MED ORDER — METHYLPHENIDATE HCL ER (OSM) 36 MG PO TBCR: 36.0000 mg | EXTENDED_RELEASE_TABLET | Freq: Every day | ORAL | 0 refills | Status: DC

## 2017-01-27 MED ORDER — FLUOXETINE HCL 20 MG PO CAPS: 20.0000 mg | ORAL_CAPSULE | Freq: Every day | ORAL | 2 refills | Status: DC

## 2017-01-27 NOTE — Progress Notes (Signed)
Psychiatric Initial Adult Assessment   Patient Identification: Lacey Hartman MRN:  [id number] Date of Evaluation:  01/27/2017 Referral Source: Dr. Sallee Lange Chief Complaint:   Chief Complaint    Depression; ADD; Follow-up     Visit Diagnosis:    ICD-9-CM ICD-10-CM   1. Mild single current episode of major depressive disorder ([place]) 296.21 F32.0   2. Attention deficit hyperactivity disorder (ADHD), predominantly inattentive type 314.00 F90.0     History of Present Illness: This patient is a 21 year old single white female lives with her boyfriend 2 daughters ages 40 and 2 years and one son age 55 month in [place]. 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  The patient was referred by her primary physician, Dr. Sallee Lange, for further assessment and treatment of ADD and depression.  The patient states that she has always struggled in school. She doesn't know much about her history but does know that she was born full-term but spent most of her first year of life in the NICU at [facility]. She is not sure why. She was diagnosed with hearing loss and required speech therapy although way through school. She had trouble learning to read and write and despite being an exceptional children's classes still does not read or write very well. When she was 21 years old in [place] she was very hyperactive and unfocused and distractible. At that time she was diagnosed with ADHD and was on Ritalin for a while and later Adderall. She stopped these medications when she became pregnant at age 53.  The patient states that she used to go to [facility] because of depression. The physician there put her on Prozac last year and she is still taking it although she has not been back to that clinic in quite some time. She states that she still has difficulties with bad temper crying spells anger anxiety and mood swings. At times she gets overwhelmed in caring for the children. Her husband has seizures and  is on disability so he is able to be at home and help her most of the time. She also gets anxious and nervous around new people. She denies auditory or visual hallucinations or paranoia. She does not use drugs or alcohol. She is now using an Implanon implant to prevent pregnancy as she has had so many children in such a short amount of time.  The patient states she is interested in getting back on medication for ADHD. She's no longer hyperactive but very unfocused distractible and on able to finish tasks.  The patient returns after 3 months. For the most part she is doing well. She states that she misplaced her Prozac and can't find it about a week ago and she ran out. She's been feeling a little bit anxious ever since. When she is on it however she feels good. She is focusing well with the Concerta and is doing well taking care of her children. Her boyfriend is selling cars now it is gone quite a bit but she thinks this is a good thing because they tend to bicker when they're together too much  Associated Signs/Symptoms: Depression Symptoms:  depressed mood, anhedonia, difficulty concentrating, anxiety, loss of energy/fatigue, disturbed sleep, (Hypo) Manic Symptoms:  Irritable Mood, Labiality of Mood, Anxiety Symptoms:  Social Anxiety, Psychotic Symptoms:  PTSD Symptoms:   Past Psychiatric History: She has been seen in the past at day [facility] for depression  Previous Psychotropic Medications: Yes   Substance Abuse History in the last  12 months:  No.  Consequences of Substance Abuse: NA  Past Medical History:  Past Medical History:  Diagnosis Date  . ADHD (attention deficit hyperactivity disorder)   . Asthma    inhaler prn  . Depression   . Hard of hearing     Past Surgical History:  Procedure Laterality Date  . NO PAST SURGERIES      Family Psychiatric History: The patient's mother has a history of depression anxiety and bipolar disorder. Both of her brothers have  ADHD  Family History:  Family History  Problem Relation Age of Onset  . Depression Mother   . Anxiety disorder Mother   . Bipolar disorder Mother   . Cancer Maternal Grandmother   . Cancer Maternal Uncle   . ADD / ADHD Brother   . ADD / ADHD Brother     Social History:   Social History   Social History  . Marital status: Single    Spouse name: N/A  . Number of children: N/A  . Years of education: N/A   Social History Main Topics  . Smoking status: Former Smoker    Years: 5.00    Types: Cigarettes  . Smokeless tobacco: Never Used     Comment: 06-10-16 per pt she stopped May 2017  . Alcohol use No     Comment: 06-10-16 per pt no   . Drug use: No     Comment: 06-10-16 per pt no   . Sexual activity: Yes    Birth control/ protection: None, Implant   Other Topics Concern  . None   Social History Narrative  . None    Additional Social History:The patient grew up with both parents and 2 brothers. As noted above she had significant academic problems. Because she was in special classes she states that she was bullied. She left school in the ninth grade. She is never really worked and still has difficulty reading and writing. She got pregnant at 37 by another boyfriend but has been with her current boyfriend for 2 years and they've had 2 children together. She states that they have a good relationship. She doesn't spend much time with her biological family has only a few friends and little hobbies or interests  Allergies:   Allergies  Allergen Reactions  . Codeine Hives, Swelling and Rash    & hallucinations/    Metabolic Disorder Labs: No results found for: HGBA1C, MPG No results found for: PROLACTIN No results found for: CHOL, TRIG, HDL, CHOLHDL, VLDL, LDLCALC   Current Medications: Current Outpatient Prescriptions  Medication Sig Dispense Refill  . FLUoxetine (PROZAC) 20 MG capsule Take 1 capsule (20 mg total) by mouth daily. 30 capsule 2  . methylphenidate  (CONCERTA) 36 MG PO CR tablet Take 1 tablet (36 mg total) by mouth daily. 30 tablet 0  . methylphenidate (CONCERTA) 36 MG PO CR tablet Take 1 tablet (36 mg total) by mouth daily. 30 tablet 0  . methylphenidate (CONCERTA) 36 MG PO CR tablet Take 1 tablet (36 mg total) by mouth daily. 30 tablet 0   No current facility-administered medications for this visit.     Neurologic: Headache: No Seizure: No Paresthesias:No  Musculoskeletal: Strength & Muscle Tone: within normal limits Gait & Station: normal Patient leans: N/A  Psychiatric Specialty Exam: Review of Systems  Psychiatric/Behavioral: Positive for depression. The patient is nervous/anxious.     Blood pressure 123/79, pulse 80, height 5\' 1"  (1.549 m), weight 158 lb 12.8 oz (72 kg), unknown if  currently breastfeeding.Body mass index is 30 kg/m.  General Appearance: Casual and Disheveled  Eye Contact:  Fair  Speech:  Garbled and Slow  Volume:  Decreased  Mood:  Good   Affect: Bright   Thought Process:  Goal Directed  Orientation:  Full (Time, Place, and Person)  Thought Content:  Rumination  Suicidal Thoughts:  No  Homicidal Thoughts:  No  Memory:  Immediate;   Fair Recent;   Poor Remote;   Poor  Judgement:  Fair  Insight:  Lacking  Psychomotor Activity:  Decreased  Concentration:  Concentration: Poor and Attention Span: Poor  Recall:  [employer] of Knowledge:Poor  Language: Fair  Akathisia:  No  Handed:  Right  AIMS (if indicated):    Assets:  Communication Skills Desire for Improvement Physical Health Resilience Social Support  ADL's:  Intact  Cognition: WNL  Sleep:  Difficulty getting to sleep     Treatment Plan Summary: Medication management   The patient will continue Prozac 20 mg daily for depression as well as Concerta 36 mg for ADD. She'll return in 3 months   Levonne Spiller, MD 4/12/20183:15 PM

## 2017-01-31 ENCOUNTER — Ambulatory Visit (HOSPITAL_COMMUNITY): Payer: Self-pay | Admitting: Psychiatry

## 2017-02-02 ENCOUNTER — Encounter: Payer: Self-pay | Admitting: Family Medicine

## 2017-02-02 ENCOUNTER — Ambulatory Visit (INDEPENDENT_AMBULATORY_CARE_PROVIDER_SITE_OTHER): Payer: Medicaid Other | Admitting: Family Medicine

## 2017-02-02 VITALS — BP 120/70 | Ht 61.0 in | Wt 160.4 lb

## 2017-02-02 DIAGNOSIS — M5431 Sciatica, right side: Secondary | ICD-10-CM

## 2017-02-02 DIAGNOSIS — R5383 Other fatigue: Secondary | ICD-10-CM

## 2017-02-02 DIAGNOSIS — Z0000 Encounter for general adult medical examination without abnormal findings: Secondary | ICD-10-CM | POA: Diagnosis not present

## 2017-02-02 NOTE — Progress Notes (Signed)
   Subjective:    Patient ID: Lacey Hartman, female    DOB: 26-Aug-1996, 21 y.o.   MRN: [id number]  HPI The patient comes in today for a wellness visit.  This patient actually has several vague she is concerned about. She once to have blood work to screen for cholesterol issues. Also screen for sugar issues. She also has a lot of fatigue and tiredness. She does need a letter of support in regards to taking care of her children Patient does have mild challenges but does a good job parenting Patient does have low back pain is been present for the past several months since having her child almost a year ago is in the right lower back radiates into the right leg no weakness or significant numbness just more pain  A review of their health history was completed.  A review of medications was also completed.  Any needed refills; None   Eating habits: Patient states that she does not eat all healthy.  Falls/  MVA accidents in past few months: None    Regular exercise: Does not exercise   Specialist pt sees on regular basis:See's Dr.Ross for ADHD and a mental Health specialist Lacey Hartman   Preventative health issues were discussed.   Additional concerns: None  Review of Systems  Constitutional: Positive for fatigue. Negative for fever.  HENT: Negative for congestion.   Respiratory: Negative for cough and shortness of breath.   Cardiovascular: Negative for chest pain.  Gastrointestinal: Negative for abdominal pain, diarrhea and nausea.  Musculoskeletal: Negative for back pain and gait problem.       Objective:   Physical Exam  Constitutional: She is oriented to person, place, and time. She appears well-developed and well-nourished.  HENT:  Head: Normocephalic.  Right Ear: External ear normal.  Left Ear: External ear normal.  Eyes: Pupils are equal, round, and reactive to light.  Neck: Normal range of motion. No thyromegaly present.  Cardiovascular: Normal rate, regular rhythm, normal  heart sounds and intact distal pulses.   No murmur heard. Pulmonary/Chest: Effort normal and breath sounds normal. No respiratory distress. She has no wheezes.  Abdominal: Soft. Bowel sounds are normal. She exhibits no distension and no mass. There is no tenderness.  Musculoskeletal: Normal range of motion. She exhibits no edema or tenderness.  Lymphadenopathy:    She has no cervical adenopathy.  Neurological: She is alert and oriented to person, place, and time. She exhibits normal muscle tone.  Skin: Skin is warm and dry.  Psychiatric: She has a normal mood and affect. Her behavior is normal.   Patient followed by specialist for depression       Assessment & Plan:  Adult wellness-complete.wellness physical was conducted today. Importance of diet and exercise were discussed in detail. In addition to this a discussion regarding safety was also covered. We also reviewed over immunizations and gave recommendations regarding current immunization needed for age. In addition to this additional areas were also touched on including: Preventative health exams needed: Colonoscopy not due for colonoscopy  Patient was advised yearly wellness exam  Fatigue tiredness check thyroid function Screening lab work looking at sugar as well as cholesterol Healthy diet recommended Sciatica exercises shown anti-inflammatories follow-up in 6 weeks if not doing dramatically better then consider possibility MRI referral physical therapy etc.

## 2017-02-02 NOTE — Patient Instructions (Signed)

## 2017-02-16 ENCOUNTER — Ambulatory Visit (HOSPITAL_COMMUNITY)
Admission: RE | Admit: 2017-02-16 | Discharge: 2017-02-16 | Disposition: A | Discharge status: Home / Self Care | Payer: Medicaid Other | Source: Ambulatory Visit | Attending: Family Medicine | Admitting: Family Medicine

## 2017-02-16 ENCOUNTER — Encounter (HOSPITAL_COMMUNITY): Payer: Self-pay | Admitting: Radiology

## 2017-02-16 DIAGNOSIS — M5431 Sciatica, right side: Secondary | ICD-10-CM | POA: Diagnosis present

## 2017-02-17 ENCOUNTER — Encounter: Payer: Self-pay | Admitting: Family Medicine

## 2017-02-17 ENCOUNTER — Telehealth: Payer: Self-pay | Admitting: Family Medicine

## 2017-02-17 LAB — LIPID PANEL
CHOL/HDL RATIO: 3.3 ratio (ref 0.0–4.4)
Cholesterol, Total: 139 mg/dL (ref 100–199)
HDL: 42 mg/dL (ref >39)
LDL Calculated: 85 mg/dL (ref 0–99)
Triglycerides: 61 mg/dL (ref 0–149)
VLDL Cholesterol Cal: 12 mg/dL (ref 5–40)

## 2017-02-17 LAB — BASIC METABOLIC PANEL
BUN / CREAT RATIO: 13 (ref 9–23)
BUN: 11 mg/dL (ref 6–20)
CALCIUM: 9.7 mg/dL (ref 8.7–10.2)
CHLORIDE: 103 mmol/L (ref 96–106)
CO2: 19 mmol/L (ref 18–29)
Creatinine, Ser: 0.85 mg/dL (ref 0.57–1.00)
GFR, EST AFRICAN AMERICAN: 112 mL/min/{1.73_m2} (ref >59)
GFR, EST NON AFRICAN AMERICAN: 98 mL/min/{1.73_m2} (ref >59)
Glucose: 85 mg/dL (ref 65–99)
Potassium: 4.6 mmol/L (ref 3.5–5.2)
Sodium: 141 mmol/L (ref 134–144)

## 2017-02-17 LAB — TSH: TSH: 1.7 u[IU]/mL (ref 0.450–4.500)

## 2017-02-17 NOTE — Telephone Encounter (Signed)
Spoke with patient and informed her per Dr.Scott Luking- overall good xrays normal. Stretching exercises recommended on a regular basis. Patient verbalized understanding and asked about results to blood work she had drawn on yesterday. Please advise

## 2017-02-17 NOTE — Telephone Encounter (Signed)
Her lab work overall looks very normal. We will be sending her a letter with complete details

## 2017-02-17 NOTE — Telephone Encounter (Signed)
Spoke with patient and informed her per Dr.Scott Luking- Your lab work overall looks very normal. We will be sending you a letter with complete details. Patient verbalized understanding

## 2017-02-17 NOTE — Telephone Encounter (Signed)
Requesting results to x-ray completed yesterday. °

## 2017-02-17 NOTE — Telephone Encounter (Signed)
Overall good xrays normal-stretching exercises recommended on a regular basis

## 2017-03-07 ENCOUNTER — Ambulatory Visit (INDEPENDENT_AMBULATORY_CARE_PROVIDER_SITE_OTHER): Payer: Medicaid Other | Admitting: Family Medicine

## 2017-03-07 ENCOUNTER — Encounter: Payer: Self-pay | Admitting: Family Medicine

## 2017-03-07 VITALS — Temp 98.3°F | Ht 61.0 in | Wt 160.8 lb

## 2017-03-07 DIAGNOSIS — B349 Viral infection, unspecified: Secondary | ICD-10-CM

## 2017-03-07 DIAGNOSIS — R509 Fever, unspecified: Secondary | ICD-10-CM | POA: Diagnosis not present

## 2017-03-07 NOTE — Progress Notes (Signed)
   Subjective:    Patient ID: Lacey Hartman, female    DOB: 1996/07/05, 21 y.o.   MRN: [id number]  Sinusitis  This is a new problem. The current episode started today. Associated symptoms include congestion and headaches. Pertinent negatives include no coughing, ear pain or shortness of breath. (Fever) Past treatments include acetaminophen.   Viral illness over the past couple days head congestion drainage coughing no wheezing no difficulty breathing some fever   Review of Systems  Constitutional: Positive for fatigue and fever. Negative for activity change.  HENT: Positive for congestion and rhinorrhea. Negative for ear pain.   Eyes: Negative for discharge.  Respiratory: Negative for cough, shortness of breath and wheezing.   Cardiovascular: Negative for chest pain.  Neurological: Positive for headaches.       Objective:   Physical Exam  Constitutional: She appears well-developed.  HENT:  Head: Normocephalic.  Nose: Nose normal.  Mouth/Throat: Oropharynx is clear and moist. No oropharyngeal exudate.  Neck: Neck supple.  Cardiovascular: Normal rate and normal heart sounds.   No murmur heard. Pulmonary/Chest: Effort normal and breath sounds normal. She has no wheezes.  Lymphadenopathy:    She has no cervical adenopathy.  Skin: Skin is warm and dry.  Nursing note and vitals reviewed.   Patient does not appear toxic      Assessment & Plan:  Viral syndrome No antibiotics indicated no lab testing indicated follow-up if ongoing troubles rest over the next few days

## 2017-03-16 ENCOUNTER — Telehealth: Payer: Self-pay | Admitting: *Deleted

## 2017-03-16 NOTE — Telephone Encounter (Signed)
Patient would like a refill of the cluster headache medication that the ER gave her but she is unsure of the name.  Patient would like something called in for cluster headaches.(whatever the doctor recommends)

## 2017-03-17 MED ORDER — RIZATRIPTAN BENZOATE 5 MG PO TABS: 5.0000 mg | ORAL_TABLET | ORAL | 2 refills | Status: DC | PRN

## 2017-03-17 NOTE — Telephone Encounter (Signed)
Left message return call 03/17/17

## 2017-03-17 NOTE — Addendum Note (Signed)
Addended by: Ofilia Neas R on: 03/17/2017 02:00 PM   Modules accepted: Orders

## 2017-03-17 NOTE — Telephone Encounter (Signed)
Try maxalt 5 mg, 1 at first sign and headache may repeat in 2 hours if necessary, #8, 2 refills, if ongoing headaches follow-up please

## 2017-03-21 NOTE — Telephone Encounter (Signed)
Patient notified prescription has been sent to pharmacy.

## 2017-04-28 ENCOUNTER — Ambulatory Visit (INDEPENDENT_AMBULATORY_CARE_PROVIDER_SITE_OTHER): Payer: Medicaid Other | Admitting: Psychiatry

## 2017-04-28 ENCOUNTER — Encounter (HOSPITAL_COMMUNITY): Payer: Self-pay | Admitting: Psychiatry

## 2017-04-28 VITALS — BP 115/58 | HR 84 | Ht 61.0 in | Wt 159.4 lb

## 2017-04-28 DIAGNOSIS — F900 Attention-deficit hyperactivity disorder, predominantly inattentive type: Secondary | ICD-10-CM

## 2017-04-28 DIAGNOSIS — F320 Major depressive disorder, single episode, mild: Secondary | ICD-10-CM

## 2017-04-28 MED ORDER — METHYLPHENIDATE HCL ER (OSM) 36 MG PO TBCR: 36.0000 mg | EXTENDED_RELEASE_TABLET | Freq: Every day | ORAL | 0 refills | Status: DC

## 2017-04-28 MED ORDER — FLUOXETINE HCL 20 MG PO CAPS: 20.0000 mg | ORAL_CAPSULE | Freq: Every day | ORAL | 2 refills | Status: DC

## 2017-04-28 NOTE — Progress Notes (Signed)
Psychiatric Initial Adult Assessment   Patient Identification: Lacey Hartman MRN:  [id number] Date of Evaluation:  04/28/2017 Referral Source: Dr. Sallee Lange Chief Complaint:   Chief Complaint    Depression; Anxiety; Follow-up     Visit Diagnosis:    ICD-10-CM   1. 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 Attention deficit hyperactivity disorder (ADHD), predominantly inattentive type F90.0     History of Present Illness: This patient is a 21 year old single white female lives with her boyfriend 2 daughters ages 56 and 2 years and one son age 69 month in [place]. 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  The patient was referred by her primary physician, Dr. Sallee Lange, for further assessment and treatment of ADD and depression.  The patient states that she has always struggled in school. She doesn't know much about her history but does know that she was born full-term but spent most of her first year of life in the NICU at [facility]. She is not sure why. She was diagnosed with hearing loss and required speech therapy although way through school. She had trouble learning to read and write and despite being an exceptional children's classes still does not read or write very well. When she was 21 years old in [place] she was very hyperactive and unfocused and distractible. At that time she was diagnosed with ADHD and was on Ritalin for a while and later Adderall. She stopped these medications when she became pregnant at age 46.  The patient states that she used to go to [facility] because of depression. The physician there put her on Prozac last year and she is still taking it although she has not been back to that clinic in quite some time. She states that she still has difficulties with bad temper crying spells anger anxiety and mood swings. At times she gets overwhelmed in caring for the children. Her husband has seizures and is on disability so  he is able to be at home and help her most of the time. She also gets anxious and nervous around new people. She denies auditory or visual hallucinations or paranoia. She does not use drugs or alcohol. She is now using an Implanon implant to prevent pregnancy as she has had so many children in such a short amount of time.  The patient states she is interested in getting back on medication for ADHD. She's no longer hyperactive but very unfocused distractible and on able to finish tasks.  The patient returns after 3 months. For the most part she is doing well. She states that her pharmacy would not really kill her Prozac and she's been out of it for about 2 weeks. This has happened before and she is going to switch pharmacies. When she is on the medicine she feels pretty good but off it she begins to get more depressed. She denies being suicidal. The Concerta continues to help her stay focused  Associated Signs/Symptoms: Depression Symptoms:  depressed mood, anhedonia, difficulty concentrating, anxiety, loss of energy/fatigue, disturbed sleep, (Hypo) Manic Symptoms:  Irritable Mood, Labiality of Mood, Anxiety Symptoms:  Social Anxiety, Psychotic Symptoms:  PTSD Symptoms:   Past Psychiatric History: She has been seen in the past at [facility] for [facility]  Previous Psychotropic Medications: Yes   Substance Abuse History in the last 12 months:  No.  Consequences of Substance Abuse: NA  Past Medical History:  Past Medical History:  Diagnosis Date  .  ADHD (attention deficit hyperactivity disorder)   . Asthma    inhaler prn  . Depression   . Hard of hearing     Past Surgical History:  Procedure Laterality Date  . NO PAST SURGERIES      Family Psychiatric History: The patient's mother has a history of depression anxiety and bipolar disorder. Both of her brothers have ADHD  Family History:  Family History  Problem Relation Age of Onset  . Depression Mother   . Anxiety disorder  Mother   . Bipolar disorder Mother   . Cancer Maternal Grandmother   . Cancer Maternal Uncle   . ADD / ADHD Brother   . ADD / ADHD Brother     Social History:   Social History   Social History  . Marital status: Single    Spouse name: N/A  . Number of children: N/A  . Years of education: N/A   Social History Main Topics  . Smoking status: Former Smoker    Years: 5.00    Types: Cigarettes  . Smokeless tobacco: Never Used     Comment: 06-10-16 per pt she stopped May 2017  . Alcohol use No     Comment: 06-10-16 per pt no   . Drug use: No     Comment: 06-10-16 per pt no   . Sexual activity: Yes    Birth control/ protection: None, Implant   Other Topics Concern  . Not on file   Social History Narrative  . No narrative on file    Additional Social History:The patient grew up with both parents and 2 brothers. As noted above she had significant academic problems. Because she was in special classes she states that she was bullied. She left school in the ninth grade. She is never really worked and still has difficulty reading and writing. She got pregnant at 56 by another boyfriend but has been with her current boyfriend for 2 years and they've had 2 children together. She states that they have a good relationship. She doesn't spend much time with her biological family has only a few friends and little hobbies or interests  Allergies:   Allergies  Allergen Reactions  . Codeine Hives, Swelling and Rash    & hallucinations/    Metabolic Disorder Labs: No results found for: HGBA1C, MPG No results found for: PROLACTIN Lab Results  Component Value Date   CHOL 139 02/16/2017   TRIG 61 02/16/2017   HDL 42 02/16/2017   CHOLHDL 3.3 02/16/2017   LDLCALC 85 02/16/2017     Current Medications: Current Outpatient Prescriptions  Medication Sig Dispense Refill  . FLUoxetine (PROZAC) 20 MG capsule Take 1 capsule (20 mg total) by mouth daily. 30 capsule 2  . methylphenidate  (CONCERTA) 36 MG PO CR tablet Take 1 tablet (36 mg total) by mouth daily. 30 tablet 0  . methylphenidate (CONCERTA) 36 MG PO CR tablet Take 1 tablet (36 mg total) by mouth daily. 30 tablet 0  . methylphenidate (CONCERTA) 36 MG PO CR tablet Take 1 tablet (36 mg total) by mouth daily. 30 tablet 0  . rizatriptan (MAXALT) 5 MG tablet Take 1 tablet (5 mg total) by mouth as needed for migraine. May repeat in 2 hours if needed 8 tablet 2   No current facility-administered medications for this visit.     Neurologic: Headache: No Seizure: No Paresthesias:No  Musculoskeletal: Strength & Muscle Tone: within normal limits Gait & Station: normal Patient leans: N/A  Psychiatric Specialty Exam: Review  of Systems  Psychiatric/Behavioral: Positive for depression. The patient is nervous/anxious.     Blood pressure (!) 115/58, pulse 84, height 5\' 1"  (1.549 m), weight 159 lb 6.4 oz (72.3 kg), unknown if currently breastfeeding.Body mass index is 30.12 kg/m.  General Appearance: Casual and Disheveled  Eye Contact:  Fair  Speech:  Garbled and Slow  Volume:  Decreased  Mood:  Good   Affect: A bit constricted   Thought Process:  Goal Directed  Orientation:  Full (Time, Place, and Person)  Thought Content:  Rumination  Suicidal Thoughts:  No  Homicidal Thoughts:  No  Memory:  Immediate;   Fair Recent;   Poor Remote;   Poor  Judgement:  Fair  Insight:  Lacking  Psychomotor Activity:  Decreased  Concentration:  Concentration: Poor and Attention Span: Poor  Recall:  [employer] of Knowledge:Poor  Language: Fair  Akathisia:  No  Handed:  Right  AIMS (if indicated):    Assets:  Communication Skills Desire for Improvement Physical Health Resilience Social Support  ADL's:  Intact  Cognition: WNL  Sleep:  Difficulty getting to sleep     Treatment Plan Summary: Medication management   The patient will continue Prozac 20 mg daily for depression as well as Concerta 36 mg for ADD. She'll  return in 3 months   Levonne Spiller, MD 7/12/20181:40 PM

## 2017-06-02 ENCOUNTER — Telehealth (HOSPITAL_COMMUNITY): Payer: Self-pay | Admitting: *Deleted

## 2017-06-02 NOTE — Telephone Encounter (Signed)
Pt called and lm stating that she looked on her Concerta bottle and it said not refills. Per pt she need refills for her medication. Staff called pt and informed her that per her chart, provider printed script for July, Aug and September. Per pt she turned in the entire paper that provider gived her and what should she do. Informed pt to call her pharmacy and see if they have it on hold or something and try to see if she can look around to see if she can find her script. Per pt she will and if she can not she can not find her script and the pharmacy do not have it she will call office back. Staff verbalized understanding.

## 2017-07-28 ENCOUNTER — Ambulatory Visit (HOSPITAL_COMMUNITY): Payer: Self-pay | Admitting: Psychiatry

## 2017-08-18 ENCOUNTER — Encounter (HOSPITAL_COMMUNITY): Payer: Self-pay | Admitting: Psychiatry

## 2017-08-18 ENCOUNTER — Ambulatory Visit (INDEPENDENT_AMBULATORY_CARE_PROVIDER_SITE_OTHER): Payer: Medicaid Other | Admitting: Psychiatry

## 2017-08-18 VITALS — BP 91/59 | HR 74 | Ht 61.0 in | Wt 158.8 lb

## 2017-08-18 DIAGNOSIS — F900 Attention-deficit hyperactivity disorder, predominantly inattentive type: Secondary | ICD-10-CM | POA: Diagnosis not present

## 2017-08-18 DIAGNOSIS — F320 Major depressive disorder, single episode, mild: Secondary | ICD-10-CM | POA: Diagnosis not present

## 2017-08-18 DIAGNOSIS — Z818 Family history of other mental and behavioral disorders: Secondary | ICD-10-CM

## 2017-08-18 MED ORDER — FLUOXETINE HCL 20 MG PO CAPS: 20.0000 mg | ORAL_CAPSULE | Freq: Every day | ORAL | 2 refills | Status: DC

## 2017-08-18 MED ORDER — METHYLPHENIDATE HCL ER (OSM) 36 MG PO TBCR: 36.0000 mg | EXTENDED_RELEASE_TABLET | Freq: Every day | ORAL | 0 refills | Status: DC

## 2017-08-18 NOTE — Progress Notes (Signed)
BH MD/PA/NP OP Progress Note  08/18/2017 3:05 PM Lacey Hartman  MRN:  [id number]  Chief Complaint:  Chief Complaint    Depression; ADHD; Follow-up     HPI: This patient is a 21 year old single white female lives with her boyfriend 2 daughters ages 81 and 2 years and one son age 20 month in [place]. 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  The patient was referred by her primary physician, Dr. Sallee Lange, for further assessment and treatment of ADD and depression.  The patient states that she has always struggled in school. She doesn't know much about her history but does know that she was born full-term but spent most of her first year of life in the NICU at [facility]. She is not sure why. She was diagnosed with hearing loss and required speech therapy although way through school. She had trouble learning to read and write and despite being an exceptional children's classes still does not read or write very well. When she was 21 years old in [place] she was very hyperactive and unfocused and distractible. At that time she was diagnosed with ADHD and was on Ritalin for a while and later Adderall. She stopped these medications when she became pregnant at age 21.  The patient states that she used to go to [facility] because of depression. The physician there put her on Prozac last year and she is still taking it although she has not been back to that clinic in quite some time. She states that she still has difficulties with bad temper crying spells anger anxiety and mood swings. At times she gets overwhelmed in caring for the children. Her husband has seizures and is on disability so he is able to be at home and help her most of the time. She also gets anxious and nervous around new people. She denies auditory or visual hallucinations or paranoia. She does not use drugs or alcohol. She is now using an Implanon implant to prevent pregnancy as she has had so many children in such a short  amount of time.  The patient states she is interested in getting back on medication for ADHD. She's no longer hyperactive but very unfocused distractible and on able to finish tasks.  The patient returns after 3 months.  For the most part she is doing well.  She is spending most of her time taking care of her children.  Her mood has been good.  She ran out of the methylphenidate a couple of days ago and she notices that she is more hyperactive and scattered.  She does quite well when she takes it and is able to stay on task and get things done at home.  She denies depression or thoughts of suicide or self-harm. Visit Diagnosis:    ICD-10-CM   1. 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 Attention deficit hyperactivity disorder (ADHD), predominantly inattentive type F90.0     Past Psychiatric History: Outpatient treatment for the past 2 years  Past Medical History:  Past Medical History:  Diagnosis Date  . ADHD (attention deficit hyperactivity disorder)   . Asthma    inhaler prn  . Depression   . Hard of hearing     Past Surgical History:  Procedure Laterality Date  . NO PAST SURGERIES      Family Psychiatric History: See below  Family History:  Family History  Problem Relation Age of Onset  . Depression Mother   .  Anxiety disorder Mother   . Bipolar disorder Mother   . Cancer Maternal Grandmother   . Cancer Maternal Uncle   . ADD / ADHD Brother   . ADD / ADHD Brother     Social History:  Social History   Social History  . Marital status: Single    Spouse name: N/A  . Number of children: N/A  . Years of education: N/A   Social History Main Topics  . Smoking status: Former Smoker    Years: 5.00    Types: Cigarettes  . Smokeless tobacco: Never Used     Comment: 06-10-16 per pt she stopped May 2017  . Alcohol use No     Comment: 06-10-16 per pt no   . Drug use: No     Comment: 06-10-16 per pt no   . Sexual activity: Yes    Birth  control/ protection: None, Implant   Other Topics Concern  . None   Social History Narrative  . None    Allergies:  Allergies  Allergen Reactions  . Codeine Hives, Swelling and Rash    & hallucinations/    Metabolic Disorder Labs: No results found for: HGBA1C, MPG No results found for: PROLACTIN Lab Results  Component Value Date   CHOL 139 02/16/2017   TRIG 61 02/16/2017   HDL 42 02/16/2017   CHOLHDL 3.3 02/16/2017   LDLCALC 85 02/16/2017   Lab Results  Component Value Date   TSH 1.700 02/16/2017    Therapeutic Level Labs: No results found for: LITHIUM No results found for: VALPROATE No components found for:  CBMZ  Current Medications: Current Outpatient Prescriptions  Medication Sig Dispense Refill  . FLUoxetine (PROZAC) 20 MG capsule Take 1 capsule (20 mg total) by mouth daily. 30 capsule 2  . methylphenidate (CONCERTA) 36 MG PO CR tablet Take 1 tablet (36 mg total) by mouth daily. 30 tablet 0  . rizatriptan (MAXALT) 5 MG tablet Take 1 tablet (5 mg total) by mouth as needed for migraine. May repeat in 2 hours if needed 8 tablet 2  . methylphenidate (CONCERTA) 36 MG PO CR tablet Take 1 tablet (36 mg total) by mouth daily. 30 tablet 0  . methylphenidate (CONCERTA) 36 MG PO CR tablet Take 1 tablet (36 mg total) by mouth daily. 30 tablet 0   No current facility-administered medications for this visit.      Musculoskeletal: Strength & Muscle Tone: within normal limits Gait & Station: normal Patient leans: N/A  Psychiatric Specialty Exam: Review of Systems  All other systems reviewed and are negative.   Blood pressure (!) 91/59, pulse 74, height 5\' 1"  (1.549 m), weight 158 lb 12.8 oz (72 kg), unknown if currently breastfeeding.Body mass index is 30 kg/m.  General Appearance: Casual and Fairly Groomed  Eye Contact:  Fair  Speech:  Garbled  Volume:  Normal  Mood:  Euthymic  Affect:  Appropriate  Thought Process:  Goal Directed  Orientation:  Full (Time,  Place, and Person)  Thought Content: WDL   Suicidal Thoughts:  No  Homicidal Thoughts:  No  Memory:  Immediate;   Good Recent;   Fair Remote;   Fair  Judgement:  Fair  Insight:  Fair  Psychomotor Activity:  Normal  Concentration:  Concentration: Poor and Attention Span: Poor improved on medication  Recall:  [employer] of Knowledge: Fair  Language: Fair  Akathisia:  No  Handed:  Right  AIMS (if indicated): not done  Assets:  Communication Skills Desire  for Improvement Physical Health Resilience Social Support  ADL's:  Intact  Cognition: WNL  Sleep:  Fair   Screenings: PHQ2-9     Office Visit from 02/02/2017 in [place]  PHQ-2 Total Score  3  PHQ-9 Total Score  8       Assessment and Plan: This patient is a 21 year old female with a history of early developmental delays and hearing loss.  She is done quite well at being a mother and homemaker particularly since she got on Concerta.  Concerta 54 mg is definitely helping with her focus and task completion.  She also denies any depressive symptoms on Prozac 20 mg daily.  She will continue these medications and return to see me in 3 months   Levonne Spiller, MD 08/18/2017, 3:05 PM

## 2017-11-18 ENCOUNTER — Ambulatory Visit (INDEPENDENT_AMBULATORY_CARE_PROVIDER_SITE_OTHER): Payer: Medicaid Other | Admitting: Psychiatry

## 2017-11-18 ENCOUNTER — Encounter (HOSPITAL_COMMUNITY): Payer: Self-pay | Admitting: Psychiatry

## 2017-11-18 VITALS — BP 109/73 | HR 67 | Ht 61.0 in | Wt 161.0 lb

## 2017-11-18 DIAGNOSIS — F900 Attention-deficit hyperactivity disorder, predominantly inattentive type: Secondary | ICD-10-CM

## 2017-11-18 DIAGNOSIS — Z79899 Other long term (current) drug therapy: Secondary | ICD-10-CM | POA: Diagnosis not present

## 2017-11-18 DIAGNOSIS — F320 Major depressive disorder, single episode, mild: Secondary | ICD-10-CM

## 2017-11-18 DIAGNOSIS — Z818 Family history of other mental and behavioral disorders: Secondary | ICD-10-CM | POA: Diagnosis not present

## 2017-11-18 DIAGNOSIS — Z87891 Personal history of nicotine dependence: Secondary | ICD-10-CM | POA: Diagnosis not present

## 2017-11-18 MED ORDER — METHYLPHENIDATE HCL ER (OSM) 36 MG PO TBCR: 36.0000 mg | EXTENDED_RELEASE_TABLET | Freq: Every day | ORAL | 0 refills | Status: DC

## 2017-11-18 MED ORDER — FLUOXETINE HCL 20 MG PO CAPS: 20.0000 mg | ORAL_CAPSULE | Freq: Every day | ORAL | 2 refills | Status: DC

## 2017-11-18 NOTE — Progress Notes (Signed)
BH MD/PA/NP OP Progress Note  11/18/2017 11:30 AM Lacey Hartman  MRN:  [id number]  Chief Complaint:  Chief Complaint    Depression; ADHD; Follow-up     HPI:  This patient is a 22 year old single white female lives with her boyfriend 2 daughters ages 97 and 39 years and one son age 39 in [place]. 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  The patient was referred by her primary physician, Dr. Sallee Lange, for further assessment and treatment of ADD and depression.  The patient states that she has always struggled in school. She doesn't know much about her history but does know that she was born full-term but spent most of her first year of life in the NICU at [facility]. She is not sure why. She was diagnosed with hearing loss and required speech therapy although way through school. She had trouble learning to read and write and despite being an exceptional children's classes still does not read or write very well. When she was 22 years old in [place] she was very hyperactive and unfocused and distractible. At that time she was diagnosed with ADHD and was on Ritalin for a while and later Adderall. She stopped these medications when she became pregnant at age 89.  The patient states that she used to go to [facility] because of depression. The physician there put her on Prozac last year and she is still taking it although she has not been back to that clinic in quite some time. She states that she still has difficulties with bad temper crying spells anger anxiety and mood swings. At times she gets overwhelmed in caring for the children. Her husband has seizures and is on disability so he is able to be at home and help her most of the time. She also gets anxious and nervous around new people. She denies auditory or visual hallucinations or paranoia. She does not use drugs or alcohol. She is now using an Implanon implant to prevent pregnancy as she has had so many children in such a short  amount of time.  The patient states she is interested in getting back on medication for ADHD. She's no longer hyperactive but very unfocused distractible and on able to finish tasks.  Patient returns after 3 months.  She states that she is doing well.  Her mood has been good and she has plenty of energy to keep up with her children.  She is focusing well with the Concerta.  Prozac has helped her mood she is sleeping well denies any current side effects.  She got a computer for Christmas and is now thinking of taking GED classes online Visit Diagnosis:    ICD-10-CM   1. Attention deficit hyperactivity disorder (ADHD), predominantly inattentive type F90.0   2. Current mild episode of major depressive disorder without prior episode ([place]) F32.0     Past Psychiatric History: none  Past Medical History:  Past Medical History:  Diagnosis Date  . ADHD (attention deficit hyperactivity disorder)   . Asthma    inhaler prn  . Depression   . Hard of hearing     Past Surgical History:  Procedure Laterality Date  . NO PAST SURGERIES      Family Psychiatric History: Below  Family History:  Family History  Problem Relation Age of Onset  . Depression Mother   . Anxiety disorder Mother   . Bipolar disorder Mother   . Cancer Maternal Grandmother   . Cancer Maternal Uncle   .  ADD / ADHD Brother   . ADD / ADHD Brother     Social History:  Social History   Socioeconomic History  . Marital status: Single    Spouse name: None  . Number of children: None  . Years of education: None  . Highest education level: None  Social Needs  . Financial resource strain: None  . Food insecurity - worry: None  . Food insecurity - inability: None  . Transportation needs - medical: None  . Transportation needs - non-medical: None  Occupational History  . None  Tobacco Use  . Smoking status: Former Smoker    Years: 5.00    Types: Cigarettes  . Smokeless tobacco: Never Used  . Tobacco comment:  06-10-16 per pt she stopped May 2017  Substance and Sexual Activity  . Alcohol use: No    Alcohol/week: 0.0 oz    Comment: 06-10-16 per pt no   . Drug use: No    Comment: 06-10-16 per pt no   . Sexual activity: Yes    Birth control/protection: None, Implant  Other Topics Concern  . None  Social History Narrative  . None    Allergies:  Allergies  Allergen Reactions  . Codeine Hives, Swelling and Rash    & hallucinations/    Metabolic Disorder Labs: No results found for: HGBA1C, MPG No results found for: PROLACTIN Lab Results  Component Value Date   CHOL 139 02/16/2017   TRIG 61 02/16/2017   HDL 42 02/16/2017   CHOLHDL 3.3 02/16/2017   LDLCALC 85 02/16/2017   Lab Results  Component Value Date   TSH 1.700 02/16/2017    Therapeutic Level Labs: No results found for: LITHIUM No results found for: VALPROATE No components found for:  CBMZ  Current Medications: Current Outpatient Medications  Medication Sig Dispense Refill  . FLUoxetine (PROZAC) 20 MG capsule Take 1 capsule (20 mg total) by mouth daily. 30 capsule 2  . methylphenidate (CONCERTA) 36 MG PO CR tablet Take 1 tablet (36 mg total) by mouth daily. 30 tablet 0  . methylphenidate (CONCERTA) 36 MG PO CR tablet Take 1 tablet (36 mg total) by mouth daily. 30 tablet 0  . methylphenidate (CONCERTA) 36 MG PO CR tablet Take 1 tablet (36 mg total) by mouth daily. 30 tablet 0  . rizatriptan (MAXALT) 5 MG tablet Take 1 tablet (5 mg total) by mouth as needed for migraine. May repeat in 2 hours if needed 8 tablet 2   No current facility-administered medications for this visit.      Musculoskeletal: Strength & Muscle Tone: within normal limits Gait & Station: normal Patient leans: N/A  Psychiatric Specialty Exam: Review of Systems  All other systems reviewed and are negative.   Blood pressure 109/73, pulse 67, height 5\' 1"  (1.549 m), weight 161 lb (73 kg), SpO2 98 %, unknown if currently breastfeeding.Body mass index  is 30.42 kg/m.  General Appearance: Casual and Fairly Groomed  Eye Contact:  Good  Speech:  Garbled  Volume:  Normal  Mood:  Euthymic  Affect:  Congruent  Thought Process:  Goal Directed  Orientation:  Full (Time, Place, and Person)  Thought Content: WDL   Suicidal Thoughts:  No  Homicidal Thoughts:  No  Memory:  Immediate;   Good Recent;   Good Remote;   Fair  Judgement:  Fair  Insight:  Lacking  Psychomotor Activity:  Normal  Concentration:  Concentration: Good and Attention Span: Good  Recall:  Good  Fund of Knowledge:  Fair  Language: Fair  Akathisia:  No  Handed:  Right  AIMS (if indicated): not done  Assets:  Communication Skills Desire for Improvement Resilience Social Support  ADL's:  Intact  Cognition: Impaired,  Mild  Sleep:  Good   Screenings: PHQ2-9     Office Visit from 02/02/2017 in [place]  PHQ-2 Total Score  3  PHQ-9 Total Score  8       Assessment and Plan: This patient is a 22 year old female who has had difficulties with learning disabilities ADHD and depression.  Her mood is doing very well with Prozac 20 mg daily and she is focused with the Concerta 36 mg every morning.  She states that she is feeling great.  She is optimistic about returning to a GED program.  She will continue her current medications and return to see me in 3 months   Levonne Spiller, MD 11/18/2017, 11:30 AM

## 2017-12-15 ENCOUNTER — Other Ambulatory Visit: Payer: Self-pay | Admitting: *Deleted

## 2017-12-15 MED ORDER — OSELTAMIVIR PHOSPHATE 75 MG PO CAPS: 75.0000 mg | ORAL_CAPSULE | Freq: Two times a day (BID) | ORAL | 0 refills | Status: DC

## 2018-01-24 IMAGING — DX DG LUMBAR SPINE COMPLETE 4+V
5 series · 5 of 5 positions shown · non-contrast
Comparison: None.

CLINICAL DATA: Lumbago with right lower extremity radicular
symptoms, chronic

EXAM:
LUMBAR SPINE - COMPLETE 4+ VIEW

[l-spine ap]
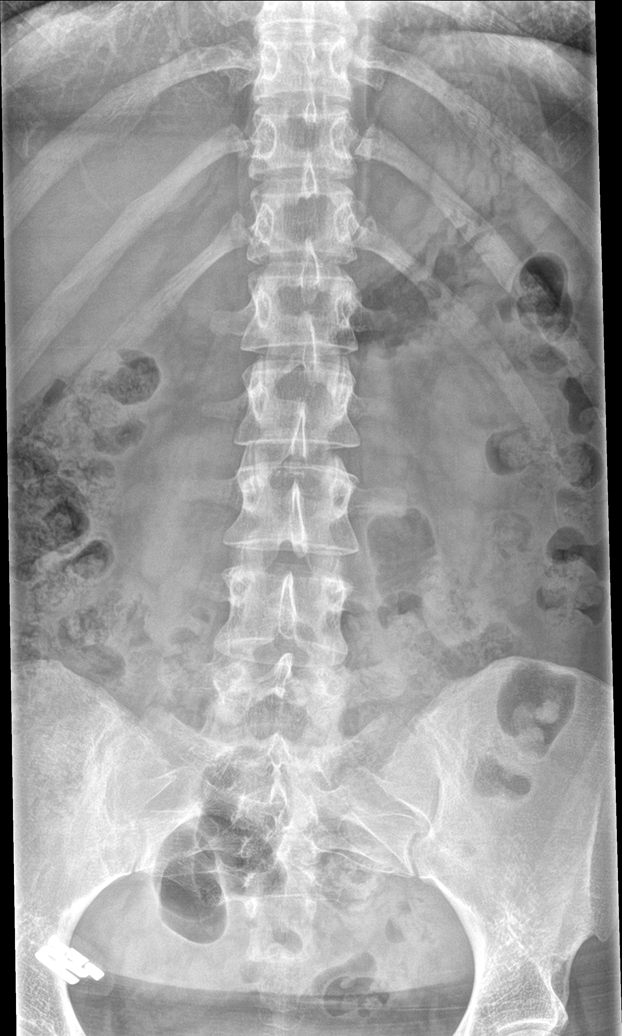

[l-spine obl (1 of 2)]
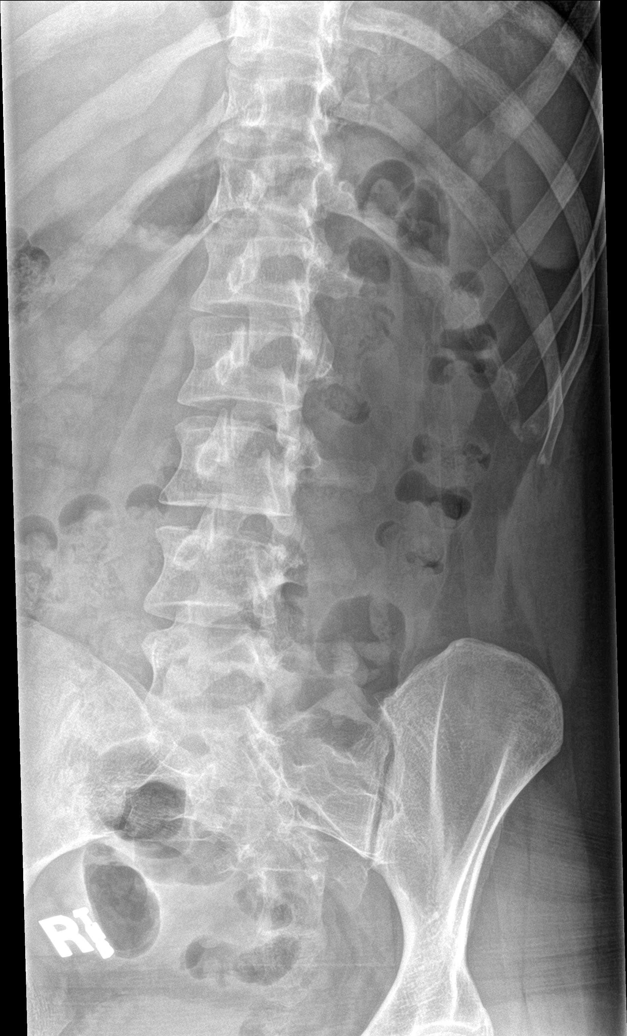

[l-spine obl (2 of 2)]
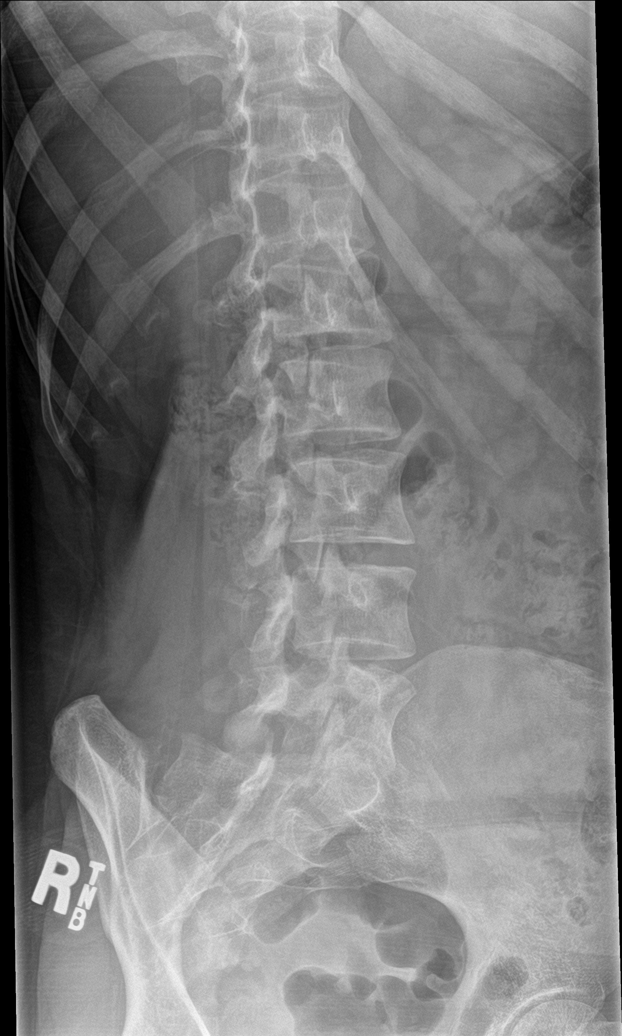

[l-spine lat]
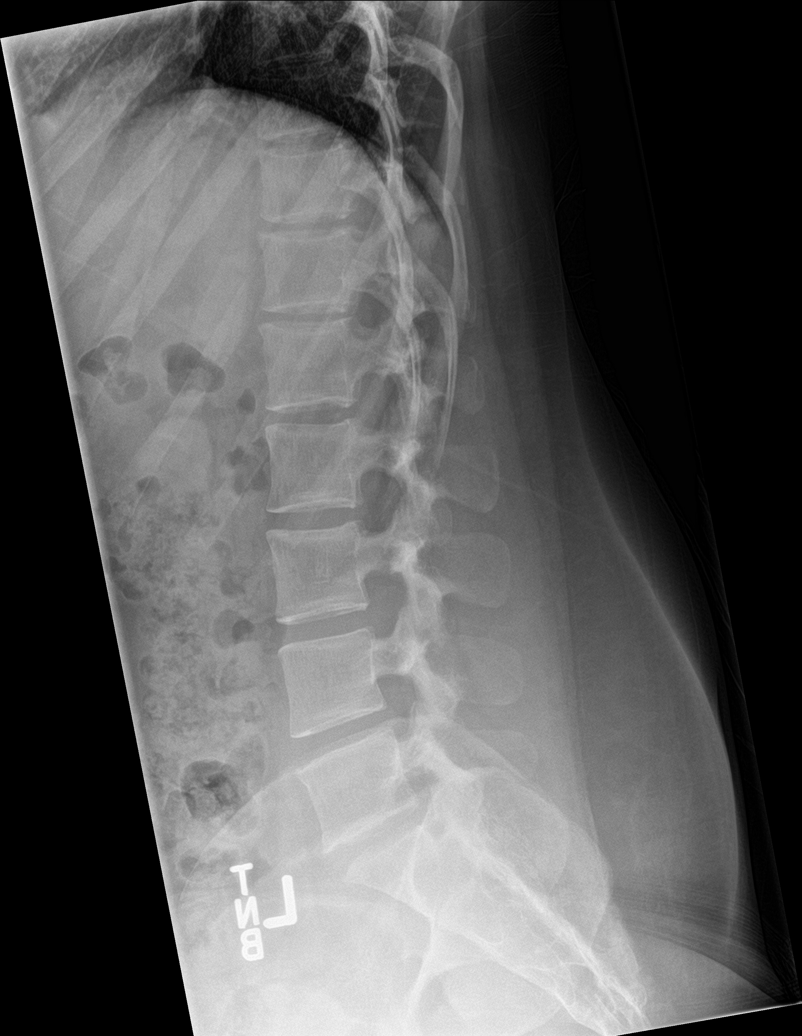

[l-spine spot]
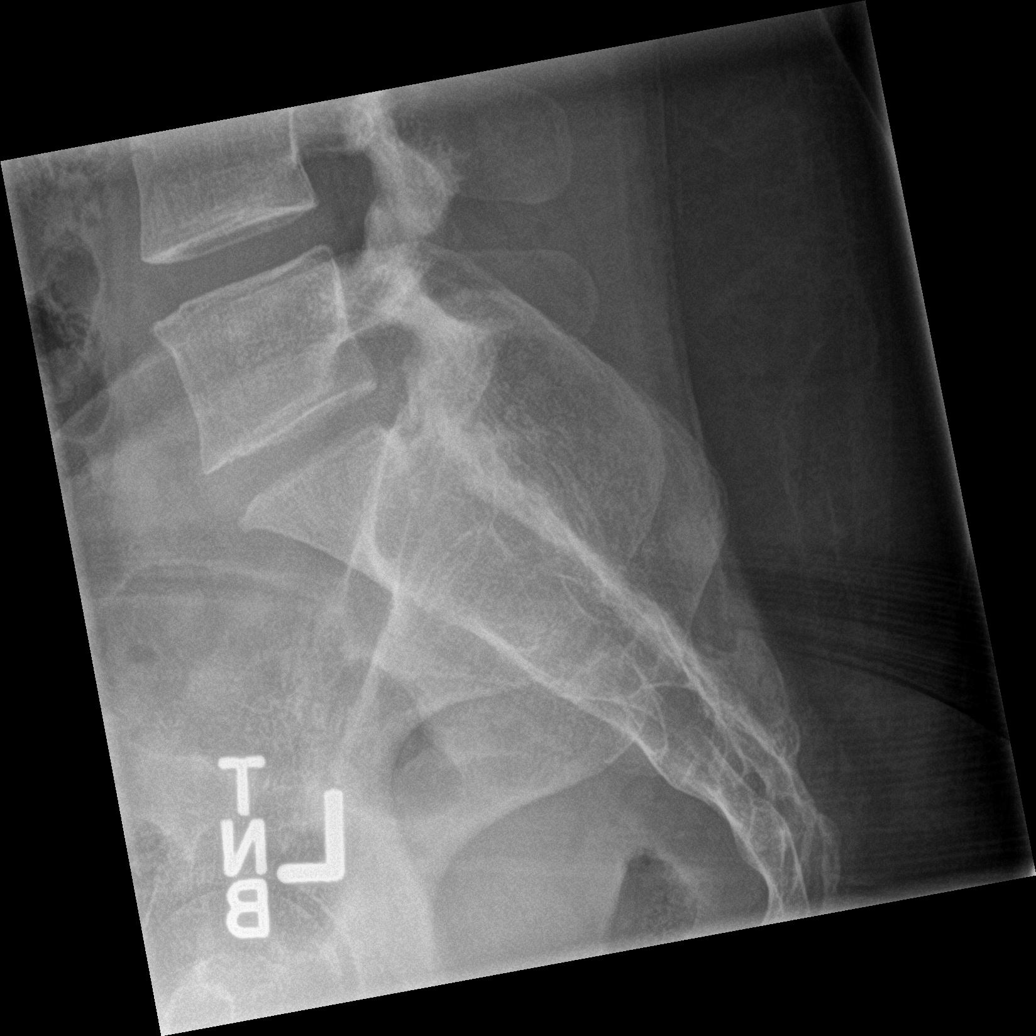

[5 of 5 positions shown; findings below may reference images not displayed]

FINDINGS: Frontal, lateral, spot lumbosacral lateral, and bilateral oblique
views were obtained. There are 5 non-rib-bearing lumbar type
vertebral bodies. There is no fracture or spondylolisthesis. The
disc spaces appear unremarkable. There is no appreciable facet
arthropathy.
IMPRESSION: No fracture or spondylolisthesis.  No appreciable arthropathy.

## 2018-02-11 DIAGNOSIS — W1842XA Slipping, tripping and stumbling without falling due to stepping into hole or opening, initial encounter: Secondary | ICD-10-CM | POA: Diagnosis not present

## 2018-02-11 DIAGNOSIS — Y999 Unspecified external cause status: Secondary | ICD-10-CM | POA: Insufficient documentation

## 2018-02-11 DIAGNOSIS — Z87891 Personal history of nicotine dependence: Secondary | ICD-10-CM | POA: Diagnosis not present

## 2018-02-11 DIAGNOSIS — Z79899 Other long term (current) drug therapy: Secondary | ICD-10-CM | POA: Diagnosis not present

## 2018-02-11 DIAGNOSIS — Y9389 Activity, other specified: Secondary | ICD-10-CM | POA: Diagnosis not present

## 2018-02-11 DIAGNOSIS — S8991XA Unspecified injury of right lower leg, initial encounter: Secondary | ICD-10-CM | POA: Diagnosis present

## 2018-02-11 DIAGNOSIS — S8011XA Contusion of right lower leg, initial encounter: Secondary | ICD-10-CM | POA: Insufficient documentation

## 2018-02-11 DIAGNOSIS — J45909 Unspecified asthma, uncomplicated: Secondary | ICD-10-CM | POA: Insufficient documentation

## 2018-02-11 DIAGNOSIS — Y92838 Other recreation area as the place of occurrence of the external cause: Secondary | ICD-10-CM | POA: Insufficient documentation

## 2018-02-11 NOTE — ED Notes (Signed)
Patient to stat desk via wheelchair by [facility] after a fall.  Per EMS patient was walking and tripped over man hole cover.  EMS reports patient with abrasiion to right shin area, no deformity noted. Right leg placed in splint for comfort.  VS:  p-98, BP-130/92, rr-16, pulse oxi 98% on room air.

## 2018-02-11 NOTE — ED Triage Notes (Signed)
Patient tripped and fell.  Patient reports right lower leg pain.

## 2018-02-12 ENCOUNTER — Emergency Department: Payer: Medicaid Other

## 2018-02-12 ENCOUNTER — Other Ambulatory Visit: Payer: Self-pay

## 2018-02-12 ENCOUNTER — Emergency Department
Admission: EM | Admit: 2018-02-12 | Discharge: 2018-02-12 | Disposition: A | Discharge status: Home / Self Care | Payer: Medicaid Other | Attending: Emergency Medicine | Admitting: Emergency Medicine

## 2018-02-12 DIAGNOSIS — S8011XA Contusion of right lower leg, initial encounter: Secondary | ICD-10-CM

## 2018-02-12 MED ORDER — IBUPROFEN 600 MG PO TABS
600.0000 mg | ORAL_TABLET | Freq: Once | ORAL | Status: AC
  Administered 2018-02-12: 600 mg via ORAL
  Filled 2018-02-12: qty 1

## 2018-02-12 NOTE — ED Notes (Signed)
Report to megan, rn.  

## 2018-02-12 NOTE — ED Notes (Signed)
NAD noted at time of D/C. Pt denies questions or concerns. Pt taken to the lobby via wheelchair at this time.  

## 2018-02-12 NOTE — ED Notes (Signed)
Ace Wrap applied by this RN. Crutches teaching given by this RN and Arlyss Repress, Advertising copywriter. Pt verbalizes good understanding and is able to provide adequate demonstration. Pt denies questions or concerns regarding use of crutches.

## 2018-02-12 NOTE — ED Notes (Signed)
Pt states she [place] injuring right lower extremity. Pt complains of pain from right toes to right upper tib fib. Cms intact to toes. Abrasion noted without bleeding to mid tibial area. Pt has a sam splint from ems in place to extremity.

## 2018-02-12 NOTE — ED Provider Notes (Signed)
[facility] Emergency Department Provider Note  ____________________________________________   First MD Initiated Contact with Patient 02/12/18 0654     (approximate)  I have reviewed the triage vital signs and the nursing notes.   HISTORY  Chief Complaint Fall and Leg Injury    HPI Lacey Hartman is a 22 y.o. female with medical history as listed below who presents for evaluation of pain in her right shin after a fall.  She reports that she was playing during the night at a truck stop playground when she stepped in a hole and hurt the front of her right lower leg.  She did not strike her head and did not land on her arms, and she does not complain of any other pain.  She states that the pain in her right lower leg is severe and walking on it makes it worse.  There is a little bit of swelling and bruising and an abrasion.  She denies headache, neck pain, shortness of breath, abdominal pain, nausea, and vomiting.  Past Medical History:  Diagnosis Date  . ADHD (attention deficit hyperactivity disorder)   . Asthma    inhaler prn  . Depression   . Hard of hearing     Patient Active Problem List   Diagnosis Date Noted  . Depression 06/10/2016  . ADD (attention deficit disorder) 12/24/2015  . Smoker 12/03/2013    Past Surgical History:  Procedure Laterality Date  . NO PAST SURGERIES      Prior to Admission medications   Medication Sig Start Date End Date Taking? Authorizing Provider  FLUoxetine (PROZAC) 20 MG capsule Take 1 capsule (20 mg total) by mouth daily. 11/18/17 11/18/18  Cloria Spring, MD  methylphenidate (CONCERTA) 36 MG PO CR tablet Take 1 tablet (36 mg total) by mouth daily. 11/18/17 11/18/18  Cloria Spring, MD  methylphenidate (CONCERTA) 36 MG PO CR tablet Take 1 tablet (36 mg total) by mouth daily. 11/18/17 11/18/18  Cloria Spring, MD  methylphenidate (CONCERTA) 36 MG PO CR tablet Take 1 tablet (36 mg total) by mouth daily. 11/18/17 11/18/18   Cloria Spring, MD  oseltamivir (TAMIFLU) 75 MG capsule Take 1 capsule (75 mg total) by mouth 2 (two) times daily. 12/15/17   Kathyrn Drown, MD  rizatriptan (MAXALT) 5 MG tablet Take 1 tablet (5 mg total) by mouth as needed for migraine. May repeat in 2 hours if needed 03/17/17   Kathyrn Drown, MD    Allergies Codeine  Family History  Problem Relation Age of Onset  . Depression Mother   . Anxiety disorder Mother   . Bipolar disorder Mother   . Cancer Maternal Grandmother   . Cancer Maternal Uncle   . ADD / ADHD Brother   . ADD / ADHD Brother     Social History Social History   Tobacco Use  . Smoking status: Former Smoker    Years: 5.00    Types: Cigarettes  . Smokeless tobacco: Never Used  . Tobacco comment: 06-10-16 per pt she stopped May 2017  Substance Use Topics  . Alcohol use: No    Alcohol/week: 0.0 oz    Comment: 06-10-16 per pt no   . Drug use: No    Comment: 06-10-16 per pt no     Review of Systems Constitutional: No fever/chills Cardiovascular: Denies chest pain. Respiratory: Denies shortness of breath. Gastrointestinal: No abdominal pain.  No nausea, no vomiting.   Genitourinary: Negative for dysuria. Musculoskeletal: Pain  in right anterior lower leg as described above Integumentary: Negative for rash. Neurological: Negative for headaches, focal weakness or numbness.   ____________________________________________   PHYSICAL EXAM:  VITAL SIGNS: ED Triage Vitals [02/12/18 0000]  Enc Vitals Group     BP 127/82     Pulse Rate (!) 104     Resp 18     Temp 98.2 F (36.8 C)     Temp Source Oral     SpO2 98 %     Weight 78 kg (172 lb)     Height 1.524 m (5')     Head Circumference      Peak Flow      Pain Score 8     Pain Loc      Pain Edu?      Excl. in [place]?     Constitutional: Alert and oriented. Well appearing and in no acute distress. Eyes: Conjunctivae are normal.  Head: Atraumatic. Cardiovascular: Normal rate, regular rhythm. Good  peripheral circulation.  Respiratory: Normal respiratory effort.  No retractions.   Musculoskeletal: The patient has an abrasion on the middle of her anterior right lower leg with a small bit of bruising.  There is no significant hematoma and no palpable deformity.  The patient reports severe tenderness with even light touch.  The leg is neurovascularly intact with a warm foot and palpable pulses. Neurologic:  Normal speech and language. No gross focal neurologic deficits are appreciated.  Skin:  Skin is warm, dry and intact. No rash noted.   ____________________________________________   LABS (all labs ordered are listed, but only abnormal results are displayed)  Labs Reviewed - No data to display ____________________________________________  EKG  None - EKG not ordered by ED physician ____________________________________________  RADIOLOGY I, Hinda Kehr, personally viewed and evaluated these images (plain radiographs) as part of my medical decision making, as well as reviewing the written report by the radiologist.  ED MD interpretation: No indication of any bony fracture nor dislocation  Official radiology report(s): Dg Tibia/fibula Right  Result Date: 02/12/2018 CLINICAL DATA:  Trip and fall injury.  Right lower leg pain. EXAM: RIGHT TIBIA AND FIBULA - 2 VIEW COMPARISON:  None. FINDINGS: Right tibia and fibula appear intact. No evidence of acute fracture or subluxation. No focal bone lesion or bone destruction. Bone cortex and trabecular architecture appear intact. Focal soft tissue infiltration in the subcutaneous fat over the anterior midshaft tibia consistent with soft tissue contusion. Soft tissue calcifications likely representing phleboliths. IMPRESSION: No acute bony abnormality. Focal soft tissue contusion in the anterior to the mid tibia. Electronically Signed   By: Lucienne Capers M.D.   On: 02/12/2018 00:47     ____________________________________________   PROCEDURES  Critical Care performed: No   Procedure(s) performed:   Procedures   ____________________________________________   INITIAL IMPRESSION / ASSESSMENT AND PLAN / ED COURSE  As part of my medical decision making, I reviewed the following data within the [place] notes reviewed and incorporated and Radiograph reviewed     No indication of acute or emergent abnormality.  The patient has a hematoma and contusion to the front of her right shin but no evidence of emergent medical condition. RICE, OTC analgesia, crutches if absolutely necessary if she feels that she cannot bear weight, but I encouraged her to return quickly to normal mobility.  I gave my usual and customary return precautions.      ____________________________________________  FINAL CLINICAL IMPRESSION(S) / ED DIAGNOSES  Final  diagnoses:  Contusion of right lower leg, initial encounter     MEDICATIONS GIVEN DURING THIS VISIT:  Medications - No data to display   ED Discharge Orders    None       Note:  This document was prepared using Dragon voice recognition software and may include unintentional dictation errors.    Hinda Kehr, MD 02/12/18 [id number]

## 2018-02-12 NOTE — Discharge Instructions (Addendum)

## 2018-02-15 ENCOUNTER — Ambulatory Visit (HOSPITAL_COMMUNITY): Payer: Medicaid Other | Admitting: Psychiatry

## 2018-03-09 ENCOUNTER — Ambulatory Visit (HOSPITAL_COMMUNITY): Payer: Medicaid Other | Admitting: Psychiatry

## 2018-04-13 ENCOUNTER — Ambulatory Visit (INDEPENDENT_AMBULATORY_CARE_PROVIDER_SITE_OTHER): Payer: Medicaid Other | Admitting: Family Medicine

## 2018-04-13 ENCOUNTER — Encounter: Payer: Self-pay | Admitting: Family Medicine

## 2018-04-13 VITALS — Temp 98.6°F | Ht 60.0 in | Wt 151.0 lb

## 2018-04-13 DIAGNOSIS — R300 Dysuria: Secondary | ICD-10-CM | POA: Diagnosis not present

## 2018-04-13 DIAGNOSIS — A084 Viral intestinal infection, unspecified: Secondary | ICD-10-CM

## 2018-04-13 DIAGNOSIS — K429 Umbilical hernia without obstruction or gangrene: Secondary | ICD-10-CM | POA: Diagnosis not present

## 2018-04-13 LAB — POCT URINALYSIS DIPSTICK
RBC UA: 250
Spec Grav, UA: >=1.03 — AB (ref 1.010–1.025)
pH, UA: 5 (ref 5.0–8.0)

## 2018-04-13 MED ORDER — ONDANSETRON 8 MG PO TBDP: 8.0000 mg | ORAL_TABLET | Freq: Three times a day (TID) | ORAL | 3 refills | Status: DC | PRN

## 2018-04-13 NOTE — Progress Notes (Signed)
   Subjective:    Patient ID: Lacey Hartman, female    DOB: December 29, 1995, 22 y.o.   MRN: [id number]  Emesis   This is a new problem. Episode onset: 2 days. Associated symptoms comments: diarrhea. Treatments tried: otc cold and cough meds.  Relates nausea vomiting some diarrhea also relates hurts a little when she pees denies high fever chills sweats Painful urination.  Results for orders placed or performed in visit on 04/13/18  POCT urinalysis dipstick  Result Value Ref Range   Color, UA     Clarity, UA     Glucose, UA  Negative   Bilirubin, UA +    Ketones, UA +    Spec Grav, UA >=1.030 (A) 1.010 - 1.025   Blood, UA 250    pH, UA 5.0 5.0 - 8.0   Protein, UA  Negative   Urobilinogen, UA  0.2 or 1.0 E.U./dL   Nitrite, UA +    Leukocytes, UA Trace (A) Negative   Appearance     Odor        Review of Systems  Gastrointestinal: Positive for vomiting.  Dysuria vomiting diarrhea denies high fever chills sweats relates umbilical pain denies headache     Objective:   Physical Exam Lungs clear heart regular HEENT benign small umbilical hernia noted not severe not incarcerated rest of exam is normal       Assessment & Plan:  Small umbilical hernia referral to general surgery doubt that it needs surgery at this point warning signs discussed  Doubt UTI send for urine culture  Viral gastroenteritis Viral gastroenteritis-patient was seen today evaluated.  It is felt to be viral.  Very important to keep hydration doing well.  Warning signs regarding dehydration were discussed.  Nausea medicines as indicated.  Clear liquids bland diet over the next 24 hours with gradual resumption of normal diet.  If bloody stools high fevers intractable vomiting or worse go to ER.  If not improving over the next 24 to 48 hours follow-up here.  Importance of rest discussed.

## 2018-04-15 LAB — URINE CULTURE

## 2018-04-25 ENCOUNTER — Other Ambulatory Visit: Payer: Self-pay | Admitting: Family Medicine

## 2018-04-25 MED ORDER — NITROFURANTOIN MONOHYD MACRO 100 MG PO CAPS: ORAL_CAPSULE | ORAL | 0 refills | Status: DC

## 2018-04-26 ENCOUNTER — Encounter: Payer: Self-pay | Admitting: Family Medicine

## 2018-05-02 ENCOUNTER — Telehealth: Payer: Self-pay

## 2018-05-03 ENCOUNTER — Encounter (HOSPITAL_COMMUNITY): Payer: Self-pay | Admitting: Psychiatry

## 2018-05-03 ENCOUNTER — Ambulatory Visit (INDEPENDENT_AMBULATORY_CARE_PROVIDER_SITE_OTHER): Payer: Medicaid Other | Admitting: Psychiatry

## 2018-05-03 VITALS — BP 118/67 | HR 71 | Resp 16 | Wt 153.0 lb

## 2018-05-03 DIAGNOSIS — F331 Major depressive disorder, recurrent, moderate: Secondary | ICD-10-CM

## 2018-05-03 DIAGNOSIS — F900 Attention-deficit hyperactivity disorder, predominantly inattentive type: Secondary | ICD-10-CM | POA: Diagnosis not present

## 2018-05-03 DIAGNOSIS — Z818 Family history of other mental and behavioral disorders: Secondary | ICD-10-CM | POA: Diagnosis not present

## 2018-05-03 DIAGNOSIS — Z79899 Other long term (current) drug therapy: Secondary | ICD-10-CM

## 2018-05-03 DIAGNOSIS — Z87891 Personal history of nicotine dependence: Secondary | ICD-10-CM

## 2018-05-03 MED ORDER — FLUOXETINE HCL 20 MG PO CAPS
20.0000 mg | ORAL_CAPSULE | Freq: Every day | ORAL | 2 refills | Status: DC
Start: 2018-05-03 — End: 2018-08-03

## 2018-05-03 MED ORDER — METHYLPHENIDATE HCL ER (OSM) 36 MG PO TBCR
36.0000 mg | EXTENDED_RELEASE_TABLET | Freq: Every day | ORAL | 0 refills | Status: DC
Start: 2018-05-03 — End: 2018-08-03

## 2018-05-03 MED ORDER — METHYLPHENIDATE HCL ER (OSM) 36 MG PO TBCR: 36.0000 mg | EXTENDED_RELEASE_TABLET | Freq: Every day | ORAL | 0 refills | Status: DC

## 2018-05-03 NOTE — Progress Notes (Signed)
[place] MD/PA/NP OP Progress Note  05/03/2018 4:01 PM Lacey Hartman  MRN:  [id number]  Chief Complaint:  Chief Complaint    Depression; ADD; Follow-up     HPI: This patient is a 22 year old single white female lives with her boyfriend 2 daughters ages 75 and 45 years and one son age 73 in [place]. 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  The patient was referred by her primary physician, Dr. Sallee Lange, for further assessment and treatment of ADD and depression.  The patient states that she has always struggled in school. She doesn't know much about her history but does know that she was born full-term but spent most of her first year of life in the NICU at [facility]. She is not sure why. She was diagnosed with hearing loss and required speech therapy although way through school. She had trouble learning to read and write and despite being an exceptional children's classes still does not read or write very well. When she was 22 years old in [place] she was very hyperactive and unfocused and distractible. At that time she was diagnosed with ADHD and was on Ritalin for a while and later Adderall. She stopped these medications when she became pregnant at age 74.  The patient states that she used to go to [facility] because of depression. The physician there put her on Prozac last year and she is still taking it although she has not been back to that clinic in quite some time. She states that she still has difficulties with bad temper crying spells anger anxiety and mood swings. At times she gets overwhelmed in caring for the children. Her husband has seizures and is on disability so he is able to be at home and help her most of the time. She also gets anxious and nervous around new people. She denies auditory or visual hallucinations or paranoia. She does not use drugs or alcohol. She is now using an Implanon implant to prevent pregnancy as she has had so many children in such a short amount  of time.  The patient states she is interested in getting back on medication for ADHD. She's no longer hyperactive but very unfocused distractible and on able to finish tasks.  The patient returns after 5 months.  We have missed some appointments somehow.  She is been off her medications for about 2 months.  She is more depressed and tearful has having difficulty sleeping and feels frightened at night.  She is not doing well without the Prozac although she denies being suicidal.  She is not focusing well without the Concerta.  She is still able to take care of her children but looks more disheveled and sad today. Visit Diagnosis:    ICD-10-CM   1. Attention deficit hyperactivity disorder (ADHD), predominantly inattentive type F90.0   2. Moderate episode of recurrent major depressive disorder (HCC) F33.1     Past Psychiatric History: none  Past Medical History:  Past Medical History:  Diagnosis Date  . ADHD (attention deficit hyperactivity disorder)   . Asthma    inhaler prn  . Depression   . Hard of hearing     Past Surgical History:  Procedure Laterality Date  . NO PAST SURGERIES      Family Psychiatric History: See below  Family History:  Family History  Problem Relation Age of Onset  . Depression Mother   . Anxiety disorder Mother   . Bipolar disorder Mother   . Cancer  Maternal Grandmother   . Cancer Maternal Uncle   . ADD / ADHD Brother   . ADD / ADHD Brother     Social History:  Social History   Socioeconomic History  . Marital status: Single    Spouse name: Not on file  . Number of children: Not on file  . Years of education: Not on file  . Highest education level: Not on file  Occupational History  . Not on file  Social Needs  . Financial resource strain: Not on file  . Food insecurity:    Worry: Not on file    Inability: Not on file  . Transportation needs:    Medical: Not on file    Non-medical: Not on file  Tobacco Use  . Smoking status: Former  Smoker    Years: 5.00    Types: Cigarettes  . Smokeless tobacco: Never Used  . Tobacco comment: 06-10-16 per pt she stopped May 2017  Substance and Sexual Activity  . Alcohol use: No    Alcohol/week: 0.0 oz    Comment: 06-10-16 per pt no   . Drug use: No    Comment: 06-10-16 per pt no   . Sexual activity: Yes    Birth control/protection: None, Implant  Lifestyle  . Physical activity:    Days per week: Not on file    Minutes per session: Not on file  . Stress: Not on file  Relationships  . Social connections:    Talks on phone: Not on file    Gets together: Not on file    Attends religious service: Not on file    Active member of club or organization: Not on file    Attends meetings of clubs or organizations: Not on file    Relationship status: Not on file  Other Topics Concern  . Not on file  Social History Narrative  . Not on file    Allergies:  Allergies  Allergen Reactions  . Codeine Hives, Swelling and Rash    & hallucinations/    Metabolic Disorder Labs: No results found for: HGBA1C, MPG No results found for: PROLACTIN Lab Results  Component Value Date   CHOL 139 02/16/2017   TRIG 61 02/16/2017   HDL 42 02/16/2017   CHOLHDL 3.3 02/16/2017   LDLCALC 85 02/16/2017   Lab Results  Component Value Date   TSH 1.700 02/16/2017    Therapeutic Level Labs: No results found for: LITHIUM No results found for: VALPROATE No components found for:  CBMZ  Current Medications: Current Outpatient Medications  Medication Sig Dispense Refill  . FLUoxetine (PROZAC) 20 MG capsule Take 1 capsule (20 mg total) by mouth daily. 90 capsule 2  . methylphenidate (CONCERTA) 36 MG PO CR tablet Take 1 tablet (36 mg total) by mouth daily. 30 tablet 0  . methylphenidate (CONCERTA) 36 MG PO CR tablet Take 1 tablet (36 mg total) by mouth daily. 30 tablet 0  . methylphenidate (CONCERTA) 36 MG PO CR tablet Take 1 tablet (36 mg total) by mouth daily. 30 tablet 0  . nitrofurantoin,  macrocrystal-monohydrate, (MACROBID) 100 MG capsule Take one twice daily for 5 days 10 capsule 0  . ondansetron (ZOFRAN ODT) 8 MG disintegrating tablet Take 1 tablet (8 mg total) by mouth every 8 (eight) hours as needed for nausea or vomiting. 12 tablet 3  . rizatriptan (MAXALT) 5 MG tablet Take 1 tablet (5 mg total) by mouth as needed for migraine. May repeat in 2 hours if needed (Patient  not taking: Reported on 04/13/2018) 8 tablet 2   No current facility-administered medications for this visit.      Musculoskeletal: Strength & Muscle Tone: within normal limits Gait & Station: normal Patient leans: N/A  Psychiatric Specialty Exam: Review of Systems  Psychiatric/Behavioral: Positive for depression. The patient is nervous/anxious.   All other systems reviewed and are negative.   Blood pressure 118/67, pulse 71, resp. rate 16, weight 153 lb (69.4 kg), unknown if currently breastfeeding.Body mass index is 29.88 kg/m.  General Appearance: Casual and Fairly Groomed  Eye Contact:  Good  Speech:  Garbled  Volume:  Decreased  Mood:  Anxious and Depressed  Affect:  Constricted and Tearful  Thought Process:  Goal Directed  Orientation:  Full (Time, Place, and Person)  Thought Content: Rumination   Suicidal Thoughts:  No  Homicidal Thoughts:  No  Memory:  Immediate;   Good Recent;   Fair Remote;   Poor  Judgement:  Impaired  Insight:  Lacking  Psychomotor Activity:  Decreased  Concentration:  Concentration: Poor and Attention Span: Poor  Recall:  [employer] of Knowledge: Fair  Language: Good  Akathisia:  No  Handed:  Right  AIMS (if indicated): not done  Assets:  Communication Skills Desire for Improvement Physical Health Resilience Social Support  ADL's:  Intact  Cognition: Impaired,  Mild  Sleep:  Poor   Screenings: PHQ2-9     Office Visit from 02/02/2017 in [place]  PHQ-2 Total Score  3  PHQ-9 Total Score  8       Assessment and Plan: This  patient is a 22 year old female with a history of ADD and depression.  Unfortunately she is gotten off her medications and now she does not feel well, more depressed anxious and unable to focus.  She will restart Prozac 20 mg daily for depression and Concerta 36 mg daily for ADD.  She will return to see me in 3 months.  She was urged to call if she cannot make appointment so we can at least refill her prescriptions   Levonne Spiller, MD 05/03/2018, 4:01 PM

## 2018-05-11 ENCOUNTER — Ambulatory Visit: Payer: Medicaid Other | Admitting: General Surgery

## 2018-08-03 ENCOUNTER — Encounter (HOSPITAL_COMMUNITY): Payer: Self-pay | Admitting: Psychiatry

## 2018-08-03 ENCOUNTER — Ambulatory Visit (INDEPENDENT_AMBULATORY_CARE_PROVIDER_SITE_OTHER): Payer: Medicaid Other | Admitting: Psychiatry

## 2018-08-03 VITALS — BP 116/70 | HR 65 | Ht 60.0 in | Wt 142.0 lb

## 2018-08-03 DIAGNOSIS — F331 Major depressive disorder, recurrent, moderate: Secondary | ICD-10-CM

## 2018-08-03 DIAGNOSIS — F900 Attention-deficit hyperactivity disorder, predominantly inattentive type: Secondary | ICD-10-CM

## 2018-08-03 MED ORDER — METHYLPHENIDATE HCL ER (OSM) 36 MG PO TBCR: 36.0000 mg | EXTENDED_RELEASE_TABLET | Freq: Every day | ORAL | 0 refills | Status: DC

## 2018-08-03 MED ORDER — FLUOXETINE HCL 20 MG PO CAPS: 20.0000 mg | ORAL_CAPSULE | Freq: Every day | ORAL | 2 refills | Status: DC

## 2018-08-03 NOTE — Progress Notes (Signed)
[place] MD/PA/NP OP Progress Note  08/03/2018 4:01 PM Lacey Hartman  MRN:  [id number]  Chief Complaint:  Chief Complaint    ADD; Depression; Follow-up     HPI: This patient is a 22 year old single white female lives with her boyfriend 2 daughters ages5and 3years and one son age 13in 94. 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  The patient was referred by her primary physician, Dr. Sallee Lange, for further assessment and treatment of ADD and depression.  The patient states that she has always struggled in school. She doesn't know much about her history but does know that she was born full-term but spent most of her first year of life in the NICU at [facility]. She is not sure why. She was diagnosed with hearing loss and required speech therapy although way through school. She had trouble learning to read and write and despite being an exceptional children's classes still does not read or write very well. When she was 73 years old in [place] she was very hyperactive and unfocused and distractible. At that time she was diagnosed with ADHD and was on Ritalin for a while and later Adderall. She stopped these medications when she became pregnant at age 39.  The patient states that she used to go to [facility] because of depression. The physician there put her on Prozac last year and she is still taking it although she has not been back to that clinic in quite some time. She states that she still has difficulties with bad temper crying spells anger anxiety and mood swings. At times she gets overwhelmed in caring for the children. Her husband has seizures and is on disability so he is able to be at home and help her most of the time. She also gets anxious and nervous around new people. She denies auditory or visual hallucinations or paranoia. She does not use drugs or alcohol. She is now using an Implanon implant to prevent pregnancy as she has had so many children in such a short amount  of time.  The patient states she is interested in getting back on medication for ADHD. She's no longer hyperactive but very unfocused distractible and on able to finish tasks.  The patient returns after 3 months.  She states that she is doing very well.  Her mood has been stable and she denies being depressed.  She was able to get her driver's license and now has more independence.  She likes to participate in her children school activities and field trips.  She is sleeping well at night.  The Concerta continues to help her focus. Visit Diagnosis:    ICD-10-CM   1. Attention deficit hyperactivity disorder (ADHD), predominantly inattentive type F90.0   2. Moderate episode of recurrent major depressive disorder (HCC) F33.1     Past Psychiatric History: none  Past Medical History:  Past Medical History:  Diagnosis Date  . ADHD (attention deficit hyperactivity disorder)   . Asthma    inhaler prn  . Depression   . Hard of hearing     Past Surgical History:  Procedure Laterality Date  . NO PAST SURGERIES      Family Psychiatric History: See below  Family History:  Family History  Problem Relation Age of Onset  . Depression Mother   . Anxiety disorder Mother   . Bipolar disorder Mother   . Cancer Maternal Grandmother   . Cancer Maternal Uncle   . ADD / ADHD Brother   .  ADD / ADHD Brother     Social History:  Social History   Socioeconomic History  . Marital status: Single    Spouse name: Not on file  . Number of children: Not on file  . Years of education: Not on file  . Highest education level: Not on file  Occupational History  . Not on file  Social Needs  . Financial resource strain: Not on file  . Food insecurity:    Worry: Not on file    Inability: Not on file  . Transportation needs:    Medical: Not on file    Non-medical: Not on file  Tobacco Use  . Smoking status: Former Smoker    Years: 5.00    Types: Cigarettes  . Smokeless tobacco: Never Used  .  Tobacco comment: 06-10-16 per pt she stopped May 2017  Substance and Sexual Activity  . Alcohol use: No    Alcohol/week: 0.0 standard drinks    Comment: 06-10-16 per pt no   . Drug use: No    Comment: 06-10-16 per pt no   . Sexual activity: Yes    Birth control/protection: None, Implant  Lifestyle  . Physical activity:    Days per week: Not on file    Minutes per session: Not on file  . Stress: Not on file  Relationships  . Social connections:    Talks on phone: Not on file    Gets together: Not on file    Attends religious service: Not on file    Active member of club or organization: Not on file    Attends meetings of clubs or organizations: Not on file    Relationship status: Not on file  Other Topics Concern  . Not on file  Social History Narrative  . Not on file    Allergies:  Allergies  Allergen Reactions  . Codeine Hives, Swelling and Rash    & hallucinations/    Metabolic Disorder Labs: No results found for: HGBA1C, MPG No results found for: PROLACTIN Lab Results  Component Value Date   CHOL 139 02/16/2017   TRIG 61 02/16/2017   HDL 42 02/16/2017   CHOLHDL 3.3 02/16/2017   LDLCALC 85 02/16/2017   Lab Results  Component Value Date   TSH 1.700 02/16/2017    Therapeutic Level Labs: No results found for: LITHIUM No results found for: VALPROATE No components found for:  CBMZ  Current Medications: Current Outpatient Medications  Medication Sig Dispense Refill  . FLUoxetine (PROZAC) 20 MG capsule Take 1 capsule (20 mg total) by mouth daily. 90 capsule 2  . methylphenidate (CONCERTA) 36 MG PO CR tablet Take 1 tablet (36 mg total) by mouth daily. 30 tablet 0  . methylphenidate (CONCERTA) 36 MG PO CR tablet Take 1 tablet (36 mg total) by mouth daily. 30 tablet 0  . methylphenidate (CONCERTA) 36 MG PO CR tablet Take 1 tablet (36 mg total) by mouth daily. 30 tablet 0  . nitrofurantoin, macrocrystal-monohydrate, (MACROBID) 100 MG capsule Take one twice daily for  5 days 10 capsule 0  . ondansetron (ZOFRAN ODT) 8 MG disintegrating tablet Take 1 tablet (8 mg total) by mouth every 8 (eight) hours as needed for nausea or vomiting. 12 tablet 3  . rizatriptan (MAXALT) 5 MG tablet Take 1 tablet (5 mg total) by mouth as needed for migraine. May repeat in 2 hours if needed 8 tablet 2   No current facility-administered medications for this visit.      Musculoskeletal:  Strength & Muscle Tone: within normal limits Gait & Station: normal Patient leans: N/A  Psychiatric Specialty Exam: Review of Systems  All other systems reviewed and are negative.   Blood pressure 116/70, pulse 65, height 5' (1.524 m), weight 142 lb (64.4 kg), SpO2 100 %, unknown if currently breastfeeding.Body mass index is 27.73 kg/m.  General Appearance: Casual, Disheveled and Fairly Groomed  Eye Contact:  Good  Speech:  Clear and Coherent  Volume:  Normal  Mood:  Euthymic  Affect:  Congruent  Thought Process:  Goal Directed  Orientation:  Full (Time, Place, and Person)  Thought Content: WDL   Suicidal Thoughts:  No  Homicidal Thoughts:  No  Memory:  Immediate;   Good Recent;   Good Remote;   Fair  Judgement:  Fair  Insight:  Shallow  Psychomotor Activity:  Normal  Concentration:  Concentration: Good and Attention Span: Good  Recall:  Good  Fund of Knowledge: Fair  Language: Fair  Akathisia:  No  Handed:  Right  AIMS (if indicated): not done  Assets:  Communication Skills Desire for Improvement Physical Health Resilience Social Support Talents/Skills  ADL's:  Intact  Cognition: Impaired,  Mild  Sleep:  Good   Screenings: PHQ2-9     Office Visit from 02/02/2017 in [facility]  PHQ-2 Total Score  3  PHQ-9 Total Score  8       Assessment and Plan:  This patient is a 22 year old female with a history of mild intellectual impairment speech delays and ADD.  Her focus is much better on Concerta 36 mg every morning.  She also has a history of  depression which is responded well to Prozac 20 mg daily.  These medications will be continued and she will return to see me in 3 months  Levonne Spiller, MD 08/03/2018, 4:01 PM

## 2018-08-10 ENCOUNTER — Ambulatory Visit: Payer: Medicaid Other | Admitting: Family Medicine

## 2018-08-10 VITALS — Temp 98.2°F | Ht 60.0 in | Wt 147.6 lb

## 2018-08-10 DIAGNOSIS — H109 Unspecified conjunctivitis: Secondary | ICD-10-CM

## 2018-08-10 DIAGNOSIS — H65111 Acute and subacute allergic otitis media (mucoid) (sanguinous) (serous), right ear: Secondary | ICD-10-CM

## 2018-08-10 DIAGNOSIS — B309 Viral conjunctivitis, unspecified: Secondary | ICD-10-CM

## 2018-08-10 HISTORY — DX: Unspecified conjunctivitis: H10.9

## 2018-08-10 MED ORDER — SULFACETAMIDE SODIUM 10 % OP SOLN: 2.0000 [drp] | Freq: Four times a day (QID) | OPHTHALMIC | 0 refills | Status: AC

## 2018-08-10 MED ORDER — AMOXICILLIN 500 MG PO TABS: 500.0000 mg | ORAL_TABLET | Freq: Three times a day (TID) | ORAL | 0 refills | Status: DC

## 2018-08-10 NOTE — Progress Notes (Signed)
   Subjective:    Patient ID: Lacey Hartman, female    DOB: 12/04/1995, 22 y.o.   MRN: [id number]  Cough  This is a new problem. The current episode started in the past 7 days. Associated symptoms include a fever, nasal congestion, rhinorrhea and a sore throat. Pertinent negatives include no chest pain, ear pain, shortness of breath or wheezing. She has tried nothing for the symptoms.  Patient with head congestion drainage coughing sinus pressure denies high fever chills sweats denies vomiting diarrhea    Review of Systems  Constitutional: Positive for fever. Negative for activity change.  HENT: Positive for congestion, rhinorrhea and sore throat. Negative for ear pain.   Eyes: Negative for discharge.  Respiratory: Positive for cough. Negative for shortness of breath and wheezing.   Cardiovascular: Negative for chest pain.       Objective:   Physical Exam  Constitutional: She appears well-developed.  HENT:  Head: Normocephalic.  Nose: Nose normal.  Mouth/Throat: Oropharynx is clear and moist. No oropharyngeal exudate.  Neck: Neck supple.  Cardiovascular: Normal rate and normal heart sounds.  No murmur heard. Pulmonary/Chest: Effort normal and breath sounds normal. She has no wheezes.  Lymphadenopathy:    She has no cervical adenopathy.  Skin: Skin is warm and dry.  Nursing note and vitals reviewed.  Right otitis media noted also right side conjunctivitis       Assessment & Plan:  Patient was seen today for upper respiratory illness. It is felt that the patient is dealing with sinusitis.  Antibiotics were prescribed today. Importance of compliance with medication was discussed.  Symptoms should gradually resolve over the course of the next several days. If high fevers, progressive illness, difficulty breathing, worsening condition or failure for symptoms to improve over the next several days then the patient is to follow-up.  If any emergent conditions the patient is to  follow-up in the emergency department otherwise to follow-up in the office.

## 2018-11-06 ENCOUNTER — Encounter (HOSPITAL_COMMUNITY): Payer: Self-pay | Admitting: Psychiatry

## 2018-11-06 ENCOUNTER — Ambulatory Visit (INDEPENDENT_AMBULATORY_CARE_PROVIDER_SITE_OTHER): Payer: Medicaid Other | Admitting: Psychiatry

## 2018-11-06 VITALS — BP 119/72 | HR 84 | Ht 60.0 in | Wt 148.0 lb

## 2018-11-06 DIAGNOSIS — F331 Major depressive disorder, recurrent, moderate: Secondary | ICD-10-CM

## 2018-11-06 DIAGNOSIS — F900 Attention-deficit hyperactivity disorder, predominantly inattentive type: Secondary | ICD-10-CM | POA: Diagnosis not present

## 2018-11-06 MED ORDER — METHYLPHENIDATE HCL ER (OSM) 36 MG PO TBCR
36.0000 mg | EXTENDED_RELEASE_TABLET | Freq: Every day | ORAL | 0 refills | Status: DC
Start: 2018-11-06 — End: 2019-02-06

## 2018-11-06 MED ORDER — METHYLPHENIDATE HCL ER (OSM) 36 MG PO TBCR: 36.0000 mg | EXTENDED_RELEASE_TABLET | Freq: Every day | ORAL | 0 refills | Status: DC

## 2018-11-06 MED ORDER — FLUOXETINE HCL 20 MG PO CAPS
20.0000 mg | ORAL_CAPSULE | Freq: Every day | ORAL | 2 refills | Status: DC
Start: 2018-11-06 — End: 2019-02-06

## 2018-11-06 NOTE — Progress Notes (Signed)
[place] MD/PA/NP OP Progress Note  11/06/2018 4:20 PM RANETTA ARMACOST  MRN:  [id number]  Chief Complaint:  Chief Complaint    ADHD; Depression; Follow-up     HPI: This patient is a 23 year old single white female lives with her boyfriend 2 daughters ages5and 3years and one son age 62in 27. 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  The patient was referred by her primary physician, Dr. Sallee Lange, for further assessment and treatment of ADD and depression.  The patient states that she has always struggled in school. She doesn't know much about her history but does know that she was born full-term but spent most of her first year of life in the NICU at [facility]. She is not sure why. She was diagnosed with hearing loss and required speech therapy although way through school. She had trouble learning to read and write and despite being an exceptional children's classes still does not read or write very well. When she was 23 years old in [place] she was very hyperactive and unfocused and distractible. At that time she was diagnosed with ADHD and was on Ritalin for a while and later Adderall. She stopped these medications when she became pregnant at age 89.  The patient states that she used to go to [facility] because of depression. The physician there put her on Prozac last year and she is still taking it although she has not been back to that clinic in quite some time. She states that she still has difficulties with bad temper crying spells anger anxiety and mood swings. At times she gets overwhelmed in caring for the children. Her husband has seizures and is on disability so he is able to be at home and help her most of the time. She also gets anxious and nervous around new people. She denies auditory or visual hallucinations or paranoia. She does not use drugs or alcohol. She is now using an Implanon implant to prevent pregnancy as she has had so many children in such a short amount  of time.  The patient states she is interested in getting back on medication for ADHD. She's no longer hyperactive but very unfocused distractible and on able to finish tasks.  The patient returns after 3 months.  For the most part she is doing well.  However she states her boyfriend has been more than happy with the relationship.  She claims he is getting more controlling specially since she started driving on her own.  Has been accusing her of having affairs with his own brother which is not true at all.  She is helping he will wake up and see the truth.  However she is focusing well and sleeping well at night and she denies any symptoms of depression or anxiety. Visit Diagnosis:    ICD-10-CM   1. Attention deficit hyperactivity disorder (ADHD), predominantly inattentive type F90.0   2. Moderate episode of recurrent major depressive disorder (HCC) F33.1     Past Psychiatric History: none  Past Medical History:  Past Medical History:  Diagnosis Date  . ADHD (attention deficit hyperactivity disorder)   . Asthma    inhaler prn  . Depression   . Hard of hearing     Past Surgical History:  Procedure Laterality Date  . NO PAST SURGERIES      Family Psychiatric History: See below  Family History:  Family History  Problem Relation Age of Onset  . Depression Mother   . Anxiety disorder Mother   .  Bipolar disorder Mother   . Cancer Maternal Grandmother   . Cancer Maternal Uncle   . ADD / ADHD Brother   . ADD / ADHD Brother     Social History:  Social History   Socioeconomic History  . Marital status: Single    Spouse name: Not on file  . Number of children: Not on file  . Years of education: Not on file  . Highest education level: Not on file  Occupational History  . Not on file  Social Needs  . Financial resource strain: Not on file  . Food insecurity:    Worry: Not on file    Inability: Not on file  . Transportation needs:    Medical: Not on file    Non-medical:  Not on file  Tobacco Use  . Smoking status: Former Smoker    Years: 5.00    Types: Cigarettes  . Smokeless tobacco: Never Used  . Tobacco comment: 06-10-16 per pt she stopped May 2017  Substance and Sexual Activity  . Alcohol use: No    Alcohol/week: 0.0 standard drinks    Comment: 06-10-16 per pt no   . Drug use: No    Comment: 06-10-16 per pt no   . Sexual activity: Yes    Birth control/protection: None, Implant  Lifestyle  . Physical activity:    Days per week: Not on file    Minutes per session: Not on file  . Stress: Not on file  Relationships  . Social connections:    Talks on phone: Not on file    Gets together: Not on file    Attends religious service: Not on file    Active member of club or organization: Not on file    Attends meetings of clubs or organizations: Not on file    Relationship status: Not on file  Other Topics Concern  . Not on file  Social History Narrative  . Not on file    Allergies:  Allergies  Allergen Reactions  . Codeine Hives, Swelling and Rash    & hallucinations/    Metabolic Disorder Labs: No results found for: HGBA1C, MPG No results found for: PROLACTIN Lab Results  Component Value Date   CHOL 139 02/16/2017   TRIG 61 02/16/2017   HDL 42 02/16/2017   CHOLHDL 3.3 02/16/2017   LDLCALC 85 02/16/2017   Lab Results  Component Value Date   TSH 1.700 02/16/2017    Therapeutic Level Labs: No results found for: LITHIUM No results found for: VALPROATE No components found for:  CBMZ  Current Medications: Current Outpatient Medications  Medication Sig Dispense Refill  . amoxicillin (AMOXIL) 500 MG tablet Take 1 tablet (500 mg total) by mouth 3 (three) times daily. 30 tablet 0  . FLUoxetine (PROZAC) 20 MG capsule Take 1 capsule (20 mg total) by mouth daily. 90 capsule 2  . methylphenidate (CONCERTA) 36 MG PO CR tablet Take 1 tablet (36 mg total) by mouth daily. 30 tablet 0  . methylphenidate (CONCERTA) 36 MG PO CR tablet Take 1  tablet (36 mg total) by mouth daily. 30 tablet 0  . methylphenidate (CONCERTA) 36 MG PO CR tablet Take 1 tablet (36 mg total) by mouth daily. 30 tablet 0  . nitrofurantoin, macrocrystal-monohydrate, (MACROBID) 100 MG capsule Take one twice daily for 5 days 10 capsule 0  . ondansetron (ZOFRAN ODT) 8 MG disintegrating tablet Take 1 tablet (8 mg total) by mouth every 8 (eight) hours as needed for nausea or vomiting.  12 tablet 3  . rizatriptan (MAXALT) 5 MG tablet Take 1 tablet (5 mg total) by mouth as needed for migraine. May repeat in 2 hours if needed 8 tablet 2   No current facility-administered medications for this visit.      Musculoskeletal: Strength & Muscle Tone: within normal limits Gait & Station: normal Patient leans: N/A  Psychiatric Specialty Exam: Review of Systems  Gastrointestinal: Positive for heartburn.  All other systems reviewed and are negative.   Blood pressure 119/72, pulse 84, height 5' (1.524 m), weight 148 lb (67.1 kg), SpO2 98 %, unknown if currently breastfeeding.Body mass index is 28.9 kg/m.  General Appearance: Casual and Fairly Groomed  Eye Contact:  Good  Speech:  Clear and Coherent  Volume:  Normal  Mood:  Euthymic  Affect:  Congruent  Thought Process:  Goal Directed  Orientation:  Full (Time, Place, and Person)  Thought Content: WDL   Suicidal Thoughts:  No  Homicidal Thoughts:  No  Memory:  Immediate;   Good Recent;   Good Remote;   Fair  Judgement:  Fair  Insight:  Fair  Psychomotor Activity:  Normal  Concentration:  Concentration: Fair and Attention Span: Fair  Recall:  Good  Fund of Knowledge: Fair  Language: Good  Akathisia:  No  Handed:  Right  AIMS (if indicated): not done  Assets:  Communication Skills Desire for Improvement Physical Health Resilience Social Support  ADL's:  Intact  Cognition: Impaired,  Mild  Sleep:  Good   Screenings: PHQ2-9     Office Visit from 02/02/2017 in [facility]  PHQ-2 Total  Score  3  PHQ-9 Total Score  8       Assessment and Plan: This patient is a 23 year old female with a history of depression and ADHD.  She continues to do fairly well despite her relationship issues.  She will continue Prozac 20 mg daily for depression and Concerta 36 mg daily for ADHD.  She will return to see me in 3 months   Levonne Spiller, MD 11/06/2018, 4:20 PM

## 2019-02-06 ENCOUNTER — Ambulatory Visit (INDEPENDENT_AMBULATORY_CARE_PROVIDER_SITE_OTHER): Payer: Medicaid Other | Admitting: Psychiatry

## 2019-02-06 ENCOUNTER — Encounter (HOSPITAL_COMMUNITY): Payer: Self-pay | Admitting: Psychiatry

## 2019-02-06 ENCOUNTER — Other Ambulatory Visit: Payer: Self-pay

## 2019-02-06 DIAGNOSIS — F900 Attention-deficit hyperactivity disorder, predominantly inattentive type: Secondary | ICD-10-CM

## 2019-02-06 DIAGNOSIS — F320 Major depressive disorder, single episode, mild: Secondary | ICD-10-CM | POA: Diagnosis not present

## 2019-02-06 MED ORDER — METHYLPHENIDATE HCL ER (OSM) 36 MG PO TBCR
36.0000 mg | EXTENDED_RELEASE_TABLET | Freq: Every day | ORAL | 0 refills | Status: DC
Start: 2019-02-06 — End: 2019-05-14

## 2019-02-06 MED ORDER — METHYLPHENIDATE HCL ER (OSM) 36 MG PO TBCR: 36.0000 mg | EXTENDED_RELEASE_TABLET | Freq: Every day | ORAL | 0 refills | Status: DC

## 2019-02-06 MED ORDER — FLUOXETINE HCL 20 MG PO CAPS: 20.0000 mg | ORAL_CAPSULE | Freq: Every day | ORAL | 2 refills | Status: DC

## 2019-02-06 NOTE — Progress Notes (Signed)
BH MD/PA/NP OP Progress Note  02/06/2019 4:14 PM Lacey Hartman  MRN:  [id number]  Chief Complaint:  Chief Complaint    Depression; ADHD; Follow-up     Virtual Visit via Video Note  I connected with Lacey Hartman on 02/06/19 at  4:00 PM EDT by a video enabled telemedicine application and verified that I am speaking with the correct person using two identifiers.   I discussed the limitations of evaluation and management by telemedicine and the availability of in person appointments. The patient expressed understanding and agreed to proceed.       I discussed the assessment and treatment plan with the patient. The patient was provided an opportunity to ask questions and all were answered. The patient agreed with the plan and demonstrated an understanding of the instructions.   The patient was advised to call back or seek an in-person evaluation if the symptoms worsen or if the condition fails to improve as anticipated.  I provided 15 minutes of non-face-to-face time during this encounter.   Levonne Spiller, MD This patient is a 23 year old single white female lives with her boyfriend 2 daughters ages5and 3years and one son age 32in 19. 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  The patient was referred by her primary physician, Dr. Sallee Lange, for further assessment and treatment of ADD and depression.  The patient states that she has always struggled in school. She doesn't know much about her history but does know that she was born full-term but spent most of her first year of life in the NICU at [facility]. She is not sure why. She was diagnosed with hearing loss and required speech therapy although way through school. She had trouble learning to read and write and despite being an exceptional children's classes still does not read or write very well. When she was 23 years old in [place] she was very hyperactive and unfocused and distractible. At that time she  was diagnosed with ADHD and was on Ritalin for a while and later Adderall. She stopped these medications when she became pregnant at age 23.  The patient states that she used to go to [facility] because of depression. The physician there put her on Prozac last year and she is still taking it although she has not been back to that clinic in quite some time. She states that she still has difficulties with bad temper crying spells anger anxiety and mood swings. At times she gets overwhelmed in caring for the children. Her husband has seizures and is on disability so he is able to be at home and help her most of the time. She also gets anxious and nervous around new people. She denies auditory or visual hallucinations or paranoia. She does not use drugs or alcohol. She is now using an Implanon implant to prevent pregnancy as she has had so many children in such a short amount of time.  The patient states she is interested in getting back on medication for ADHD. She's no longer hyperactive but very unfocused distractible and on able to finish tasks.  The patient returns for follow-up after 3 months.  She states that she is generally doing well.  Her mood has improved because her boyfriend is laid off during the coronavirus pandemic and that there is spending more time together and he is less angry and irritable.  She is sleeping well at night and is staying focused through the day.  She is trying to help her  8 and 43-year-old do their assignments from school and so far it is going well.  She does not have any specific complaints.   Visit Diagnosis:    ICD-10-CM   1. Attention deficit hyperactivity disorder (ADHD), predominantly inattentive type F90.0   2. Current mild episode of major depressive disorder without prior episode ([place]) F32.0     Past Psychiatric History:none  Past Medical History:  Past Medical History:  Diagnosis Date  . ADHD (attention deficit hyperactivity disorder)   . Asthma     inhaler prn  . Depression   . Hard of hearing     Past Surgical History:  Procedure Laterality Date  . NO PAST SURGERIES      Family Psychiatric History: see below  Family History:  Family History  Problem Relation Age of Onset  . Depression Mother   . Anxiety disorder Mother   . Bipolar disorder Mother   . Cancer Maternal Grandmother   . Cancer Maternal Uncle   . ADD / ADHD Brother   . ADD / ADHD Brother     Social History:  Social History   Socioeconomic History  . Marital status: Single    Spouse name: Not on file  . Number of children: Not on file  . Years of education: Not on file  . Highest education level: Not on file  Occupational History  . Not on file  Social Needs  . Financial resource strain: Not on file  . Food insecurity:    Worry: Not on file    Inability: Not on file  . Transportation needs:    Medical: Not on file    Non-medical: Not on file  Tobacco Use  . Smoking status: Former Smoker    Years: 5.00    Types: Cigarettes  . Smokeless tobacco: Never Used  . Tobacco comment: 06-10-16 per pt she stopped May 2017  Substance and Sexual Activity  . Alcohol use: No    Alcohol/week: 0.0 standard drinks    Comment: 06-10-16 per pt no   . Drug use: No    Comment: 06-10-16 per pt no   . Sexual activity: Yes    Birth control/protection: None, Implant  Lifestyle  . Physical activity:    Days per week: Not on file    Minutes per session: Not on file  . Stress: Not on file  Relationships  . Social connections:    Talks on phone: Not on file    Gets together: Not on file    Attends religious service: Not on file    Active member of club or organization: Not on file    Attends meetings of clubs or organizations: Not on file    Relationship status: Not on file  Other Topics Concern  . Not on file  Social History Narrative  . Not on file    Allergies:  Allergies  Allergen Reactions  . Codeine Hives, Swelling and Rash    & hallucinations/     Metabolic Disorder Labs: No results found for: HGBA1C, MPG No results found for: PROLACTIN Lab Results  Component Value Date   CHOL 139 02/16/2017   TRIG 61 02/16/2017   HDL 42 02/16/2017   CHOLHDL 3.3 02/16/2017   LDLCALC 85 02/16/2017   Lab Results  Component Value Date   TSH 1.700 02/16/2017    Therapeutic Level Labs: No results found for: LITHIUM No results found for: VALPROATE No components found for:  CBMZ  Current Medications: Current Outpatient Medications  Medication  Sig Dispense Refill  . amoxicillin (AMOXIL) 500 MG tablet Take 1 tablet (500 mg total) by mouth 3 (three) times daily. 30 tablet 0  . FLUoxetine (PROZAC) 20 MG capsule Take 1 capsule (20 mg total) by mouth daily. 90 capsule 2  . methylphenidate (CONCERTA) 36 MG PO CR tablet Take 1 tablet (36 mg total) by mouth daily. 30 tablet 0  . methylphenidate (CONCERTA) 36 MG PO CR tablet Take 1 tablet (36 mg total) by mouth daily. 30 tablet 0  . methylphenidate (CONCERTA) 36 MG PO CR tablet Take 1 tablet (36 mg total) by mouth daily. 30 tablet 0  . nitrofurantoin, macrocrystal-monohydrate, (MACROBID) 100 MG capsule Take one twice daily for 5 days 10 capsule 0  . ondansetron (ZOFRAN ODT) 8 MG disintegrating tablet Take 1 tablet (8 mg total) by mouth every 8 (eight) hours as needed for nausea or vomiting. 12 tablet 3  . rizatriptan (MAXALT) 5 MG tablet Take 1 tablet (5 mg total) by mouth as needed for migraine. May repeat in 2 hours if needed 8 tablet 2   No current facility-administered medications for this visit.      Musculoskeletal: Strength & Muscle Tone: normal Gait & Station: normal Patient leans: N/A  Psychiatric Specialty Exam: Review of Systems  All other systems reviewed and are negative.   unknown if currently breastfeeding.There is no height or weight on file to calculate BMI.  General Appearance: Casual and Fairly Groomed  Eye Contact:  Good  Speech:  Clear and Coherent  Volume:  Normal   Mood:  Euthymic  Affect:  Congruent  Thought Process:  Goal Directed  Orientation:  Full (Time, Place, and Person)  Thought Content: WDL   Suicidal Thoughts:  No  Homicidal Thoughts:  No  Memory:  Immediate;   Good Recent;   Good Remote;   Fair  Judgement:  Good  Insight:  Fair  Psychomotor Activity:  Normal  Concentration:  Concentration: Good and Attention Span: Good  Recall:  Good  Fund of Knowledge: Fair  Language: Good  Akathisia:  No  Handed:  Right  AIMS (if indicated): not done  Assets:  Communication Skills Desire for Improvement Physical Health Resilience Social Support Talents/Skills  ADL's:  Intact  Cognition: Impaired,  Mild  Sleep:  Good   Screenings: PHQ2-9     Office Visit from 02/02/2017 in [place]  PHQ-2 Total Score  3  PHQ-9 Total Score  8       Assessment and Plan: This patient is a 23 year old female with a history of depression and ADHD.  She continues to do well on her current regimen.  She will continue Prozac 20 mg daily for depression and Concerta 36 mg every morning for focus.  She will return to see me in 3 months   Levonne Spiller, MD 02/06/2019, 4:14 PM

## 2019-03-28 DIAGNOSIS — Z30017 Encounter for initial prescription of implantable subdermal contraceptive: Secondary | ICD-10-CM | POA: Diagnosis not present

## 2019-04-30 DIAGNOSIS — Z3046 Encounter for surveillance of implantable subdermal contraceptive: Secondary | ICD-10-CM | POA: Diagnosis not present

## 2019-05-14 ENCOUNTER — Ambulatory Visit (INDEPENDENT_AMBULATORY_CARE_PROVIDER_SITE_OTHER): Payer: Medicaid Other | Admitting: Psychiatry

## 2019-05-14 ENCOUNTER — Other Ambulatory Visit: Payer: Self-pay

## 2019-05-14 ENCOUNTER — Encounter (HOSPITAL_COMMUNITY): Payer: Self-pay | Admitting: Psychiatry

## 2019-05-14 DIAGNOSIS — F331 Major depressive disorder, recurrent, moderate: Secondary | ICD-10-CM

## 2019-05-14 DIAGNOSIS — F900 Attention-deficit hyperactivity disorder, predominantly inattentive type: Secondary | ICD-10-CM

## 2019-05-14 MED ORDER — METHYLPHENIDATE HCL ER (OSM) 36 MG PO TBCR: 36.0000 mg | EXTENDED_RELEASE_TABLET | Freq: Every day | ORAL | 0 refills | Status: DC

## 2019-05-14 MED ORDER — FLUOXETINE HCL 20 MG PO CAPS: 20.0000 mg | ORAL_CAPSULE | Freq: Every day | ORAL | 2 refills | Status: DC

## 2019-05-14 NOTE — Progress Notes (Signed)
Virtual Visit via Video Note  I connected with Lacey Hartman on 05/14/19 at  2:00 PM EDT by a video enabled telemedicine application and verified that I am speaking with the correct person using two identifiers.   I discussed the limitations of evaluation and management by telemedicine and the availability of in person appointments. The patient expressed understanding and agreed to proceed.     I discussed the assessment and treatment plan with the patient. The patient was provided an opportunity to ask questions and all were answered. The patient agreed with the plan and demonstrated an understanding of the instructions.   The patient was advised to call back or seek an in-person evaluation if the symptoms worsen or if the condition fails to improve as anticipated.  I provided 15 minutes of non-face-to-face time during this encounter.   Levonne Spiller, MD  [facility] MD/PA/NP OP Progress Note  05/14/2019 2:16 PM Lacey Hartman  MRN:  [id number]  Chief Complaint:  Chief Complaint    Depression; ADD; Follow-up     HPI: This patient is a 24 year old single white female lives with her boyfriend 2 daughters ages5and 3years and one son age 26in 44. 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  The patient was referred by her primary physician, Dr. Sallee Lange, for further assessment and treatment of ADD and depression.  The patient states that she has always struggled in school. She doesn't know much about her history but does know that she was born full-term but spent most of her first year of life in the NICU at [facility]. She is not sure why. She was diagnosed with hearing loss and required speech therapy although way through school. She had trouble learning to read and write and despite being an exceptional children's classes still does not read or write very well. When she was 23 years old in [place] she was very hyperactive and unfocused and distractible. At that time she  was diagnosed with ADHD and was on Ritalin for a while and later Adderall. She stopped these medications when she became pregnant at age 46.  The patient states that she used to go to [facility] because of depression. The physician there put her on Prozac last year and she is still taking it although she has not been back to that clinic in quite some time. She states that she still has difficulties with bad temper crying spells anger anxiety and mood swings. At times she gets overwhelmed in caring for the children. Her husband has seizures and is on disability so he is able to be at home and help her most of the time. She also gets anxious and nervous around new people. She denies auditory or visual hallucinations or paranoia. She does not use drugs or alcohol. She is now using an Implanon implant to prevent pregnancy as she has had so many children in such a short amount of time.  The patient states she is interested in getting back on medication for ADHD. She's no longer hyperactive but very unfocused distractible and on able to finish tasks  The patient returns for follow-up after 3 months.  She states that she is doing quite well.  Her children are all at home and she is trying to keep them busy and entertained.  Her mood has been good and she denies any difficulties with focus.  She seems bright cheerful and upbeat.  She is sleeping well. Visit Diagnosis:    ICD-10-CM   1.  Attention deficit hyperactivity disorder (ADHD), predominantly inattentive type  F90.0   2. Moderate episode of recurrent major depressive disorder (HCC)  F33.1     Past Psychiatric History: none  Past Medical History:  Past Medical History:  Diagnosis Date  . ADHD (attention deficit hyperactivity disorder)   . Asthma    inhaler prn  . Depression   . Hard of hearing     Past Surgical History:  Procedure Laterality Date  . NO PAST SURGERIES      Family Psychiatric History: see below  Family History:  Family  History  Problem Relation Age of Onset  . Depression Mother   . Anxiety disorder Mother   . Bipolar disorder Mother   . Cancer Maternal Grandmother   . Cancer Maternal Uncle   . ADD / ADHD Brother   . ADD / ADHD Brother     Social History:  Social History   Socioeconomic History  . Marital status: Single    Spouse name: Not on file  . Number of children: Not on file  . Years of education: Not on file  . Highest education level: Not on file  Occupational History  . Not on file  Social Needs  . Financial resource strain: Not on file  . Food insecurity    Worry: Not on file    Inability: Not on file  . Transportation needs    Medical: Not on file    Non-medical: Not on file  Tobacco Use  . Smoking status: Former Smoker    Years: 5.00    Types: Cigarettes  . Smokeless tobacco: Never Used  . Tobacco comment: 06-10-16 per pt she stopped May 2017  Substance and Sexual Activity  . Alcohol use: No    Alcohol/week: 0.0 standard drinks    Comment: 06-10-16 per pt no   . Drug use: No    Comment: 06-10-16 per pt no   . Sexual activity: Yes    Birth control/protection: None, Implant  Lifestyle  . Physical activity    Days per week: Not on file    Minutes per session: Not on file  . Stress: Not on file  Relationships  . Social Herbalist on phone: Not on file    Gets together: Not on file    Attends religious service: Not on file    Active member of club or organization: Not on file    Attends meetings of clubs or organizations: Not on file    Relationship status: Not on file  Other Topics Concern  . Not on file  Social History Narrative  . Not on file    Allergies:  Allergies  Allergen Reactions  . Codeine Hives, Swelling and Rash    & hallucinations/    Metabolic Disorder Labs: No results found for: HGBA1C, MPG No results found for: PROLACTIN Lab Results  Component Value Date   CHOL 139 02/16/2017   TRIG 61 02/16/2017   HDL 42 02/16/2017    CHOLHDL 3.3 02/16/2017   LDLCALC 85 02/16/2017   Lab Results  Component Value Date   TSH 1.700 02/16/2017    Therapeutic Level Labs: No results found for: LITHIUM No results found for: VALPROATE No components found for:  CBMZ  Current Medications: Current Outpatient Medications  Medication Sig Dispense Refill  . amoxicillin (AMOXIL) 500 MG tablet Take 1 tablet (500 mg total) by mouth 3 (three) times daily. 30 tablet 0  . FLUoxetine (PROZAC) 20 MG capsule Take 1  capsule (20 mg total) by mouth daily. 90 capsule 2  . methylphenidate (CONCERTA) 36 MG PO CR tablet Take 1 tablet (36 mg total) by mouth daily. 30 tablet 0  . methylphenidate (CONCERTA) 36 MG PO CR tablet Take 1 tablet (36 mg total) by mouth daily. 30 tablet 0  . methylphenidate (CONCERTA) 36 MG PO CR tablet Take 1 tablet (36 mg total) by mouth daily. 30 tablet 0  . nitrofurantoin, macrocrystal-monohydrate, (MACROBID) 100 MG capsule Take one twice daily for 5 days 10 capsule 0  . ondansetron (ZOFRAN ODT) 8 MG disintegrating tablet Take 1 tablet (8 mg total) by mouth every 8 (eight) hours as needed for nausea or vomiting. 12 tablet 3  . rizatriptan (MAXALT) 5 MG tablet Take 1 tablet (5 mg total) by mouth as needed for migraine. May repeat in 2 hours if needed 8 tablet 2   No current facility-administered medications for this visit.      Musculoskeletal: Strength & Muscle Tone: within normal limits Gait & Station: normal Patient leans: N/A  Psychiatric Specialty Exam: Review of Systems  All other systems reviewed and are negative.   unknown if currently breastfeeding.There is no height or weight on file to calculate BMI.  General Appearance: Casual and Fairly Groomed  Eye Contact:  Good  Speech:  Clear and Coherent  Volume:  Normal  Mood:  Euthymic  Affect:  Appropriate and Congruent  Thought Process:  Goal Directed  Orientation:  Full (Time, Place, and Person)  Thought Content: WDL   Suicidal Thoughts:  No   Homicidal Thoughts:  No  Memory:  Immediate;   Good Recent;   Good Remote;   Fair  Judgement:  Good  Insight:  Shallow  Psychomotor Activity:  Normal  Concentration:  Concentration: Good and Attention Span: Good  Recall:  [employer] of Knowledge: Fair  Language: Good  Akathisia:  No  Handed:  Right  AIMS (if indicated): not done  Assets:  Communication Skills Desire for Improvement Physical Health Resilience Social Support Talents/Skills  ADL's:  Intact  Cognition: Impaired,  Mild  Sleep:  Good   Screenings: PHQ2-9     Office Visit from 02/02/2017 in [place]  PHQ-2 Total Score  3  PHQ-9 Total Score  8       Assessment and Plan: This patient is a 23 year old female with a history of mild cognitive impairment depression and ADHD.  She is doing well on her current regimen.  She will continue Prozac 20 mg daily for depression and Concerta 36 mg every morning for ADD.  She will return to see me in 3 months   Levonne Spiller, MD 05/14/2019, 2:16 PM

## 2019-05-16 ENCOUNTER — Ambulatory Visit (INDEPENDENT_AMBULATORY_CARE_PROVIDER_SITE_OTHER): Payer: Medicaid Other | Admitting: Nurse Practitioner

## 2019-05-16 ENCOUNTER — Other Ambulatory Visit: Payer: Self-pay

## 2019-05-16 ENCOUNTER — Encounter: Payer: Self-pay | Admitting: Nurse Practitioner

## 2019-05-16 VITALS — Temp 97.9°F | Ht 60.0 in | Wt 162.6 lb

## 2019-05-16 DIAGNOSIS — N6012 Diffuse cystic mastopathy of left breast: Secondary | ICD-10-CM | POA: Diagnosis not present

## 2019-05-16 DIAGNOSIS — N6011 Diffuse cystic mastopathy of right breast: Secondary | ICD-10-CM

## 2019-05-16 NOTE — Progress Notes (Signed)
   Acute Office Visit  Subjective:    Patient ID: Lacey Hartman, female    DOB: [birth year], 23 y.o.   MRN: [id number]  Chief Complaint  Patient presents with  . 2 knots in right breast    HPI Patient is in today for two lumps found in right breast.  She denies lumps in left breast.  States the lumps showed up two weeks ago.  They are not painful.  LMP one week ago, she states she had normal amount of bleeding for two days.  Denies nipple discharge and redness.  Patient was in the office two weeks ago for new implant of nexplanon.  Discussed family medical history and risk of breast cancer.  Past Medical History:  Diagnosis Date  . ADHD (attention deficit hyperactivity disorder)   . Asthma    inhaler prn  . Depression   . Hard of hearing    Family History  Maternal grandmother - diagnosis of breast cancer age 52 y.o. Maternal great grandmother - diagnosis of breast cancer, unknown age Paternal grandmother - diagnosis of breast cancer age 77 y.o.   Allergies  Allergen Reactions  . Codeine Hives, Swelling and Rash    & hallucinations/    Review of Systems  Constitutional: Negative for chills and fever.  Breast: Positive for two lumps right breast. Negative for pain and tenderness. Negative for nipple discharge, redness, change in size of breasts. Gynecological: LMP 1 week ago. Light bleeding 2-3 days cycle.  No discharge, abnormal color, odor.     Objective:    Physical Exam  Constitutional: She appears well-developed and well-nourished.  Pulmonary/Chest: Effort normal. Right breast exhibits no nipple discharge and no skin change. Left breast exhibits no nipple discharge and no skin change. There is breast tenderness. No breast swelling, discharge or bleeding.  Abdominal: Soft.  Lymphadenopathy:    She has no axillary adenopathy.       Right axillary: No pectoral and no lateral adenopathy present.       Left axillary: No pectoral and no lateral adenopathy present.  Neurological: She is alert.  Skin: Skin is warm and dry.  Vitals reviewed.  Breast examination: There is dense tissue and multiple small rubbery mobile cysts in outer quadrants of both breasts.  No dominant masses noted. Today's Vitals   05/16/19 0903  Temp: 97.9 F (36.6 C)  TempSrc: Oral  Weight: 162 lb 9.6 oz (73.8 kg)  Height: 5' (1.524 m)   Body mass index is 31.76 kg/m.       Assessment:   Problem List Items Addressed This Visit    None    Visit Diagnoses    Fibrocystic breast changes of both breasts    -  Primary        Plan:    Educated patient on hormonal changes and explained these findings could be related to recent nexplanon insertion.  Consider genetic counseling in the future due to  family history of breast cancer.  Decrease caffeine intake.  Recommend follow-up well woman exam this fall.

## 2019-05-16 NOTE — Progress Notes (Signed)
   Acute Office Visit  Subjective:    Patient ID: Lacey Hartman, female    DOB: [birth year], 23 y.o.   MRN: [id number]  Chief Complaint  Patient presents with  . 2 knots in right breast    HPI Patient is in today for two lumps found in right breast.  She denies lumps in left breast.  States the lumps showed up two weeks ago.  They are not painful.  LMP one week ago, she states she had normal amount of bleeding for two days.  Denies nipple discharge and redness.  Patient was in the office two weeks ago for new implant of nexplanon.  Discussed family medical history and risk of breast cancer.  Past Medical History:  Diagnosis Date  . ADHD (attention deficit hyperactivity disorder)   . Asthma    inhaler prn  . Depression   . Hard of hearing    Family History  Maternal grandmother - diagnosis of breast cancer age 16 y.o. Maternal great grandmother - diagnosis of breast cancer, unknown age Paternal grandmother - diagnosis of breast cancer age 19 y.o.   Allergies  Allergen Reactions  . Codeine Hives, Swelling and Rash    & hallucinations/    Review of Systems  Constitutional: Negative for chills and fever.  Breast: Positive for two lumps right breast. Negative for pain and tenderness. Negative for nipple discharge, redness, change in size of breasts. Gynecological: LMP 1 week ago. Light bleeding 2-3 days cycle.  No discharge, abnormal color, odor.     Objective:    Physical Exam  Constitutional: She appears well-developed and well-nourished.  Pulmonary/Chest: Effort normal. Right breast exhibits no nipple discharge and no skin change. Left breast exhibits no nipple discharge and no skin change. There is breast tenderness. No breast swelling, discharge or bleeding.  Abdominal: Soft.  Lymphadenopathy:    She has no axillary adenopathy.       Right axillary: No pectoral and no lateral adenopathy present.       Left axillary: No pectoral and no lateral adenopathy present.  Neurological: She is alert.  Skin: Skin is warm and dry.  Vitals reviewed.  Breast examination: There is dense tissue and multiple small rubbery mobile cysts in outer quadrants of both breasts.  No dominant masses noted. Today's Vitals   05/16/19 0903  Temp: 97.9 F (36.6 C)  TempSrc: Oral  Weight: 162 lb 9.6 oz (73.8 kg)  Height: 5' (1.524 m)   Body mass index is 31.76 kg/m.       Assessment:   Problem List Items Addressed This Visit    None    Visit Diagnoses    Fibrocystic breast changes of both breasts    -  Primary        Plan:    Educated patient on hormonal changes and explained these findings could be related to recent nexplanon insertion.

## 2019-05-29 ENCOUNTER — Other Ambulatory Visit: Payer: Self-pay

## 2019-05-29 ENCOUNTER — Ambulatory Visit (INDEPENDENT_AMBULATORY_CARE_PROVIDER_SITE_OTHER): Payer: Medicaid Other | Admitting: Family Medicine

## 2019-05-29 DIAGNOSIS — H66004 Acute suppurative otitis media without spontaneous rupture of ear drum, recurrent, right ear: Secondary | ICD-10-CM

## 2019-05-29 MED ORDER — CEFDINIR 300 MG PO CAPS: 300.0000 mg | ORAL_CAPSULE | Freq: Two times a day (BID) | ORAL | 0 refills | Status: DC

## 2019-05-29 MED ORDER — NEOMYCIN-POLYMYXIN-HC 3.5-10000-1 OT SOLN: OTIC | 0 refills | Status: DC

## 2019-05-29 NOTE — Addendum Note (Signed)
Addended by: Carmelina Noun on: 05/29/2019 11:22 AM   Modules accepted: Orders

## 2019-05-29 NOTE — Progress Notes (Signed)
   Subjective:  aud plus vid  Patient ID: Lacey Hartman, female    DOB: August 13, 1996, 23 y.o.   MRN: [id number]  Otalgia  There is pain in the right ear. This is a new problem. Episode onset: 5 days. There has been no fever. Associated symptoms include ear discharge. She has tried heat packs for the symptoms.   Virtual Visit via Telephone Note  I connected with ANAYELI AREL on 05/29/19 at 10:00 AM EDT by telephone and verified that I am speaking with the correct person using two identifiers.  Location: Patient: home Provider: office   I discussed the limitations, risks, security and privacy concerns of performing an evaluation and management service by telephone and the availability of in person appointments. I also discussed with the patient that there may be a patient responsible charge related to this service. The patient expressed understanding and agreed to proceed.   History of Present Illness:    Observations/Objective:   Assessment and Plan:   Follow Up Instructions:    I discussed the assessment and treatment plan with the patient. The patient was provided an opportunity to ask questions and all were answered. The patient agreed with the plan and demonstrated an understanding of the instructions.   The patient was advised to call back or seek an in-person evaluation if the symptoms worsen or if the condition fails to improve as anticipated.  I provided 20 minutes of non-face-to-face time during this encounter.   Patient complains of ear pain.  Right side.  Sharp at times achy at times.  Perhaps some congestion.  History of recurrent otitis on that side.  See prior notes.  No fever no chills no chest symptoms no cough no sore throat   Review of Systems  HENT: Positive for ear discharge and ear pain.        Objective:   Physical Exam  Virtual      Assessment & Plan:  Impression probable otitis media.  Patient did mention discharge but on further history  says only slight.  Will add eardrops just in case.  Oral antibiotics 10 days symptom care discussed

## 2019-07-20 ENCOUNTER — Ambulatory Visit (INDEPENDENT_AMBULATORY_CARE_PROVIDER_SITE_OTHER): Payer: Medicaid Other | Admitting: Nurse Practitioner

## 2019-07-20 ENCOUNTER — Encounter: Payer: Self-pay | Admitting: Nurse Practitioner

## 2019-07-20 ENCOUNTER — Other Ambulatory Visit: Payer: Self-pay

## 2019-07-20 VITALS — BP 120/84 | Ht 62.0 in | Wt 163.0 lb

## 2019-07-20 DIAGNOSIS — R102 Pelvic and perineal pain: Secondary | ICD-10-CM

## 2019-07-20 DIAGNOSIS — Z0000 Encounter for general adult medical examination without abnormal findings: Secondary | ICD-10-CM

## 2019-07-20 DIAGNOSIS — Z124 Encounter for screening for malignant neoplasm of cervix: Secondary | ICD-10-CM

## 2019-07-20 DIAGNOSIS — Z113 Encounter for screening for infections with a predominantly sexual mode of transmission: Secondary | ICD-10-CM

## 2019-07-20 LAB — POCT URINALYSIS DIPSTICK
Protein, UA: POSITIVE — AB
Spec Grav, UA: 1.025 (ref 1.010–1.025)
Urobilinogen, UA: 0.2 E.U./dL
pH, UA: 5 (ref 5.0–8.0)

## 2019-07-20 LAB — POCT URINE PREGNANCY: Preg Test, Ur: NEGATIVE

## 2019-07-20 MED ORDER — FLUOXETINE HCL 40 MG PO CAPS: 40.0000 mg | ORAL_CAPSULE | Freq: Every day | ORAL | 2 refills | Status: DC

## 2019-07-20 NOTE — Patient Instructions (Addendum)
-Start cutting back on sodas. Drinking 10 sodas a day increases acid reflux, abdominal bloating, and can cause increase heart rate. Start by decreasing the amount of soda to 9 a day. In the next 3-5 days, decrease it by one and continue decreasing the amount of soda until you are only drink 2 sodas maximum per day. Try to avoid soda in the evening.  Irritable Bowel Syndrome, Adult  Irritable bowel syndrome (IBS) is a group of symptoms that affects the organs responsible for digestion (gastrointestinal or GI tract). IBS is not one specific disease. To regulate how the GI tract works, the body sends signals back and forth between the intestines and the brain. If you have IBS, there may be a problem with these signals. As a result, the GI tract does not function normally. The intestines may become more sensitive and overreact to certain things. This may be especially true when you eat certain foods or when you are under stress. There are four types of IBS. These may be determined based on the consistency of your stool (feces):  IBS with diarrhea.  IBS with constipation.  Mixed IBS.  Unsubtyped IBS. It is important to know which type of IBS you have. Certain treatments are more likely to be helpful for certain types of IBS. What are the causes? The exact cause of IBS is not known. What increases the risk? You may have a higher risk for IBS if you:  Are female.  Are younger than 51.  Have a family history of IBS.  Have a mental health condition, such as depression, anxiety, or post-traumatic stress disorder.  Have had a bacterial infection of your GI tract. What are the signs or symptoms? Symptoms of IBS vary from person to person. The main symptom is abdominal pain or discomfort. Other symptoms usually include one or more of the following:  Diarrhea, constipation, or both.  Abdominal swelling or bloating.  Feeling full after eating a small or regular-sized meal.  Frequent gas.   Mucus in the stool.  A feeling of having more stool left after a bowel movement. Symptoms tend to come and go. They may be triggered by stress, mental health conditions, or certain foods. How is this diagnosed? This condition may be diagnosed based on a physical exam, your medical history, and your symptoms. You may have tests, such as:  Blood tests.  Stool test.  X-rays.  CT scan.  Colonoscopy. This is a procedure in which your GI tract is viewed with a long, thin, flexible tube. How is this treated? There is no cure for IBS, but treatment can help relieve symptoms. Treatment depends on the type of IBS you have, and may include:  Changes to your diet, such as: ? Avoiding foods that cause symptoms. ? Drinking more water. ? Following a low-FODMAP (fermentable oligosaccharides, disaccharides, monosaccharides, and polyols) diet for up to 6 weeks, or as told by your health care provider. FODMAPs are sugars that are hard for some people to digest. ? Eating more fiber. ? Eating medium-sized meals at the same times every day.  Medicines. These may include: ? Fiber supplements, if you have constipation. ? Medicine to control diarrhea (antidiarrheal medicines). ? Medicine to help control muscle tightening (spasms) in your GI tract (antispasmodic medicines). ? Medicines to help with mental health conditions, such as antidepressants or tranquilizers.  Talk therapy or counseling.  Working with a diet and nutrition specialist (dietitian) to help create a food plan that is right for you.  Managing your stress. Follow these instructions at home: Eating and drinking  Eat a healthy diet.  Eat medium-sized meals at about the same time every day. Do not eat large meals.  Gradually eat more fiber-rich foods. These include whole grains, fruits, and vegetables. This may be especially helpful if you have IBS with constipation.  Eat a diet low in FODMAPs.  Drink enough fluid to keep your  urine pale yellow.  Keep a journal of foods that seem to trigger symptoms.  Avoid foods and drinks that: ? Contain added sugar. ? Make your symptoms worse. Dairy products, caffeinated drinks, and carbonated drinks can make symptoms worse for some people. General instructions  Take over-the-counter and prescription medicines and supplements only as told by your health care provider.  Get enough exercise. Do at least 150 minutes of moderate-intensity exercise each week.  Manage your stress. Getting enough sleep and exercise can help you manage stress.  Keep all follow-up visits as told by your health care provider and therapist. This is important. Alcohol Use  Do not drink alcohol if: ? Your health care provider tells you not to drink. ? You are pregnant, may be pregnant, or are planning to become pregnant.  If you drink alcohol, limit how much you have: ? 0-1 drink a day for women. ? 0-2 drinks a day for men.  Be aware of how much alcohol is in your drink. In the [place] one drink equals one typical bottle of beer (12 oz), one-half glass of wine (5 oz), or one shot of hard liquor (1 oz). Contact a health care provider if you have:  Constant pain.  Weight loss.  Difficulty or pain when swallowing.  Diarrhea that gets worse. Get help right away if you have:  Severe abdominal pain.  Fever.  Diarrhea with symptoms of dehydration, such as dizziness or dry mouth.  Bright red blood in your stool.  Stool that is black and tarry.  Abdominal swelling.  Vomiting that does not stop.  Blood in your vomit. Summary  Irritable bowel syndrome (IBS) is not one specific disease. It is a group of symptoms that affects digestion.  Your intestines may become more sensitive and overreact to certain things. This may be especially true when you eat certain foods or when you are under stress.  There is no cure for IBS, but treatment can help relieve symptoms. This information is not  intended to replace advice given to you by your health care provider. Make sure you discuss any questions you have with your health care provider. Document Released: 10/04/2005 Document Revised: 09/27/2017 Document Reviewed: 09/27/2017 Elsevier Patient Education  2020 [employer].

## 2019-07-20 NOTE — Progress Notes (Signed)
   Subjective:    Patient ID: Lacey Hartman, female    DOB: 13-May-1996, 23 y.o.   MRN: [id number]  HPI The patient comes in today for a wellness visit.    A review of their health history was completed.  A review of medications was also completed.  Any needed refills; ear drops  Eating habits:  Tries to be a little healthy not much  Falls/  MVA accidents in past few months: none  Regular exercise: has 3 kids  Specialist pt sees on regular basis: ear dr; Dulcy Fanny  Preventative health issues were discussed.   Additional concerns: pt states that when she wakes up "she feels really skinny" but as the day goes on she begins to have some bloating.  Pt states that ENT states that she needs to have her right ear cleaned out every 6 months or so due to patient wearing a hearing aid.    Review of Systems     Objective:   Physical Exam        Assessment & Plan:

## 2019-07-20 NOTE — Progress Notes (Signed)
Subjective:    Patient ID: Lacey Hartman, female    DOB: 31-Oct-1995, 23 y.o.   MRN: [id number]  HPI The patient comes in today for a wellness visit. Pt complaining about abdominal bloating and pelvic pain. States bloating and pelvic pain are constant throughout the day. Had Nexplanon placed in July. No breakthrough bleeding noted. Pt has some acid reflux. Drinks 10 caffeinated sodas a day and eats pizza multiple times per week. Limited physical activity, however pt works in a daycare. Occasionally, pt will feel heart racing and have migraines. Denies chest pain, syncope, dizziness, or fainting spells. Sleeps 7-8 hours per day. Pt is in a monogamous relationship. Pt has regular dental exams. Declines flu vaccine.    A review of their health history was completed.  A review of medications was also completed.  Any needed refills; ear drops-will need to be refilled by ENT  Eating habits:  Tries to be a little healthy not much  Falls/  MVA accidents in past few months: none  Specialist pt sees on regular basis: ENT Dr.Ross; Behavioral specialist  Preventative health issues were discussed.   Drugs/Alcohol: Pt is a current smoker 1/3 ppd. Desires to quit.     Review of Systems  Constitutional: Negative for activity change, appetite change, chills and fever.  HENT: Negative for ear pain, sinus pain and sneezing.   Respiratory: Negative for cough, shortness of breath and wheezing.   Cardiovascular: Negative for chest pain and leg swelling.  Gastrointestinal: Positive for abdominal pain. Negative for anal bleeding, blood in stool, diarrhea, nausea and vomiting.  Genitourinary: Positive for pelvic pain. Negative for difficulty urinating, dysuria, flank pain, frequency, genital sores, menstrual problem, vaginal bleeding and vaginal discharge.  Musculoskeletal: Negative for back pain and myalgias.  Neurological: Negative for dizziness, syncope, weakness, light-headedness and numbness.   Psychiatric/Behavioral: Negative for confusion.   Depression screen [facility] 2/9 07/20/2019 02/02/2017 02/02/2017  Decreased Interest 1 1 3   Down, Depressed, Hopeless 0 2 1  PHQ - 2 Score 1 3 4   Altered sleeping 0 1 -  Tired, decreased energy 1 2 -  Change in appetite 1 1 -  Feeling bad or failure about yourself  0 0 -  Trouble concentrating 1 1 -  Moving slowly or fidgety/restless 0 0 -  Suicidal thoughts 0 0 -  PHQ-9 Score 4 8 -  Difficult doing work/chores Somewhat difficult Somewhat difficult -       Objective:   Physical Exam Exam conducted with a chaperone present.  Constitutional:      Appearance: Normal appearance.  HENT:     Ears:     Comments: Hearing aid in the right ear  Neck:     Thyroid: No thyroid mass or thyromegaly.  Cardiovascular:     Rate and Rhythm: Normal rate and regular rhythm.     Heart sounds: No murmur.  Pulmonary:     Effort: No respiratory distress.     Breath sounds: Normal breath sounds. No stridor. No wheezing or rhonchi.  Chest:     Chest wall: No mass, lacerations, swelling or tenderness.     Breasts:        Right: Normal. No swelling, bleeding, inverted nipple or mass.        Left: Normal. No swelling, bleeding, inverted nipple or mass.     Comments: Breast: Fine nodularity in the outer region bilaterally Abdominal:     General: Abdomen is flat.     Palpations: There is no  mass.     Tenderness: There is abdominal tenderness. There is no rebound.     Hernia: No hernia is present.     Comments: Some tenderness noted in the lower abdominal/pelvic region. No masses palpated, no rebound tenderness, no guarding   Genitourinary:    Exam position: Lithotomy position.     Comments: External: No lesions or rashes present Internal: No lesions or rashes present. Vagina pink and moist. No CMT. Slight tenderness in mid pelvic region during bimanual exam. No masses palpated. Ovaries non-palpable  Skin:    General: Skin is warm and dry.  Neurological:      Mental Status: She is alert and oriented to person, place, and time.  Psychiatric:        Mood and Affect: Mood normal.        Behavior: Behavior normal.    Results for orders placed or performed in visit on 07/20/19  POCT Urinalysis Dipstick  Result Value Ref Range   Color, UA     Clarity, UA     Glucose, UA     Bilirubin, UA     Ketones, UA     Spec Grav, UA 1.025 1.010 - 1.025   Blood, UA     pH, UA 5.0 5.0 - 8.0   Protein, UA Positive (A) Negative   Urobilinogen, UA 0.2 0.2 or 1.0 E.U./dL   Nitrite, UA     Leukocytes, UA     Appearance     Odor    POCT urine pregnancy  Result Value Ref Range   Preg Test, Ur Negative Negative           Assessment & Plan:   Problem List Items Addressed This Visit    None    Visit Diagnoses    Routine general medical examination at a health care facility    -  Primary   Relevant Orders   CBC with Differential   COMPLETE METABOLIC PANEL WITH GFR   Lipid Profile   Pap IG and Chlamydia/Gonococcus, NAA   Pelvic pain       Relevant Orders   POCT Urinalysis Dipstick (Completed)   POCT urine pregnancy (Completed)   Screening for cervical cancer       Relevant Orders   Pap IG and Chlamydia/Gonococcus, NAA   Routine screening for STI (sexually transmitted infection)       Relevant Orders   Pap IG and Chlamydia/Gonococcus, NAA     -Discussed with patient the need to decrease soda intake. Pt currently drinking 10 sodas per day likely causing migraines, palpitations, and acid reflux symptoms. Plan to decrease soda intake provided in AVS and pt agrees. Encouraged smoking cessation, pt will try to quit on her own. Instructed if she needs support/medication to quit smoking, please contact us.   Return in about 1 year (around 07/19/2020) for physical.

## 2019-07-24 ENCOUNTER — Other Ambulatory Visit: Payer: Self-pay | Admitting: Nurse Practitioner

## 2019-07-24 DIAGNOSIS — Z0000 Encounter for general adult medical examination without abnormal findings: Secondary | ICD-10-CM | POA: Diagnosis not present

## 2019-07-25 LAB — COMPREHENSIVE METABOLIC PANEL
ALT: 21 IU/L (ref 0–32)
AST: 19 IU/L (ref 0–40)
Albumin/Globulin Ratio: 1.6 (ref 1.2–2.2)
Albumin: 4.5 g/dL (ref 3.9–5.0)
Alkaline Phosphatase: 88 IU/L (ref 39–117)
BUN/Creatinine Ratio: 12 (ref 9–23)
BUN: 12 mg/dL (ref 6–20)
Bilirubin Total: 0.4 mg/dL (ref 0.0–1.2)
CO2: 20 mmol/L (ref 20–29)
Calcium: 9.3 mg/dL (ref 8.7–10.2)
Chloride: 105 mmol/L (ref 96–106)
Creatinine, Ser: 0.97 mg/dL (ref 0.57–1.00)
GFR calc Af Amer: 95 mL/min/{1.73_m2} (ref >59)
GFR calc non Af Amer: 82 mL/min/{1.73_m2} (ref >59)
Globulin, Total: 2.8 g/dL (ref 1.5–4.5)
Glucose: 89 mg/dL (ref 65–99)
Potassium: 4.1 mmol/L (ref 3.5–5.2)
Sodium: 140 mmol/L (ref 134–144)
Total Protein: 7.3 g/dL (ref 6.0–8.5)

## 2019-07-25 LAB — CBC WITH DIFFERENTIAL/PLATELET
Basophils Absolute: 0 10*3/uL (ref 0.0–0.2)
Basos: 0 %
EOS (ABSOLUTE): 0.1 10*3/uL (ref 0.0–0.4)
Eos: 1 %
Hematocrit: 42 % (ref 34.0–46.6)
Hemoglobin: 13.6 g/dL (ref 11.1–15.9)
Immature Grans (Abs): 0 10*3/uL (ref 0.0–0.1)
Immature Granulocytes: 0 %
Lymphocytes Absolute: 2.3 10*3/uL (ref 0.7–3.1)
Lymphs: 26 %
MCH: 29.8 pg (ref 26.6–33.0)
MCHC: 32.4 g/dL (ref 31.5–35.7)
MCV: 92 fL (ref 79–97)
Monocytes Absolute: 0.5 10*3/uL (ref 0.1–0.9)
Monocytes: 6 %
Neutrophils Absolute: 6 10*3/uL (ref 1.4–7.0)
Neutrophils: 67 %
Platelets: 238 10*3/uL (ref 150–450)
RBC: 4.56 x10E6/uL (ref 3.77–5.28)
RDW: 12.4 % (ref 11.7–15.4)
WBC: 8.9 10*3/uL (ref 3.4–10.8)

## 2019-07-25 LAB — LIPID PANEL
Chol/HDL Ratio: 3.3 ratio (ref 0.0–4.4)
Cholesterol, Total: 147 mg/dL (ref 100–199)
HDL: 44 mg/dL (ref >39)
LDL Chol Calc (NIH): 89 mg/dL (ref 0–99)
Triglycerides: 73 mg/dL (ref 0–149)
VLDL Cholesterol Cal: 14 mg/dL (ref 5–40)

## 2019-07-26 LAB — PAP IG AND CT-NG NAA
Chlamydia, Nuc. Acid Amp: NEGATIVE
Gonococcus by Nucleic Acid Amp: NEGATIVE

## 2019-08-10 ENCOUNTER — Other Ambulatory Visit: Payer: Self-pay

## 2019-08-10 DIAGNOSIS — Z20822 Contact with and (suspected) exposure to covid-19: Secondary | ICD-10-CM

## 2019-08-12 LAB — NOVEL CORONAVIRUS, NAA: SARS-CoV-2, NAA: NOT DETECTED

## 2019-08-14 ENCOUNTER — Other Ambulatory Visit: Payer: Self-pay

## 2019-08-14 ENCOUNTER — Encounter (HOSPITAL_COMMUNITY): Payer: Self-pay | Admitting: Psychiatry

## 2019-08-14 ENCOUNTER — Ambulatory Visit (INDEPENDENT_AMBULATORY_CARE_PROVIDER_SITE_OTHER): Payer: Medicaid Other | Admitting: Psychiatry

## 2019-08-14 DIAGNOSIS — F900 Attention-deficit hyperactivity disorder, predominantly inattentive type: Secondary | ICD-10-CM

## 2019-08-14 DIAGNOSIS — F331 Major depressive disorder, recurrent, moderate: Secondary | ICD-10-CM

## 2019-08-14 MED ORDER — FLUOXETINE HCL 40 MG PO CAPS: 40.0000 mg | ORAL_CAPSULE | Freq: Every day | ORAL | 2 refills | Status: DC

## 2019-08-14 MED ORDER — METHYLPHENIDATE HCL ER (OSM) 36 MG PO TBCR: 36.0000 mg | EXTENDED_RELEASE_TABLET | Freq: Every day | ORAL | 0 refills | Status: DC

## 2019-08-14 NOTE — Progress Notes (Signed)
Virtual Visit via Telephone Note  I connected with PURPOSE BARCA on 08/14/19 at  2:00 PM EDT by telephone and verified that I am speaking with the correct person using two identifiers.   I discussed the limitations, risks, security and privacy concerns of performing an evaluation and management service by telephone and the availability of in person appointments. I also discussed with the patient that there may be a patient responsible charge related to this service. The patient expressed understanding and agreed to proceed.     I discussed the assessment and treatment plan with the patient. The patient was provided an opportunity to ask questions and all were answered. The patient agreed with the plan and demonstrated an understanding of the instructions.   The patient was advised to call back or seek an in-person evaluation if the symptoms worsen or if the condition fails to improve as anticipated.  I provided 15 minutes of non-face-to-face time during this encounter.   Levonne Spiller, MD  [facility] MD/PA/NP OP Progress Note  08/14/2019 2:11 PM CAMIKA APICELLA  MRN:  [id number]  Chief Complaint:  Chief Complaint    Depression; Anxiety; Follow-up; ADHD     HPI: This patient is a 23 year old single white female lives with her boyfriend 2 daughters ages5and 3years and one son age 16in 15. 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  The patient was referred by her primary physician, Dr. Sallee Lange, for further assessment and treatment of ADD and depression.  The patient states that she has always struggled in school. She doesn't know much about her history but does know that she was born full-term but spent most of her first year of life in the NICU at [facility]. She is not sure why. She was diagnosed with hearing loss and required speech therapy although way through school. She had trouble learning to read and write and despite being an exceptional children's classes  still does not read or write very well. When she was 23 years old in [place] she was very hyperactive and unfocused and distractible. At that time she was diagnosed with ADHD and was on Ritalin for a while and later Adderall. She stopped these medications when she became pregnant at age 78.  The patient states that she used to go to [facility] because of depression. The physician there put her on Prozac last year and she is still taking it although she has not been back to that clinic in quite some time. She states that she still has difficulties with bad temper crying spells anger anxiety and mood swings. At times she gets overwhelmed in caring for the children. Her husband has seizures and is on disability so he is able to be at home and help her most of the time. She also gets anxious and nervous around new people. She denies auditory or visual hallucinations or paranoia. She does not use drugs or alcohol. She is now using an Implanon implant to prevent pregnancy as she has had so many children in such a short amount of time.  The patient states she is interested in getting back on medication for ADHD. She's no longer hyperactive but very unfocused distractible and on able to finish tasks  The patient returns for follow-up after 3 months.  She states she is stressed right now because her 34-year-old daughter has COVID-19.  The rest of the family was tested and they are all negative.  She does not know how the child could have  gotten it because she is not going to in person school and does not really go anywhere else except the grocery store and always wears her mask.  The child did have a fever and congestion but now her fever is gone but she is still coughing.  The patient has not yet informed the child's pediatrician I urged her to do so.  He states that she is quite anxious right now but the medication is helping her depression and focus. Visit Diagnosis:    ICD-10-CM   1. Attention deficit  hyperactivity disorder (ADHD), predominantly inattentive type  F90.0   2. Moderate episode of recurrent major depressive disorder (HCC)  F33.1     Past Psychiatric History: none  Past Medical History:  Past Medical History:  Diagnosis Date  . ADHD (attention deficit hyperactivity disorder)   . Asthma    inhaler prn  . Depression   . Hard of hearing     Past Surgical History:  Procedure Laterality Date  . NO PAST SURGERIES      Family Psychiatric History: see below  Family History:  Family History  Problem Relation Age of Onset  . Depression Mother   . Anxiety disorder Mother   . Bipolar disorder Mother   . Cancer Maternal Grandmother   . Cancer Maternal Uncle   . ADD / ADHD Brother   . ADD / ADHD Brother     Social History:  Social History   Socioeconomic History  . Marital status: Single    Spouse name: Not on file  . Number of children: Not on file  . Years of education: Not on file  . Highest education level: Not on file  Occupational History  . Not on file  Social Needs  . Financial resource strain: Not on file  . Food insecurity    Worry: Not on file    Inability: Not on file  . Transportation needs    Medical: Not on file    Non-medical: Not on file  Tobacco Use  . Smoking status: Current Every Day Smoker    Packs/day: 0.33    Years: 7.00    Pack years: 2.31    Types: Cigarettes  . Smokeless tobacco: Never Used  . Tobacco comment: Stopped in 2017, started smoking again 2019  Substance and Sexual Activity  . Alcohol use: No    Alcohol/week: 0.0 standard drinks  . Drug use: No  . Sexual activity: Yes    Birth control/protection: None, Implant  Lifestyle  . Physical activity    Days per week: Not on file    Minutes per session: Not on file  . Stress: Not on file  Relationships  . Social Herbalist on phone: Not on file    Gets together: Not on file    Attends religious service: Not on file    Active member of club or  organization: Not on file    Attends meetings of clubs or organizations: Not on file    Relationship status: Not on file  Other Topics Concern  . Not on file  Social History Narrative  . Not on file    Allergies:  Allergies  Allergen Reactions  . Codeine Hives, Swelling and Rash    & hallucinations/    Metabolic Disorder Labs: No results found for: HGBA1C, MPG No results found for: PROLACTIN Lab Results  Component Value Date   CHOL 147 07/24/2019   TRIG 73 07/24/2019   HDL 44 07/24/2019  CHOLHDL 3.3 07/24/2019   LDLCALC 89 07/24/2019   LDLCALC 85 02/16/2017   Lab Results  Component Value Date   TSH 1.700 02/16/2017    Therapeutic Level Labs: No results found for: LITHIUM No results found for: VALPROATE No components found for:  CBMZ  Current Medications: Current Outpatient Medications  Medication Sig Dispense Refill  . etonogestrel (NEXPLANON) 68 MG IMPL implant 1 each by Subdermal route once.    Marland Kitchen FLUoxetine (PROZAC) 40 MG capsule Take 1 capsule (40 mg total) by mouth daily. 30 capsule 2  . methylphenidate (CONCERTA) 36 MG PO CR tablet Take 1 tablet (36 mg total) by mouth daily. 30 tablet 0  . methylphenidate (CONCERTA) 36 MG PO CR tablet Take 1 tablet (36 mg total) by mouth daily. 30 tablet 0  . methylphenidate (CONCERTA) 36 MG PO CR tablet Take 1 tablet (36 mg total) by mouth daily. 30 tablet 0  . neomycin-polymyxin-hydrocortisone (CORTISPORIN) OTIC solution 4 drops qid for 5 days 10 mL 0   No current facility-administered medications for this visit.      Musculoskeletal: Strength & Muscle Tone: within normal limits Gait & Station: normal Patient leans: N/A  Psychiatric Specialty Exam: Review of Systems  Psychiatric/Behavioral: The patient is nervous/anxious.   All other systems reviewed and are negative.   unknown if currently breastfeeding.There is no height or weight on file to calculate BMI.  General Appearance: Casual and Fairly Groomed  Eye  Contact:  Good  Speech:  Clear and Coherent  Volume:  Normal  Mood:  Anxious  Affect:  Appropriate and Congruent  Thought Process:  Goal Directed  Orientation:  Full (Time, Place, and Person)  Thought Content: Rumination   Suicidal Thoughts:  No  Homicidal Thoughts:  No  Memory:  Immediate;   Good Recent;   Good Remote;   Fair  Judgement:  Fair  Insight:  Fair  Psychomotor Activity:  Normal  Concentration:  Concentration: Fair and Attention Span: Fair  Recall:  [employer] of Knowledge: Fair  Language: Good  Akathisia:  No  Handed:  Right  AIMS (if indicated): not done  Assets:  Communication Skills Desire for Improvement Physical Health Resilience Social Support Talents/Skills  ADL's:  Intact  Cognition: Impaired,  Mild  Sleep:  Fair   Screenings: PHQ2-9     Office Visit from 07/20/2019 in [place] Office Visit from 02/02/2017 in [place]  PHQ-2 Total Score  1  3  PHQ-9 Total Score  4  8       Assessment and Plan: This patient is a 23 year old female with a history of mild cognitive impairment, depression and ADHD.  She is very stressed because her daughter has coronavirus but the daughter is recovering.  She states that her medications continue to be helpful.  She will continue Prozac 20 mg daily for depression and Concerta 36 mg every morning for ADD.  She will return to see me in 3 months   Levonne Spiller, MD 08/14/2019, 2:11 PM

## 2019-11-14 ENCOUNTER — Encounter (HOSPITAL_COMMUNITY): Payer: Self-pay | Admitting: Psychiatry

## 2019-11-14 ENCOUNTER — Ambulatory Visit (INDEPENDENT_AMBULATORY_CARE_PROVIDER_SITE_OTHER): Payer: Medicaid Other | Admitting: Psychiatry

## 2019-11-14 ENCOUNTER — Other Ambulatory Visit: Payer: Self-pay

## 2019-11-14 DIAGNOSIS — F331 Major depressive disorder, recurrent, moderate: Secondary | ICD-10-CM | POA: Diagnosis not present

## 2019-11-14 DIAGNOSIS — F900 Attention-deficit hyperactivity disorder, predominantly inattentive type: Secondary | ICD-10-CM

## 2019-11-14 MED ORDER — METHYLPHENIDATE HCL ER (OSM) 36 MG PO TBCR: 36.0000 mg | EXTENDED_RELEASE_TABLET | Freq: Every day | ORAL | 0 refills | Status: DC

## 2019-11-14 MED ORDER — FLUOXETINE HCL 40 MG PO CAPS: 40.0000 mg | ORAL_CAPSULE | Freq: Every day | ORAL | 2 refills | Status: DC

## 2019-11-14 NOTE — Progress Notes (Signed)
Virtual Visit via Telephone Note  I connected with Lacey Hartman on 11/14/19 at  1:00 PM EST by telephone and verified that I am speaking with the correct person using two identifiers.   I discussed the limitations, risks, security and privacy concerns of performing an evaluation and management service by telephone and the availability of in person appointments. I also discussed with the patient that there may be a patient responsible charge related to this service. The patient expressed understanding and agreed to proceed.    I discussed the assessment and treatment plan with the patient. The patient was provided an opportunity to ask questions and all were answered. The patient agreed with the plan and demonstrated an understanding of the instructions.   The patient was advised to call back or seek an in-person evaluation if the symptoms worsen or if the condition fails to improve as anticipated.  I provided 15 minutes of non-face-to-face time during this encounter.   Lacey Spiller, MD  [facility] MD/PA/NP OP Progress Note  11/14/2019 1:18 PM BRYANA Hartman  MRN:  [id number]  Chief Complaint:  Chief Complaint    Depression; ADHD; Follow-up     HPI: This patient is a 56 year old single white female lives with her boyfriend 2 daughters ages6and 5years and one son age 76in [place]. 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  The patient was referred by her primary physician, Dr. Sallee Lange, for further assessment and treatment of ADD and depression.  The patient states that she has always struggled in school. 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. She is not sure why. She was diagnosed with hearing loss and required speech therapy although way through school. She had trouble learning to read and write and despite being an exceptional children's classes still does not  read or write very well. When she was 24 years old in [place] she was very hyperactive and unfocused and distractible. At that time she was diagnosed with ADHD and was on Ritalin for a while and later Adderall. She stopped these medications when she became pregnant at age 24.  The patient states that she used to go to [facility] because of depression. The physician there put her on Prozac last year and she is still taking it although she has not been back to that clinic in quite some time. She states that she still has difficulties with bad temper crying spells anger anxiety and mood swings. At times she gets overwhelmed in caring for the children. Her husband has seizures and is on disability so he is able to be at home and help her most of the time. She also gets anxious and nervous around new people. She denies auditory or visual hallucinations or paranoia. She does not use drugs or alcohol. She is now using an Implanon implant to prevent pregnancy as she has had so many children in such a short amount of time.  The patient states she is interested in getting back on medication for ADHD. She's no longer hyperactive but very unfocused distractible and on able to finish tasks  The patient returns for follow-up after 3 months.  She states she is doing well now but a couple of months ago her father-in-law caught coronavirus and had to be hospitalized and was intubated.  He is doing much better now and is back at home.  She states that this put her family through a lot of stress.  Her 2 older children are going back to school which has been helpful.  She states that she does what she can to help them when they are home doing virtual learning.  Overall her mood is good she is able to stay focused and she is sleeping well.  She does not have any crying spells or panic attacks. Visit Diagnosis:    ICD-10-CM   1. Attention deficit hyperactivity disorder (ADHD), predominantly inattentive type  F90.0   2.  Moderate episode of recurrent major depressive disorder (HCC)  F33.1     Past Psychiatric History: none  Past Medical History:  Past Medical History:  Diagnosis Date  . ADHD (attention deficit hyperactivity disorder)   . Asthma    inhaler prn  . Depression   . Hard of hearing     Past Surgical History:  Procedure Laterality Date  . NO PAST SURGERIES      Family Psychiatric History: see below   Family History:  Family History  Problem Relation Age of Onset  . Depression Mother   . Anxiety disorder Mother   . Bipolar disorder Mother   . Cancer Maternal Grandmother   . Cancer Maternal Uncle   . ADD / ADHD Brother   . ADD / ADHD Brother     Social History:  Social History   Socioeconomic History  . Marital status: Single    Spouse name: Not on file  . Number of children: Not on file  . Years of education: Not on file  . Highest education level: Not on file  Occupational History  . Not on file  Tobacco Use  . Smoking status: Current Every Day Smoker    Packs/day: 0.33    Years: 7.00    Pack years: 2.31    Types: Cigarettes  . Smokeless tobacco: Never Used  . Tobacco comment: Stopped in 2017, started smoking again 2019  Substance and Sexual Activity  . Alcohol use: No    Alcohol/week: 0.0 standard drinks  . Drug use: No  . Sexual activity: Yes    Birth control/protection: None, Implant  Other Topics Concern  . Not on file  Social History Narrative  . Not on file   Social Determinants of Health   Financial Resource Strain:   . Difficulty of Paying Living Expenses: Not on file  Food Insecurity:   . Worried About Charity fundraiser in the Last Year: Not on file  . Ran Out of Food in the Last Year: Not on file  Transportation Needs:   . Lack of Transportation (Medical): Not on file  . Lack of Transportation (Non-Medical): Not on file  Physical Activity:   . Days of Exercise per Week: Not on file  . Minutes of Exercise per Session: Not on file   Stress:   . Feeling of Stress : Not on file  Social Connections:   . Frequency of Communication with Friends and Family: Not on file  . Frequency of Social Gatherings with Friends and Family: Not on file  . Attends Religious Services: Not on file  . Active Member of Clubs or Organizations: Not on file  . Attends Archivist Meetings: Not on file  . Marital Status: Not on file    Allergies:  Allergies  Allergen Reactions  . Codeine Hives, Swelling and Rash    & hallucinations/    Metabolic Disorder Labs: No results found for: HGBA1C,  MPG No results found for: PROLACTIN Lab Results  Component Value Date   CHOL 147 07/24/2019   TRIG 73 07/24/2019   HDL 44 07/24/2019   CHOLHDL 3.3 07/24/2019   LDLCALC 89 07/24/2019   LDLCALC 85 02/16/2017   Lab Results  Component Value Date   TSH 1.700 02/16/2017    Therapeutic Level Labs: No results found for: LITHIUM No results found for: VALPROATE No components found for:  CBMZ  Current Medications: Current Outpatient Medications  Medication Sig Dispense Refill  . etonogestrel (NEXPLANON) 68 MG IMPL implant 1 each by Subdermal route once.    Marland Kitchen FLUoxetine (PROZAC) 40 MG capsule Take 1 capsule (40 mg total) by mouth daily. 30 capsule 2  . methylphenidate (CONCERTA) 36 MG PO CR tablet Take 1 tablet (36 mg total) by mouth daily. 30 tablet 0  . methylphenidate (CONCERTA) 36 MG PO CR tablet Take 1 tablet (36 mg total) by mouth daily. 30 tablet 0  . methylphenidate (CONCERTA) 36 MG PO CR tablet Take 1 tablet (36 mg total) by mouth daily. 30 tablet 0  . neomycin-polymyxin-hydrocortisone (CORTISPORIN) OTIC solution 4 drops qid for 5 days 10 mL 0   No current facility-administered medications for this visit.     Musculoskeletal: Strength & Muscle Tone: within normal limits Gait & Station: normal Patient leans: N/A  Psychiatric Specialty Exam: Review of Systems  All other systems reviewed and are negative.   unknown if  currently breastfeeding.There is no height or weight on file to calculate BMI.  General Appearance: NA  Eye Contact:  NA  Speech:  Clear and Coherent  Volume:  Normal  Mood:  Euthymic  Affect:  NA  Thought Process:  Goal Directed  Orientation:  Full (Time, Place, and Person)  Thought Content: WDL   Suicidal Thoughts:  No  Homicidal Thoughts:  No  Memory:  Immediate;   Good Recent;   Good Remote;   Fair  Judgement:  Good  Insight:  Fair  Psychomotor Activity:  Normal  Concentration:  Concentration: Good and Attention Span: Good  Recall:  Good  Fund of Knowledge: Fair  Language: Good  Akathisia:  No  Handed:  Right  AIMS (if indicated): not done  Assets:  Communication Skills Desire for Improvement Physical Health Resilience Social Support Talents/Skills  ADL's:  Intact  Cognition: Impaired,  Mild  Sleep:  Good   Screenings: PHQ2-9     Office Visit from 07/20/2019 in [place] Office Visit from 02/02/2017 in [place]  PHQ-2 Total Score  1  3  PHQ-9 Total Score  4  8       Assessment and Plan: This patient is a 24 year old female with a history of mild cognitive impairment depression ADD.  She continues to do very well on her regimen.  She will continue Prozac 20 mg daily for depression on Concerta 36 mg every morning for ADD.  She will return to see me in 3 months   Lacey Spiller, MD 11/14/2019, 1:18 PM

## 2019-11-22 ENCOUNTER — Encounter: Payer: Self-pay | Admitting: Family Medicine

## 2019-11-22 ENCOUNTER — Other Ambulatory Visit: Payer: Self-pay

## 2019-11-22 ENCOUNTER — Ambulatory Visit (INDEPENDENT_AMBULATORY_CARE_PROVIDER_SITE_OTHER): Payer: Medicaid Other | Admitting: Family Medicine

## 2019-11-22 VITALS — BP 104/70 | HR 90 | Temp 96.2°F | Resp 15 | Ht 60.0 in | Wt 165.1 lb

## 2019-11-22 DIAGNOSIS — G44009 Cluster headache syndrome, unspecified, not intractable: Secondary | ICD-10-CM

## 2019-11-22 DIAGNOSIS — L819 Disorder of pigmentation, unspecified: Secondary | ICD-10-CM

## 2019-11-22 DIAGNOSIS — Z87891 Personal history of nicotine dependence: Secondary | ICD-10-CM

## 2019-11-22 DIAGNOSIS — H9193 Unspecified hearing loss, bilateral: Secondary | ICD-10-CM | POA: Diagnosis not present

## 2019-11-22 DIAGNOSIS — F5104 Psychophysiologic insomnia: Secondary | ICD-10-CM | POA: Diagnosis not present

## 2019-11-22 DIAGNOSIS — J45909 Unspecified asthma, uncomplicated: Secondary | ICD-10-CM | POA: Diagnosis not present

## 2019-11-22 DIAGNOSIS — F331 Major depressive disorder, recurrent, moderate: Secondary | ICD-10-CM

## 2019-11-22 DIAGNOSIS — R479 Unspecified speech disturbances: Secondary | ICD-10-CM

## 2019-11-22 DIAGNOSIS — F900 Attention-deficit hyperactivity disorder, predominantly inattentive type: Secondary | ICD-10-CM | POA: Diagnosis not present

## 2019-11-22 DIAGNOSIS — R140 Abdominal distension (gaseous): Secondary | ICD-10-CM | POA: Diagnosis not present

## 2019-11-22 DIAGNOSIS — K219 Gastro-esophageal reflux disease without esophagitis: Secondary | ICD-10-CM | POA: Insufficient documentation

## 2019-11-22 DIAGNOSIS — F419 Anxiety disorder, unspecified: Secondary | ICD-10-CM

## 2019-11-22 DIAGNOSIS — E669 Obesity, unspecified: Secondary | ICD-10-CM | POA: Diagnosis not present

## 2019-11-22 DIAGNOSIS — D229 Melanocytic nevi, unspecified: Secondary | ICD-10-CM

## 2019-11-22 HISTORY — DX: Disorder of pigmentation, unspecified: L81.9

## 2019-11-22 HISTORY — DX: Melanocytic nevi, unspecified: D22.9

## 2019-11-22 HISTORY — DX: Personal history of nicotine dependence: Z87.891

## 2019-11-22 MED ORDER — HYDROXYZINE HCL 10 MG PO TABS: 10.0000 mg | ORAL_TABLET | Freq: Every evening | ORAL | 0 refills | Status: DC | PRN

## 2019-11-22 MED ORDER — ALBUTEROL SULFATE HFA 108 (90 BASE) MCG/ACT IN AERS: 1.0000 | INHALATION_SPRAY | RESPIRATORY_TRACT | 1 refills | Status: DC | PRN

## 2019-11-22 MED ORDER — PROBIOTIC 250 MG PO CAPS: 1.0000 | ORAL_CAPSULE | Freq: Every day | ORAL | 1 refills | Status: DC

## 2019-11-22 MED ORDER — RIZATRIPTAN BENZOATE 5 MG PO TABS: 5.0000 mg | ORAL_TABLET | ORAL | 0 refills | Status: DC | PRN

## 2019-11-22 NOTE — Assessment & Plan Note (Signed)
Is treated by Dr Harrington Challenger, reports medication is helping but not enough. phq elevated, thinks it is more anxiety than depression Denies SI and HI. Advised her to call Dr Harrington Challenger and see about getting a sooner appt.

## 2019-11-22 NOTE — Assessment & Plan Note (Signed)
Being treated by Dr Harrington Challenger

## 2019-11-22 NOTE — Assessment & Plan Note (Signed)
Smoked from 24 years of age until 40.

## 2019-11-22 NOTE — Assessment & Plan Note (Signed)
High GAD No SI or HI Starting low dose atarax to help her calm down before bed. She reports feelings of irritability more now than depression Is followed by Dr Harrington Challenger, I have encouraged her to reach out about he symptoms.

## 2019-11-22 NOTE — Assessment & Plan Note (Signed)
Needs inhaler. Reports shortness of breath with activity.

## 2019-11-22 NOTE — Patient Instructions (Addendum)
Happy New Year! May you have a year filled with hope, love, happiness and laughter.  I appreciate the opportunity to provide you with care for your health and wellness. Today we discussed: established care  Follow up: 4 weeks  No labs or referrals today  Referrals to allergist for food testing, dermatologist for skin cancer mole check  Take medications as directed, if you have trouble with them let me know.  Maxalt is for headaches take at the onset of the headache.  Atarax is for bedtime to help you relax and go to sleep.  Probiotic is to help your belly feel better.  Please continue to practice social distancing to keep you, your family, and our community safe.  If you must go out, please wear a mask and practice good handwashing.  It was a pleasure to see you and I look forward to continuing to work together on your health and well-being. Please do not hesitate to call the office if you need care or have questions about your care.  Have a wonderful day and week. With Gratitude, Cherly Beach, DNP, AGNP-BC

## 2019-11-22 NOTE — Assessment & Plan Note (Addendum)
Reports high anxiety- GAD elevated will give her atarax to take before bed. Hopefully some sleep will improve her mood and reduce her anxiety.  Reviewed side effects, risks and benefits of medication.   Patient acknowledged agreement and understanding of the plan.

## 2019-11-22 NOTE — Progress Notes (Signed)
Subjective:  Patient ID: Lacey Hartman, female    DOB: 1996-10-10  Age: 24 y.o. MRN: [id number]  CC:  Chief Complaint  Patient presents with  . New Patient (Initial Visit)    establish care      HPI  HPI  Lacey Hartman is a 24 year old female patient who presents today to establish care.  Was being seen at Dr. Yehuda Mao office.  And by Dr. Sumner Boast.  She has a extensive history including but not limited to ADHD, anxiety, asthma, depression, hard of hearing hearing loss,-ex-smoker. Strong family history of anxiety, bipolar, cancer, ADHD.  A former smoker that smoked from age 26 till about 29 per her.  Does not use alcohol does not use illicit drugs.  Currently sexually active has implant.  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  Right has a hearing aid.  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  Lives with fianc and 3 children.  She has 2 children by hand and one from a previous relationship that he has Ray since he was 40 months old.  She enjoy spending time with her family.  Prior to Covid she like going to the beach.  She reports she does not have a good diet she eats a lot of junk she says and then when she is full she stops.  She does not eat a lot of veggies or fruits.  She just eats mostly snack foods.  Drinks about 15 sodas or so a day.  And only about 3 to 5 cups of water.  Today she reports that she would like to get help with getting a new inhaler because she gets short of breath going around with her children.  She denies having any cough fevers chills or allergies.  History of asthma but currently does not have a rescue inhaler.  She says that her previous provider stopped giving it to her.  She also reports that she has some significant abdominal pain and discomfort that she reports she was told that she had testing before but does not know what the testing was for but she does  know she eats certain foods that gives her trouble.  She is never done elimination diet.  She denies having any changes in bowel movements or blood in urine or stool.  She denies having any back pain.  She does reports that her stomach swells really really bad after eating and she cannot identify what foods cause it.  Additionally she reports that she has a mole on her chest that someone told her was cancer but they did not take a sample of it.  She has never been to a dermatologist at this time.  But is willing to go to want to make sure that she does not need to have it removed.  She reports that it is dark color and it used to not be.  She also gets pretty significant headaches.  She described them as cluster headaches she is seen by somebody before for this.  Is open to seeing a neurologist again.  And or trying to get some medication if she can to help it.  She reports she does not sleep very well because she gets anxious and she worries about a lot of stuff.  She is very anxious with worrying about her family as it is a lot of stuff that she cannot control with her  immediate family.  The family that she is marrying into is very supportive though so she does have a good network.  She is seen by Dr. Harrington Challenger who prescribes for her Concerta and Prozac.  She reports that she is just not able to sleep so unfortunately.  She is not tried anything over-the-counter for this.  Had a recent Pap smear in October of last year.  Everything was clear per her.  Today patient denies signs and symptoms of COVID 19 infection including fever, chills, cough, shortness of breath, and headache. Past Medical, Surgical, Social History, Allergies, and Medications have been Reviewed.   Past Medical History:  Diagnosis Date  . ADHD (attention deficit hyperactivity disorder)   . Anxiety   . Asthma    inhaler prn  . Depression   . Hard of hearing   . Right conjunctivitis 08/10/2018  . Smoker 12/03/2013   1-2ppd prior to  pregnancy, down to 2cigs/day trying to quit                       01/01/14: 4cigs/day   [facility]     Current Meds  Medication Sig  . etonogestrel (NEXPLANON) 68 MG IMPL implant 1 each by Subdermal route once.  Marland Kitchen FLUoxetine (PROZAC) 40 MG capsule Take 1 capsule (40 mg total) by mouth daily.  . methylphenidate (CONCERTA) 36 MG PO CR tablet Take 1 tablet (36 mg total) by mouth daily.    ROS:  Review of Systems  Constitutional: Negative.   HENT: Negative.        Hearing impairment-congential hearing aid on Right  Eyes: Negative.   Respiratory: Negative.   Cardiovascular: Negative.   Gastrointestinal: Positive for abdominal pain.       Bloating and swelling of belly post eating   Genitourinary: Negative.   Musculoskeletal: Negative.   Skin: Negative.        Discolored mole   Neurological: Negative.   Endo/Heme/Allergies: Negative.   Psychiatric/Behavioral: Positive for depression. The patient is nervous/anxious and has insomnia.   All other systems reviewed and are negative.    Objective:   Today's Vitals: BP 104/70   Pulse 90   Temp (!) 96.2 F (35.7 C) (Temporal)   Resp 15   Ht 5' (1.524 m)   Wt 165 lb 1.9 oz (74.9 kg)   SpO2 97%   BMI 32.25 kg/m  Vitals with BMI 11/22/2019 07/20/2019 05/16/2019  Height 5\' 0"  5\' 2"  5\' 0"   Weight 165 lbs 2 oz 163 lbs 162 lbs 10 oz  BMI 32.25 123XX123 [id number]  Systolic 123456 123456 -  Diastolic 70 84 -  Pulse 90 - -  Some encounter information is confidential and restricted. Go to Review Flowsheets activity to see all data.     Physical Exam Vitals and nursing note reviewed.  Constitutional:      Appearance: Normal appearance. She is well-developed and well-groomed. She is obese.  HENT:     Head: Normocephalic and atraumatic.     Comments: Mask in place     Right Ear: External ear normal. Decreased hearing noted.     Left Ear: External ear normal. Decreased hearing noted.     Ears:     Comments: Hearing aid on right  Noted to  have speech impairment secondary to this Eyes:     General:        Right eye: No discharge.        Left eye: No discharge.  Conjunctiva/sclera: Conjunctivae normal.  Cardiovascular:     Rate and Rhythm: Normal rate and regular rhythm.     Pulses: Normal pulses.     Heart sounds: Normal heart sounds.  Pulmonary:     Effort: Pulmonary effort is normal.     Breath sounds: Normal breath sounds.  Musculoskeletal:        General: Normal range of motion.     Cervical back: Normal range of motion and neck supple.  Skin:    General: Skin is warm.     Comments: Dark colored mole on chest left side.   Neurological:     General: No focal deficit present.     Mental Status: She is alert and oriented to person, place, and time.  Psychiatric:        Attention and Perception: Attention normal.        Mood and Affect: Mood normal.        Speech: Speech normal.        Behavior: Behavior normal. Behavior is cooperative.        Thought Content: Thought content normal.        Cognition and Memory: Cognition normal.        Judgment: Judgment normal.     Depression screen [facility] 2/9 11/22/2019 07/20/2019 02/02/2017  Decreased Interest 0 1 1  Down, Depressed, Hopeless 1 0 2  PHQ - 2 Score 1 1 3   Altered sleeping 3 0 1  Tired, decreased energy 1 1 2   Change in appetite 3 1 1   Feeling bad or failure about yourself  1 0 0  Trouble concentrating 1 1 1   Moving slowly or fidgety/restless 0 0 0  Suicidal thoughts 0 0 0  PHQ-9 Score 10 4 8   Difficult doing work/chores Very difficult Somewhat difficult Somewhat difficult   GAD 7 : Generalized Anxiety Score 11/22/2019  Nervous, Anxious, on Edge 2  Control/stop worrying 2  Worry too much - different things 3  Trouble relaxing 2  Restless 0  Easily annoyed or irritable 3  Afraid - awful might happen 2  Total GAD 7 Score 14  Anxiety Difficulty Very difficult    Assessment   1. Moderate episode of recurrent major depressive disorder ([place])   2.  Attention deficit hyperactivity disorder (ADHD), predominantly inattentive type   3. Change in color of skin mole   4. Mild asthma, unspecified whether complicated, unspecified whether persistent   5. Bloated abdomen   6. Anxiety   7. Psychophysiological insomnia   8. Cluster headache, not intractable, unspecified chronicity pattern   9. Obesity (BMI 30-39.9)   10. 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 Speech impediment     Tests ordered Orders Placed This Encounter  Procedures  . Ambulatory referral to Dermatology  . Ambulatory referral to Allergy     Plan: Please see assessment and plan per problem list above.   Meds ordered this encounter  Medications  . rizatriptan (MAXALT) 5 MG tablet    Sig: Take 1 tablet (5 mg total) by mouth as needed for migraine. May repeat in 2 hours if needed    Dispense:  10 tablet    Refill:  0    Order Specific Question:   Supervising Provider    Answer:   SIMPSON, MARGARET E [[id number]]  . hydrOXYzine (ATARAX/VISTARIL) 10 MG tablet    Sig: Take 1 tablet (10 mg total) by mouth at bedtime as needed for anxiety.    Dispense:  30 tablet    Refill:  0    Order Specific Question:   Supervising Provider    Answer:   SIMPSON, MARGARET E [id number]  . albuterol (VENTOLIN HFA) 108 (90 Base) MCG/ACT inhaler    Sig: Inhale 1-2 puffs into the lungs every 4 (four) hours as needed for wheezing or shortness of breath.    Dispense:  18 g    Refill:  1    Order Specific Question:   Supervising Provider    Answer:   SIMPSON, MARGARET E [id number]  . Saccharomyces boulardii (PROBIOTIC) 250 MG CAPS    Sig: Take 1 capsule by mouth at bedtime.    Dispense:  30 capsule    Refill:  1    Order Specific Question:   Supervising Provider    Answer:   Fayrene Helper [id number]    Patient to follow-up in 4 weeks .  Perlie Mayo, NP

## 2019-11-22 NOTE — Assessment & Plan Note (Signed)
Starting probiotics, reports testing for issues but can not recall what. Unsure of food sensitivities send to allergist to look at testing.  Unsure if she would do well with trying to elimitation diet, but we did discuss this

## 2019-11-22 NOTE — Assessment & Plan Note (Signed)
Specifically states she was dx with cluster headaches previously tried a medication that she reports "made me crazy" she does not know the name. I have ordered maxalt in hopes to reduce the duration of these headaches. Might need referral to neurology if this does not work. I am concerned about starting a daily med at this time.  Reviewed side effects, risks and benefits of medication.   Patient acknowledged agreement and understanding of the plan.

## 2019-11-22 NOTE — Assessment & Plan Note (Signed)
Referral to Derm to check for possible skin cancer in mole located on left breast. She reports being told it was cancer, but no one bx it per her.

## 2019-11-22 NOTE — Assessment & Plan Note (Signed)
  Lacey Hartman is educated about the importance of exercise daily to help with weight management. A minumum of 30 minutes daily is recommended. Additionally, importance of healthy food choices  with portion control discussed.   Wt Readings from Last 3 Encounters:  11/22/19 165 lb 1.9 oz (74.9 kg)  07/20/19 163 lb (73.9 kg)  05/16/19 162 lb 9.6 oz (73.8 kg)

## 2019-11-25 ENCOUNTER — Encounter: Payer: Self-pay | Admitting: Family Medicine

## 2019-11-25 DIAGNOSIS — R479 Unspecified speech disturbances: Secondary | ICD-10-CM

## 2019-11-25 DIAGNOSIS — H9193 Unspecified hearing loss, bilateral: Secondary | ICD-10-CM | POA: Insufficient documentation

## 2019-11-25 HISTORY — DX: Unspecified speech disturbances: R47.9

## 2019-11-25 NOTE — Assessment & Plan Note (Signed)
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 Left is complete, Right had enough to get a hearing aid to help. Reports doing well with this now.

## 2019-11-25 NOTE — Assessment & Plan Note (Signed)
Secondary to being born with limited hearing.  Wears hearing aid and did therapy when she was young.

## 2019-12-04 DIAGNOSIS — D225 Melanocytic nevi of trunk: Secondary | ICD-10-CM | POA: Diagnosis not present

## 2019-12-04 DIAGNOSIS — D485 Neoplasm of uncertain behavior of skin: Secondary | ICD-10-CM | POA: Diagnosis not present

## 2019-12-04 DIAGNOSIS — D229 Melanocytic nevi, unspecified: Secondary | ICD-10-CM | POA: Diagnosis not present

## 2019-12-20 ENCOUNTER — Encounter: Payer: Self-pay | Admitting: Family Medicine

## 2019-12-20 ENCOUNTER — Ambulatory Visit (INDEPENDENT_AMBULATORY_CARE_PROVIDER_SITE_OTHER): Payer: Medicaid Other | Admitting: Family Medicine

## 2019-12-20 ENCOUNTER — Other Ambulatory Visit: Payer: Self-pay

## 2019-12-20 VITALS — BP 114/70 | HR 97 | Temp 97.7°F | Resp 15 | Ht 60.0 in | Wt 167.0 lb

## 2019-12-20 DIAGNOSIS — M79622 Pain in left upper arm: Secondary | ICD-10-CM

## 2019-12-20 DIAGNOSIS — Z803 Family history of malignant neoplasm of breast: Secondary | ICD-10-CM

## 2019-12-20 DIAGNOSIS — N644 Mastodynia: Secondary | ICD-10-CM

## 2019-12-20 DIAGNOSIS — N6314 Unspecified lump in the right breast, lower inner quadrant: Secondary | ICD-10-CM | POA: Diagnosis not present

## 2019-12-20 NOTE — Progress Notes (Signed)
Subjective:  Patient ID: Lacey Hartman, female    DOB: [birth year]  Age: 24 y.o. MRN: [id number]  CC:  Chief Complaint  Patient presents with  . Breast Mass      HPI  HPI  Lacey Hartman presents today for follow-up regarding the fact that she found breast mass on her left. She reports that she has noticed lumps off and on for about a year.  She reports that she has been noticing here recently in the last week or so that they have increased.  And wanted to be seen as she has a family history of breast cancer.  Lump noticed in the left breast.  No pain, reports that she noticed a new wound which is what prompted her to get seen.  Not very much size change.  Denies nipple discharge.  Denies skin changes.  Denies having any nausea, vomiting, bleeding, fatigue. Reports that she did have a lump under her arm about 6 months ago it lasted for about 3 months and that it went away.  She reports that where it was she still feels tenderness.  Family history she reports that both of her grandmothers had breast cancer.  Her maternal grandmother was diagnosed at 26 years of age.  And to had to have a bilateral mastectomy ended up having mets to her lung and colon as well. Her paternal grandmother she reports was diagnosed around the age of 38.  And her treatment did very well.   Of note she was unable to get the probiotic covered by insurance.  Dermatology was able to remove her mole and it was sent off for pathology came back normal.  Today patient denies signs and symptoms of COVID 19 infection including fever, chills, cough, shortness of breath, and headache. Past Medical, Surgical, Social History, Allergies, and Medications have been Reviewed.   Past Medical History:  Diagnosis Date  . ADHD (attention deficit hyperactivity disorder)   . Anxiety   . Asthma    inhaler prn  . Change in color of skin mole 11/22/2019  . Depression   . Ex-cigarette smoker 11/22/2019  . Hard of hearing   .  Right conjunctivitis 08/10/2018  . Smoker 12/03/2013   1-2ppd prior to pregnancy, down to 2cigs/day trying to quit                       01/01/14: 4cigs/day   [facility]   . 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    Current Meds  Medication Sig  . albuterol (VENTOLIN HFA) 108 (90 Base) MCG/ACT inhaler Inhale 1-2 puffs into the lungs every 4 (four) hours as needed for wheezing or shortness of breath.  . etonogestrel (NEXPLANON) 68 MG IMPL implant 1 each by Subdermal route once.  Marland Kitchen FLUoxetine (PROZAC) 40 MG capsule Take 1 capsule (40 mg total) by mouth daily.  . hydrOXYzine (ATARAX/VISTARIL) 10 MG tablet Take 1 tablet (10 mg total) by mouth at bedtime as needed for anxiety.  . methylphenidate (CONCERTA) 36 MG PO CR tablet Take 1 tablet (36 mg total) by mouth daily.  . rizatriptan (MAXALT) 5 MG tablet Take 1 tablet (5 mg total) by mouth as needed for migraine. May repeat in 2 hours if needed  . Saccharomyces boulardii (PROBIOTIC) 250 MG CAPS Take 1 capsule by mouth at bedtime.    ROS:  Review of Systems  Constitutional:       See HPI  HENT: Negative.   Eyes: Negative.  Respiratory: Negative.   Cardiovascular: Negative.   Gastrointestinal: Negative.   Genitourinary: Negative.   Musculoskeletal: Negative.   Skin: Negative.   Neurological: Negative.   Endo/Heme/Allergies: Negative.   Psychiatric/Behavioral: Negative.   All other systems reviewed and are negative.    Objective:   Today's Vitals: BP 114/70   Pulse 97   Temp 97.7 F (36.5 C) (Temporal)   Resp 15   Ht 5' (1.524 m)   Wt 167 lb (75.8 kg)   SpO2 99%   BMI 32.61 kg/m  Vitals with BMI 12/21/2019 12/20/2019 11/22/2019  Height 5\' 3"  5\' 0"  5\' 0"   Weight 169 lbs 167 lbs 165 lbs 2 oz  BMI 29.94 0000000 [id number]  Systolic 123XX123 [id number]  Diastolic 70 70 70  Pulse 86 97 90  Some encounter information is confidential and restricted. Go to Review Flowsheets activity to see all data.     Physical Exam Vitals and nursing note  reviewed.  Constitutional:      Appearance: Normal appearance. She is well-developed, well-groomed and overweight.  HENT:     Head: Normocephalic and atraumatic.     Right Ear: External ear normal.     Left Ear: External ear normal.     Nose: Nose normal.     Mouth/Throat:     Mouth: Mucous membranes are moist.     Pharynx: Oropharynx is clear.  Eyes:     General:        Right eye: No discharge.        Left eye: No discharge.     Conjunctiva/sclera: Conjunctivae normal.  Cardiovascular:     Rate and Rhythm: Normal rate and regular rhythm.     Pulses: Normal pulses.     Heart sounds: Normal heart sounds.  Pulmonary:     Effort: Pulmonary effort is normal.     Breath sounds: Normal breath sounds.  Chest:     Breasts:        Right: Normal.        Left: Mass and tenderness present.    Musculoskeletal:        General: Normal range of motion.     Cervical back: Normal range of motion and neck supple.  Lymphadenopathy:     Upper Body:     Right upper body: No supraclavicular, axillary or pectoral adenopathy.     Left upper body: No supraclavicular, axillary or pectoral adenopathy.  Skin:    General: Skin is warm.  Neurological:     General: No focal deficit present.     Mental Status: She is alert and oriented to person, place, and time.  Psychiatric:        Attention and Perception: Attention normal.        Mood and Affect: Mood normal.        Speech: Speech normal.        Behavior: Behavior normal. Behavior is cooperative.        Thought Content: Thought content normal.        Cognition and Memory: Cognition normal.        Judgment: Judgment normal.     Assessment   1. Lump in lower inner quadrant of right breast   2. Breast tenderness in female   3. Axillary tenderness, left   4. Family history of breast cancer     Tests ordered Orders Placed This Encounter  Procedures  . US BREAST LTD UNI LEFT INC AXILLA     Plan: Please see assessment  and plan per  problem list above.   No orders of the defined types were placed in this encounter.   Patient to follow-up in as scheduled.  Perlie Mayo, NP

## 2019-12-20 NOTE — Patient Instructions (Signed)
I appreciate the opportunity to provide you with care for your health and wellness. Today we discussed: lump and breast tenderness  Follow up: as needed  [place] ordered today for breast Pending findings we will refer if needed.  Hopefully, the allergist can get your bloating/food reactions identified.  Please continue to practice social distancing to keep you, your family, and our community safe.  If you must go out, please wear a mask and practice good handwashing.  It was a pleasure to see you and I look forward to continuing to work together on your health and well-being. Please do not hesitate to call the office if you need care or have questions about your care.  Have a wonderful day and week. With Gratitude, Cherly Beach, DNP, AGNP-BC

## 2019-12-21 ENCOUNTER — Encounter: Payer: Self-pay | Admitting: Allergy & Immunology

## 2019-12-21 ENCOUNTER — Encounter: Payer: Self-pay | Admitting: Family Medicine

## 2019-12-21 ENCOUNTER — Ambulatory Visit: Payer: Medicaid Other | Admitting: Allergy & Immunology

## 2019-12-21 VITALS — BP 108/70 | HR 86 | Temp 98.6°F | Resp 18 | Ht 63.0 in | Wt 169.0 lb

## 2019-12-21 DIAGNOSIS — K9049 Malabsorption due to intolerance, not elsewhere classified: Secondary | ICD-10-CM

## 2019-12-21 DIAGNOSIS — J4520 Mild intermittent asthma, uncomplicated: Secondary | ICD-10-CM | POA: Diagnosis not present

## 2019-12-21 MED ORDER — OMEPRAZOLE 20 MG PO CPDR: DELAYED_RELEASE_CAPSULE | ORAL | 5 refills | Status: DC

## 2019-12-21 NOTE — Assessment & Plan Note (Signed)
Appears to have a strong history of breast cancer, ultrasound ordered for assessment in better detail.  Limited noted finding probably cystic related.  She reports that she can feel it when I palpate the area more so than I can feel the actual spot.  Hopefully ultrasound can clear any worrisome concerns.

## 2019-12-21 NOTE — Patient Instructions (Addendum)
1. Mild intermittent asthma, uncomplicated - Lung testing was normal today. - Continue with albuterol 2 puffs every 4-6 hours as needed.  - There does not seem to be a need for a controller medication.   2. Food intolerance - I do not think that your symptoms are related to food allergies at all. - I would recommend seeing GI instead to start the workup process.  - Start omeprazole 20mg  at night to see if this helps at all.  3. Return if symptoms worsen or fail to improve. This can be an in-person, a virtual Webex or a telephone follow up visit.   Please inform us of any Emergency Department visits, hospitalizations, or changes in symptoms. Call us before going to the ED for breathing or allergy symptoms since we might be able to fit you in for a sick visit. Feel free to contact us anytime with any questions, problems, or concerns.  It was a pleasure to meet you today!  Websites that have reliable patient information: 1. American Academy of Asthma, Allergy, and Immunology: www.aaaai.org 2. Food Allergy Research and Education (FARE): foodallergy.org 3. Mothers of Asthmatics: http://www.asthmacommunitynetwork.org 4. American College of Allergy, Asthma, and Immunology: www.acaai.org   COVID-19 Vaccine Information can be found at: ShippingScam.co.uk For questions related to vaccine distribution or appointments, please email [email or web address] or call [phone].     "Like" [place] on Facebook and Instagram for our latest updates!        Make sure you are registered to vote! If you have moved or changed any of your contact information, you will need to get this updated before voting!  In some cases, you MAY be able to register to vote online: [email or web address]

## 2019-12-21 NOTE — Progress Notes (Signed)
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  Her reactions are not consistent with any particular food and they have no itching, hives, breathing problems, or oropharyngeal symptoms that would be more concerning for an IgE mediated process.  It seems from the history that she likely just has some abdominal muscle deconditioning secondary to having 3 children, which results in diastases of her musculature and a sensation of bloating.  However, I would feel more comfortable if she saw gastroenterology.  I did not think testing would be useful at this time, but we can certainly revisit that in the future if the GI referral is not helpful.  We did start a PPI empirically to see if this would provide any relief, which would be helpful when she sees gastroenterology.  She is in agreement with the plan.  We are going to continue with her albuterol.  There is no need for a controller medication and her spirometry looks great.  Plan/Recommendations:   1. Mild intermittent asthma, uncomplicated - Lung testing was normal today. - Continue with albuterol 2 puffs every 4-6 hours as needed.  - There does not seem to be a need for a controller medication.   2. Food intolerance - I do not think that your symptoms are related to food allergies at all. - I would recommend seeing GI instead to start the workup process.  - Start omeprazole 20mg  at night to see if this helps at all.  3. Return if symptoms worsen or fail to improve. This can be an in-person, a virtual Webex or a telephone follow up visit.  Subjective:   Lacey Hartman is a 24 y.o. female presenting today for evaluation of  Chief Complaint  Patient presents with  . Abdominal Swelling    Four years. Worse in  last 7 months. After eating then subsides overnight.    Lacey Hartman has a history of the following: Patient Active Problem List   Diagnosis Date Noted  . Axillary tenderness, left 12/20/2019  . Breast tenderness in female 12/20/2019  . Lump in lower inner quadrant of right breast 12/20/2019  . Family history of breast cancer 12/20/2019  . 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 Bloated abdomen 11/22/2019  . Cluster headache, not intractable 11/22/2019  . Psychophysiological insomnia 11/22/2019  . Anxiety 11/22/2019  . Mild asthma 11/22/2019  . Obesity (BMI 30-39.9) 11/22/2019  . Depression 06/10/2016  . ADD (attention deficit disorder) 12/24/2015    History obtained from: chart review and patient.  Lacey Hartman was referred by Perlie Mayo, NP.     Lacey Hartman is a 24 y.o. female presenting for an evaluation of abdominal bloating.  She has a history of chronic abdominal pain and bloating which gets worse throughout the day.  Tends to improve overnight but then returns after eating breakfast. It does become more painful. She reports that she looks like she is about "8 months pregnant". This is ketchup, pizza, tomato, soft serve, and other items like that. She was told that she had Crohn's disease versus IBS. Her current doctor feels like this might be allergic. Symptoms occur throughout the entire year. This all started around hte time that she had her son, who is now 46 years old. Symptoms have become more severe over  the past 7 months. She does report some GERD symptoms. She has never been on any GI medications. She has never had diarrhea with this.   She has never seen GI and has had no endoscopy or colonoscopy. She has never taken GI medications. She actually was going to see GI, but wanted to see Allergy first.     Asthma/Respiratory Symptom History: She just recently got an albuterol inhaler two months ago. When she was younger, she had worse asthma but overall it has become less  severe over time. She has not required any prednisone or emergency room visits.  She uses less than 1 albuterol inhaler per year.  She reports good nighttime sleeping.  She has a history of ADHD as well as anxiety and depression.  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  She developed a speech impediment secondary to this.   Otherwise, there is no history of other atopic diseases, including drug allergies, environmental allergies, stinging insect allergies, eczema, urticaria or contact dermatitis. There is no significant infectious history. Vaccinations are up to date.    Past Medical History: Patient Active Problem List   Diagnosis Date Noted  . Axillary tenderness, left 12/20/2019  . Breast tenderness in female 12/20/2019  . Lump in lower inner quadrant of right breast 12/20/2019  . Family history of breast cancer 12/20/2019  . 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 Bloated abdomen 11/22/2019  . Cluster headache, not intractable 11/22/2019  . Psychophysiological insomnia 11/22/2019  . Anxiety 11/22/2019  . Mild asthma 11/22/2019  . Obesity (BMI 30-39.9) 11/22/2019  . Depression 06/10/2016  . ADD (attention deficit disorder) 12/24/2015    Medication List:  Allergies as of 12/21/2019      Reactions   Codeine Hives, Swelling, Rash   & hallucinations/      Medication List       Accurate as of December 21, 2019  2:44 PM. If you have any questions, ask your nurse or doctor.        albuterol 108 (90 Base) MCG/ACT inhaler Commonly known as: VENTOLIN HFA Inhale 1-2 puffs into the lungs every 4 (four) hours as needed for wheezing or shortness of breath.   FLUoxetine 40 MG capsule Commonly known as: PROZAC Take 1 capsule (40 mg total) by mouth daily.   hydrOXYzine 10 MG tablet Commonly known as: ATARAX/VISTARIL Take 1 tablet (10 mg total) by mouth at bedtime as needed for anxiety.   methylphenidate 36 MG CR  tablet Commonly known as: Concerta Take 1 tablet (36 mg total) by mouth daily.   Nexplanon 68 MG Impl implant Generic drug: etonogestrel 1 each by Subdermal route once.   Probiotic 250 MG Caps Take 1 capsule by mouth at bedtime.   rizatriptan 5 MG tablet Commonly known as: Maxalt Take 1 tablet (5 mg total) by mouth as needed for migraine. May repeat in 2 hours if needed       Birth History: non-contributory  Developmental History: non-contributory  Past Surgical History: Past Surgical History:  Procedure Laterality Date  . NO PAST SURGERIES       Family History: Family History  Problem Relation Age of Onset  . Depression Mother   . Anxiety disorder Mother   . Bipolar disorder Mother   . Asthma Mother   . Cancer Maternal Grandmother   . Cancer Maternal Uncle   . ADD / ADHD Brother   .  ADD / ADHD Brother   . Asthma Brother   . Allergic rhinitis Brother   . Cancer Paternal Grandmother   . Angioedema Neg Hx   . Atopy Neg Hx   . Eczema Neg Hx   . Immunodeficiency Neg Hx   . Urticaria Neg Hx      Social History: Allessandra lives at home with her fianc and their 3 combined kids. They live in a house that is [address] years old. There is carpeting in the main living areas    Review of Systems  Constitutional: Negative.  Negative for chills, fever, malaise/fatigue and weight loss.  HENT: Negative.  Negative for congestion, ear discharge and ear pain.   Eyes: Negative for pain, discharge and redness.  Respiratory: Negative for cough, sputum production, shortness of breath and wheezing.   Cardiovascular: Negative.  Negative for chest pain and palpitations.  Gastrointestinal: Positive for abdominal pain, heartburn and nausea. Negative for constipation, diarrhea and vomiting.  Skin: Negative.  Negative for itching and rash.  Neurological: Negative for dizziness and headaches.  Endo/Heme/Allergies: Negative for environmental allergies. Does not bruise/bleed easily.        Objective:   Blood pressure 108/70, pulse 86, temperature 98.6 F (37 C), temperature source Temporal, resp. rate 18, height 5\' 3"  (1.6 m), weight 169 lb (76.7 kg), SpO2 99 %, unknown if currently breastfeeding. Body mass index is 29.94 kg/m.   Physical Exam:   Physical Exam  Constitutional: She appears well-developed.  Pleasant female.  She is talkative and shares her quite a bit about her history.  HENT:  Head: Normocephalic and atraumatic.  Right Ear: Tympanic membrane, external ear and ear canal normal. No drainage, swelling or tenderness. Tympanic membrane is not injected, not scarred, not erythematous, not retracted and not bulging.  Left Ear: Tympanic membrane, external ear and ear canal normal. No drainage, swelling or tenderness. Tympanic membrane is not injected, not scarred, not erythematous, not retracted and not bulging.  Nose: Mucosal edema and rhinorrhea present. No nasal deformity or septal deviation. No epistaxis. Right sinus exhibits no maxillary sinus tenderness and no frontal sinus tenderness. Left sinus exhibits no maxillary sinus tenderness and no frontal sinus tenderness.  Mouth/Throat: Uvula is midline and oropharynx is clear and moist. Mucous membranes are not pale and not dry.  Eyes: Pupils are equal, round, and reactive to light. Conjunctivae and EOM are normal. Right eye exhibits no chemosis and no discharge. Left eye exhibits no chemosis and no discharge. Right conjunctiva is not injected. Left conjunctiva is not injected.  Cardiovascular: Normal rate, regular rhythm and normal heart sounds.  Respiratory: Effort normal and breath sounds normal. No accessory muscle usage. No tachypnea. No respiratory distress. She has no wheezes. She has no rhonchi. She has no rales. She exhibits no tenderness.  Moving air well in all lung fields.  GI: There is no abdominal tenderness. There is no rebound and no guarding.  Soft.  Nontender.  Mildly distended.  No masses.   Lymphadenopathy:       Head (right side): No submandibular, no tonsillar and no occipital adenopathy present.       Head (left side): No submandibular, no tonsillar and no occipital adenopathy present.    She has no cervical adenopathy.  Neurological: She is alert.  Skin: No abrasion, no petechiae and no rash noted. Rash is not papular, not vesicular and not urticarial. No erythema. No pallor.  Psychiatric: She has a normal mood and affect.     Diagnostic studies:  Spirometry: results normal (FEV1: 2.71/86%, FVC: 3.23/88%, FEV1/FVC: 84%).    Spirometry consistent with normal pattern.   Allergy Studies: none           Salvatore Marvel, MD [facility]

## 2019-12-21 NOTE — Assessment & Plan Note (Signed)
Ultrasound ordered

## 2019-12-21 NOTE — Assessment & Plan Note (Signed)
Ultrasound ordered, counseling on hormonal impacts of breast tissue

## 2019-12-25 ENCOUNTER — Other Ambulatory Visit (HOSPITAL_COMMUNITY): Payer: Self-pay | Admitting: Family Medicine

## 2019-12-25 DIAGNOSIS — N63 Unspecified lump in unspecified breast: Secondary | ICD-10-CM

## 2019-12-26 ENCOUNTER — Telehealth: Payer: Self-pay

## 2019-12-26 NOTE — Telephone Encounter (Signed)
[place] Pre cert  Needs Pre Cert for Ultrasound

## 2019-12-26 NOTE — Telephone Encounter (Signed)
Precert completed

## 2020-01-01 ENCOUNTER — Ambulatory Visit (HOSPITAL_COMMUNITY): Payer: Medicaid Other

## 2020-01-01 ENCOUNTER — Other Ambulatory Visit (HOSPITAL_COMMUNITY): Payer: Medicaid Other

## 2020-01-08 ENCOUNTER — Ambulatory Visit (HOSPITAL_COMMUNITY)
Admission: RE | Admit: 2020-01-08 | Discharge: 2020-01-08 | Disposition: A | Discharge status: Home / Self Care | Payer: Medicaid Other | Source: Ambulatory Visit | Attending: Family Medicine | Admitting: Family Medicine

## 2020-01-08 ENCOUNTER — Other Ambulatory Visit: Payer: Self-pay

## 2020-01-08 DIAGNOSIS — N6320 Unspecified lump in the left breast, unspecified quadrant: Secondary | ICD-10-CM | POA: Insufficient documentation

## 2020-01-08 DIAGNOSIS — N63 Unspecified lump in unspecified breast: Secondary | ICD-10-CM

## 2020-01-08 DIAGNOSIS — N6489 Other specified disorders of breast: Secondary | ICD-10-CM | POA: Diagnosis not present

## 2020-03-09 ENCOUNTER — Other Ambulatory Visit (HOSPITAL_COMMUNITY): Payer: Self-pay | Admitting: Psychiatry

## 2020-03-11 ENCOUNTER — Telehealth (INDEPENDENT_AMBULATORY_CARE_PROVIDER_SITE_OTHER): Payer: Medicaid Other | Admitting: Family Medicine

## 2020-03-11 ENCOUNTER — Encounter: Payer: Self-pay | Admitting: Family Medicine

## 2020-03-11 ENCOUNTER — Other Ambulatory Visit: Payer: Self-pay

## 2020-03-11 VITALS — BP 108/70 | Ht 63.0 in | Wt 169.0 lb

## 2020-03-11 DIAGNOSIS — K219 Gastro-esophageal reflux disease without esophagitis: Secondary | ICD-10-CM | POA: Diagnosis not present

## 2020-03-11 MED ORDER — OMEPRAZOLE 20 MG PO CPDR: DELAYED_RELEASE_CAPSULE | ORAL | 1 refills | Status: DC

## 2020-03-11 NOTE — Patient Instructions (Signed)
I appreciate the opportunity to provide you with care for your health and wellness. Today we discussed: stomach issues  Follow up: 3 months   No labs or referrals today  I have sent in the omeprazole he will take this for the next 3 months and we will follow-up to see if you have gotten better.  If you have not gotten better and you have tried to eliminate foods from your diet we can look at possibly needing to go to a stomach doctor.  Please continue to practice social distancing to keep you, your family, and our community safe.  If you must go out, please wear a mask and practice good handwashing.  It was a pleasure to see you and I look forward to continuing to work together on your health and well-being. Please do not hesitate to call the office if you need care or have questions about your care.  Have a wonderful day and week. With Gratitude, Cherly Beach, DNP, AGNP-BC

## 2020-03-11 NOTE — Assessment & Plan Note (Signed)
Acid reflux was not taking the omeprazole that Dr. Ernst Bowler had sent in.  Will try a 56-month supply of this to see if that is helpful.  And follow-up at that time.  If she continues to still have bloating and discomfort when she is eating even though she is worked on trying to eliminate foods from her diet and taking the medication we will look at possible need for GI referral.  Patient is aware of medication and side effects.  And is agreeable to plan of care.

## 2020-03-11 NOTE — Progress Notes (Signed)
Virtual Visit via Telephone Note   This visit type was conducted due to national recommendations for restrictions regarding the COVID-19 Pandemic (e.g. social distancing) in an effort to limit this patient's exposure and mitigate transmission in our community.  Due to her co-morbid illnesses, this patient is at least at moderate risk for complications without adequate follow up.  This format is felt to be most appropriate for this patient at this time.  The patient did not have access to video technology/had technical difficulties with video requiring transitioning to audio format only (telephone).  All issues noted in this document were discussed and addressed.  No physical exam could be performed with this format.    Evaluation Performed:  Follow-up visit  Date:  03/11/2020   ID:  Lacey Hartman, DOB 13-Mar-1996, MRN [id number]  Patient Location: Home Provider Location: Office  Location of Patient: Home Location of Provider: Telehealth Consent was obtain for visit to be over via telehealth. I verified that I am speaking with the correct person using two identifiers.  PCP:  Perlie Mayo, NP   Chief Complaint: Stomach problem  History of Present Illness:    Lacey Hartman is a 24 y.o. female with history of having upset stomach and bloating.  She reports that it is getting a little bit better but she still has trouble with this.  Stomach still swells and bloats after she eats.  I sent her to the allergist to see if this might be a food allergy in relation.  He reported that he did not feel like this was allergy related but possibly GERD.  He had started her on omeprazole but she reports that she never got this medication.  We will send this in today to see if that is helpful.  The patient does not have symptoms concerning for COVID-19 infection (fever, chills, cough, or new shortness of breath).   Past Medical, Surgical, Social History, Allergies, and Medications have been  Reviewed.  Past Medical History:  Diagnosis Date  . ADHD (attention deficit hyperactivity disorder)   . Anxiety   . Asthma    inhaler prn  . Change in color of skin mole 11/22/2019  . Depression   . Ex-cigarette smoker 11/22/2019  . Hard of hearing   . Right conjunctivitis 08/10/2018  . Smoker 12/03/2013   1-2ppd prior to pregnancy, down to 2cigs/day trying to quit                       01/01/14: 4cigs/day   [facility] [facility]   . 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   Past Surgical History:  Procedure Laterality Date  . NO PAST SURGERIES       Current Meds  Medication Sig  . albuterol (VENTOLIN HFA) 108 (90 Base) MCG/ACT inhaler Inhale 1-2 puffs into the lungs every 4 (four) hours as needed for wheezing or shortness of breath.  . etonogestrel (NEXPLANON) 68 MG IMPL implant 1 each by Subdermal route once.  Marland Kitchen FLUoxetine (PROZAC) 40 MG capsule Take 1 capsule by mouth once daily  . hydrOXYzine (ATARAX/VISTARIL) 10 MG tablet Take 1 tablet (10 mg total) by mouth at bedtime as needed for anxiety.  . methylphenidate (CONCERTA) 36 MG PO CR tablet Take 1 tablet (36 mg total) by mouth daily.  Marland Kitchen omeprazole (PRILOSEC) 20 MG capsule Take one tablet daily in the evening.  . rizatriptan (MAXALT) 5 MG tablet Take 1 tablet (5 mg total) by mouth as needed for  migraine. May repeat in 2 hours if needed  . Saccharomyces boulardii (PROBIOTIC) 250 MG CAPS Take 1 capsule by mouth at bedtime.     Allergies:   Codeine   ROS:   Please see the history of present illness.    All other systems reviewed and are negative.   Labs/Other Tests and Data Reviewed:    Recent Labs: 07/24/2019: ALT 21; BUN 12; Creatinine, Ser 0.97; Hemoglobin 13.6; Platelets 238; Potassium 4.1; Sodium 140   Recent Lipid Panel Lab Results  Component Value Date/Time   CHOL 147 07/24/2019 08:10 AM   TRIG 73 07/24/2019 08:10 AM   HDL 44 07/24/2019 08:10 AM   CHOLHDL 3.3 07/24/2019 08:10 AM   LDLCALC 89 07/24/2019 08:10 AM    Wt  Readings from Last 3 Encounters:  03/11/20 169 lb (76.7 kg)  12/21/19 169 lb (76.7 kg)  12/20/19 167 lb (75.8 kg)     Objective:    Vital Signs:  BP 108/70   Ht 5\' 3"  (1.6 m)   Wt 169 lb (76.7 kg)   BMI 29.94 kg/m    VITAL SIGNS:  reviewed GEN:  Alert and oriented RESPIRATORY:  No shortness of breath noted in conversation PSYCH:  Normal affect and mood  ASSESSMENT & PLAN:    1. Gastroesophageal reflux disease, unspecified whether esophagitis present  - omeprazole (PRILOSEC) 20 MG capsule; Take one tablet daily in the evening.  Dispense: 90 capsule; Refill: 1   Time:   Today, I have spent 10 minutes with the patient with telehealth technology discussing the above problems.     Medication Adjustments/Labs and Tests Ordered: Current medicines are reviewed at length with the patient today.  Concerns regarding medicines are outlined above.   Tests Ordered: No orders of the defined types were placed in this encounter.   Medication Changes: No orders of the defined types were placed in this encounter.   Disposition:  Follow up 3 months Signed, Perlie Mayo, NP  03/11/2020 10:57 AM     [place]

## 2020-03-13 ENCOUNTER — Other Ambulatory Visit: Payer: Self-pay

## 2020-03-13 ENCOUNTER — Telehealth (INDEPENDENT_AMBULATORY_CARE_PROVIDER_SITE_OTHER): Payer: Medicaid Other | Admitting: Psychiatry

## 2020-03-13 ENCOUNTER — Encounter (HOSPITAL_COMMUNITY): Payer: Self-pay | Admitting: Psychiatry

## 2020-03-13 DIAGNOSIS — F419 Anxiety disorder, unspecified: Secondary | ICD-10-CM | POA: Diagnosis not present

## 2020-03-13 DIAGNOSIS — F5104 Psychophysiologic insomnia: Secondary | ICD-10-CM

## 2020-03-13 DIAGNOSIS — F900 Attention-deficit hyperactivity disorder, predominantly inattentive type: Secondary | ICD-10-CM

## 2020-03-13 MED ORDER — FLUOXETINE HCL 40 MG PO CAPS: 40.0000 mg | ORAL_CAPSULE | Freq: Every day | ORAL | 2 refills | Status: DC

## 2020-03-13 MED ORDER — METHYLPHENIDATE HCL ER (OSM) 36 MG PO TBCR: 36.0000 mg | EXTENDED_RELEASE_TABLET | Freq: Every day | ORAL | 0 refills | Status: DC

## 2020-03-13 MED ORDER — METHYLPHENIDATE HCL ER (OSM) 36 MG PO TBCR
36.0000 mg | EXTENDED_RELEASE_TABLET | Freq: Every day | ORAL | 0 refills | Status: DC
Start: 2020-03-13 — End: 2020-06-11

## 2020-03-13 NOTE — Progress Notes (Signed)
Virtual Visit via Telephone Note  I connected with Lacey Hartman on 03/13/20 at 10:20 AM EDT by telephone and verified that I am speaking with the correct person using two identifiers.   I discussed the limitations, risks, security and privacy concerns of performing an evaluation and management service by telephone and the availability of in person appointments. I also discussed with the patient that there may be a patient responsible charge related to this service. The patient expressed understanding and agreed to proceed.    I discussed the assessment and treatment plan with the patient. The patient was provided an opportunity to ask questions and all were answered. The patient agreed with the plan and demonstrated an understanding of the instructions.   The patient was advised to call back or seek an in-person evaluation if the symptoms worsen or if the condition fails to improve as anticipated.  I provided 15 minutes of non-face-to-face time during this encounter. Location: provider office, patient home  Levonne Spiller, MD  [facility] MD/PA/NP OP Progress Note  03/13/2020 10:45 AM Lacey Hartman  MRN:  [id number]  Chief Complaint:  Chief Complaint    Depression; ADHD; Follow-up     HPI: This patient is a 24 year old single white female lives with her boyfriend 2 daughters ages6and 5years and one son age 39in [place]. 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  The patient was referred by her primary physician, Dr. Sallee Lange, for further assessment and treatment of ADD and depression.  The patient states that she has always struggled in school. She doesn't know much about her history but does know that she was born full-term but spent most of her first year of life in the NICU at [facility]. She is not sure why. She was diagnosed with hearing loss and required speech therapy although way through school. She had trouble learning to read and write and despite being an  exceptional children's classes still does not read or write very well. When she was 24 years old in [place] she was very hyperactive and unfocused and distractible. At that time she was diagnosed with ADHD and was on Ritalin for a while and later Adderall. She stopped these medications when she became pregnant at age 2.  The patient states that she used to go to [facility] because of depression. The physician there put her on Prozac last year and she is still taking it although she has not been back to that clinic in quite some time. She states that she still has difficulties with bad temper crying spells anger anxiety and mood swings. At times she gets overwhelmed in caring for the children. Her husband has seizures and is on disability so he is able to be at home and help her most of the time. She also gets anxious and nervous around new people. She denies auditory or visual hallucinations or paranoia. She does not use drugs or alcohol. She is now using an Implanon implant to prevent pregnancy as she has had so many children in such a short amount of time.  The patient states she is interested in getting back on medication for ADHD. She's no longer hyperactive but very unfocused distractible and on able to finish tasks  The patient returns for follow-up after about 4 months.  We have missed some appointments.  She states that overall she has been doing fairly well but she ran out of her Prozac a few days ago and this makes her feel anxious.  Most of the time she is sleeping okay.  Her mood is generally good when she is on her medication.  The Concerta continues to help her focus.  She denies significant panic or anxiety severe depression or suicidal ideation. Visit Diagnosis:    ICD-10-CM   1. Attention deficit hyperactivity disorder (ADHD), predominantly inattentive type  F90.0   2. Psychophysiological insomnia  F51.04   3. Anxiety  F41.9     Past Psychiatric History: none  Past Medical History:   Past Medical History:  Diagnosis Date  . ADHD (attention deficit hyperactivity disorder)   . Anxiety   . Asthma    inhaler prn  . Change in color of skin mole 11/22/2019  . Depression   . Ex-cigarette smoker 11/22/2019  . Hard of hearing   . Right conjunctivitis 08/10/2018  . Smoker 12/03/2013   1-2ppd prior to pregnancy, down to 2cigs/day trying to quit                       01/01/14: 4cigs/day   [facility]   . 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    Past Surgical History:  Procedure Laterality Date  . NO PAST SURGERIES      Family Psychiatric History: none  Family History:  Family History  Problem Relation Age of Onset  . Depression Mother   . Anxiety disorder Mother   . Bipolar disorder Mother   . Asthma Mother   . Cancer Maternal Grandmother   . Cancer Maternal Uncle   . ADD / ADHD Brother   . ADD / ADHD Brother   . Asthma Brother   . Allergic rhinitis Brother   . Cancer Paternal Grandmother   . Angioedema Neg Hx   . Atopy Neg Hx   . Eczema Neg Hx   . Immunodeficiency Neg Hx   . Urticaria Neg Hx     Social History:  Social History   Socioeconomic History  . Marital status: Significant Other    Spouse name: [name]  . Number of children: 3  . Years of education: Not on file  . Highest education level: 9th grade  Occupational History  . Not on file  Tobacco Use  . Smoking status: Former Smoker    Packs/day: 0.33    Years: 7.00    Pack years: 2.31    Types: Cigarettes  . Smokeless tobacco: Never Used  . Tobacco comment: Stopped in 2017, started smoking again 2019  Substance and Sexual Activity  . Alcohol use: No    Alcohol/week: 0.0 standard drinks  . Drug use: No  . Sexual activity: Yes    Birth control/protection: None, Implant  Other Topics Concern  . Not on file  Social History Narrative   Lives with [name] and 3 children   [name]   [name]      Enjoys: playing with kids, going outside, beach, crafts      Diet: does not eat a  lot of veggies or fruits, a lot of junk    Caffeine: 15 sodas   Water: 4-5 cups daily      Wears seat belt    Smoke detectors   Does not use phone while driving    Social Determinants of Health   Financial Resource Strain: Low Risk   . Difficulty of Paying Living Expenses: Not hard at all  Food Insecurity: No Food Insecurity  . Worried About Charity fundraiser in the Last Year: Never true  .  Ran Out of Food in the Last Year: Never true  Transportation Needs: No Transportation Needs  . Lack of Transportation (Medical): No  . Lack of Transportation (Non-Medical): No  Physical Activity: Inactive  . Days of Exercise per Week: 0 days  . Minutes of Exercise per Session: 0 min  Stress: Stress Concern Present  . Feeling of Stress : To some extent  Social Connections: Somewhat Isolated  . Frequency of Communication with Friends and Family: More than three times a week  . Frequency of Social Gatherings with Friends and Family: More than three times a week  . Attends Religious Services: Never  . Active Member of Clubs or Organizations: No  . Attends Archivist Meetings: Never  . Marital Status: Living with partner    Allergies:  Allergies  Allergen Reactions  . Codeine Hives, Swelling and Rash    & hallucinations/    Metabolic Disorder Labs: No results found for: HGBA1C, MPG No results found for: PROLACTIN Lab Results  Component Value Date   CHOL 147 07/24/2019   TRIG 73 07/24/2019   HDL 44 07/24/2019   CHOLHDL 3.3 07/24/2019   LDLCALC 89 07/24/2019   LDLCALC 85 02/16/2017   Lab Results  Component Value Date   TSH 1.700 02/16/2017    Therapeutic Level Labs: No results found for: LITHIUM No results found for: VALPROATE No components found for:  CBMZ  Current Medications: Current Outpatient Medications  Medication Sig Dispense Refill  . albuterol (VENTOLIN HFA) 108 (90 Base) MCG/ACT inhaler Inhale 1-2 puffs into the lungs every 4 (four) hours as needed  for wheezing or shortness of breath. 18 g 1  . etonogestrel (NEXPLANON) 68 MG IMPL implant 1 each by Subdermal route once.    Marland Kitchen FLUoxetine (PROZAC) 40 MG capsule Take 1 capsule (40 mg total) by mouth daily. 30 capsule 2  . methylphenidate (CONCERTA) 36 MG PO CR tablet Take 1 tablet (36 mg total) by mouth daily. 30 tablet 0  . methylphenidate (CONCERTA) 36 MG PO CR tablet Take 1 tablet (36 mg total) by mouth daily. Fill after 04/11/2020 30 tablet 0  . methylphenidate (CONCERTA) 36 MG PO CR tablet Take 1 tablet (36 mg total) by mouth daily. 30 tablet 0  . omeprazole (PRILOSEC) 20 MG capsule Take one tablet daily in the evening. 90 capsule 1  . rizatriptan (MAXALT) 5 MG tablet Take 1 tablet (5 mg total) by mouth as needed for migraine. May repeat in 2 hours if needed 10 tablet 0  . Saccharomyces boulardii (PROBIOTIC) 250 MG CAPS Take 1 capsule by mouth at bedtime. 30 capsule 1   No current facility-administered medications for this visit.     Musculoskeletal: Strength & Muscle Tone: within normal limits Gait & Station: normal Patient leans: N/A  Psychiatric Specialty Exam: Review of Systems  All other systems reviewed and are negative.   unknown if currently breastfeeding.There is no height or weight on file to calculate BMI.  General Appearance: NA  Eye Contact:  NA  Speech:  Clear and Coherent  Volume:  Normal  Mood:  Euthymic  Affect: NA  Thought Process:  Goal Directed  Orientation:  Full (Time, Place, and Person)  Thought Content: WDL   Suicidal Thoughts:  No  Homicidal Thoughts:  No  Memory:  Immediate;   Good Recent;   Good Remote;   Fair  Judgement:  Good  Insight:  Fair  Psychomotor Activity:  Normal  Concentration:  Concentration: Fair and Attention Span: Fair  Recall:  Roel Cluck of Knowledge: Fair  Language: Good  Akathisia:  No  Handed:  Right  AIMS (if indicated): not done  Assets:  Communication Skills Desire for Improvement Physical  Health Resilience Social Support Talents/Skills  ADL's:  Intact  Cognition: Impaired,  Mild  Sleep:  Good   Screenings: GAD-7     Video Visit from 03/11/2020 in [facility] Office Visit from 12/20/2019 in [facility] Office Visit from 11/22/2019 in [facility]  Total GAD-7 Score  5  18  14     PHQ2-9     Video Visit from 03/11/2020 in [facility] Office Visit from 12/20/2019 in [facility] Visit from 11/22/2019 in [facility] Office Visit from 07/20/2019 in [facility] Visit from 02/02/2017 in [facility]  PHQ-2 Total Score  0  1  1  1  3   PHQ-9 Total Score  5  18  10  4  8        Assessment and Plan: This patient is a 24 year old female with a history of mild cognitive impairment, hearing loss depression and ADD.  She continues to do well on her regimen.  She will continue Prozac 20 mg daily for depression and Concerta 36 mg every morning for ADD.  She will return to see me in 3 months   Levonne Spiller, MD 03/13/2020, 10:45 AM

## 2020-06-11 ENCOUNTER — Ambulatory Visit (INDEPENDENT_AMBULATORY_CARE_PROVIDER_SITE_OTHER): Payer: Medicaid Other | Admitting: Family Medicine

## 2020-06-11 ENCOUNTER — Encounter: Payer: Self-pay | Admitting: Family Medicine

## 2020-06-11 ENCOUNTER — Other Ambulatory Visit: Payer: Self-pay

## 2020-06-11 VITALS — BP 113/77 | HR 79 | Temp 97.0°F | Resp 18 | Ht 60.0 in | Wt 168.1 lb

## 2020-06-11 DIAGNOSIS — K219 Gastro-esophageal reflux disease without esophagitis: Secondary | ICD-10-CM | POA: Diagnosis not present

## 2020-06-11 DIAGNOSIS — R140 Abdominal distension (gaseous): Secondary | ICD-10-CM | POA: Diagnosis not present

## 2020-06-11 NOTE — Assessment & Plan Note (Addendum)
Has been taking the Prilosec without much improvement..  She reports that she still taking it but still having bloating and discomfort.  She tried to limit foods without much success.  Possible need for GI referral back in May no improvement so we will do the referral today.  She is agreeable to this plan.

## 2020-06-11 NOTE — Assessment & Plan Note (Signed)
We have tried probiotics, we have not tried treatment for GERD.  Without much success.  Referral to GI made today.

## 2020-06-11 NOTE — Patient Instructions (Addendum)
I appreciate the opportunity to provide you with care for your health and wellness. Today we discussed: stomach trouble   Follow up: Feb for Annual visit   No labs  Referrals today: Stomach dr  Continue all medication as directed. Continue to focus on eating a well balanced diet. Avoid acidic foods, spicy foods, and anything that you know makes your stomach swell.  Please continue to practice social distancing to keep you, your family, and our community safe.  If you must go out, please wear a mask and practice good handwashing.  It was a pleasure to see you and I look forward to continuing to work together on your health and well-being. Please do not hesitate to call the office if you need care or have questions about your care.  Have a wonderful day and week. With Gratitude, Cherly Beach, DNP, AGNP-BC

## 2020-06-11 NOTE — Progress Notes (Signed)
Subjective:  Patient ID: Lacey Hartman, female    DOB: Sep 19, 1996  Age: 24 y.o. MRN: [id number]  CC:  Chief Complaint  Patient presents with  . Follow-up    3 month follow up everything is ok right now       HPI  HPI Lacey Hartman is a 24 year old female patient who presents today to follow-up on stomach discomfort.  Over the last several months we have tried to see how her stomach would be as far as diet elimination and treatment for GERD.  No food has been identified that would cause the abdominal swelling and discomfort.  Lacey Hartman denies having diarrhea or constipation.  Lacey Hartman reports some nausea but no vomiting.  Lacey Hartman does not know what is causing it and Lacey Hartman does not recall having any members with a similar symptoms.  Lacey Hartman reports Lacey Hartman is still trying to work out.  Drink water and eat a balanced diet.  Lacey Hartman was not sleeping well but now the kids are back in school so Lacey Hartman is sleeping much better.  Lacey Hartman declines a flu shot today.  Today patient denies signs and symptoms of COVID 19 infection including fever, chills, cough, shortness of breath, and headache. Past Medical, Surgical, Social History, Allergies, and Medications have been Reviewed.   Past Medical History:  Diagnosis Date  . ADHD (attention deficit hyperactivity disorder)   . Anxiety   . Asthma    inhaler prn  . Change in color of skin mole 11/22/2019  . Depression   . Ex-cigarette smoker 11/22/2019  . Hard of hearing   . Right conjunctivitis 08/10/2018  . Smoker 12/03/2013   1-2ppd prior to pregnancy, down to 2cigs/day trying to quit                       01/01/14: 4cigs/day   [facility]   . 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    Current Meds  Medication Sig  . albuterol (VENTOLIN HFA) 108 (90 Base) MCG/ACT inhaler Inhale 1-2 puffs into the lungs every 4 (four) hours as needed for wheezing or shortness of breath.  . etonogestrel (NEXPLANON) 68 MG IMPL implant 1 each by Subdermal route once.  Marland Kitchen FLUoxetine (PROZAC) 40 MG  capsule Take 1 capsule (40 mg total) by mouth daily.  . methylphenidate (CONCERTA) 36 MG PO CR tablet Take 1 tablet (36 mg total) by mouth daily.  Marland Kitchen omeprazole (PRILOSEC) 20 MG capsule Take one tablet daily in the evening.  . rizatriptan (MAXALT) 5 MG tablet Take 1 tablet (5 mg total) by mouth as needed for migraine. May repeat in 2 hours if needed  . Saccharomyces boulardii (PROBIOTIC) 250 MG CAPS Take 1 capsule by mouth at bedtime.  . [DISCONTINUED] methylphenidate (CONCERTA) 36 MG PO CR tablet Take 1 tablet (36 mg total) by mouth daily. Fill after 04/11/2020  . [DISCONTINUED] methylphenidate (CONCERTA) 36 MG PO CR tablet Take 1 tablet (36 mg total) by mouth daily.    ROS:  Review of Systems  Constitutional: Negative.   HENT: Negative.   Eyes: Negative.   Respiratory: Negative.   Cardiovascular: Negative.   Gastrointestinal: Positive for abdominal pain and nausea.  Genitourinary: Negative.   Musculoskeletal: Negative.   Skin: Negative.   Neurological: Negative.   Endo/Heme/Allergies: Negative.   Psychiatric/Behavioral: Negative.      Objective:   Today's Vitals: BP 113/77 (BP Location: Right Arm, Patient Position: Sitting, Cuff Size: Normal)   Pulse 79   Temp (!) 97  F (36.1 C) (Temporal)   Resp 18   Ht 5' (1.524 m)   Wt 168 lb 1.9 oz (76.3 kg)   SpO2 97%   BMI 32.83 kg/m  Vitals with BMI 06/11/2020 03/11/2020 12/21/2019  Height 5\' 0"  5\' 3"  5\' 3"   Weight 168 lbs 2 oz 169 lbs 169 lbs  BMI 32.83 94.50 38.88  Systolic 280 034 917  Diastolic 77 70 70  Pulse 79 - 86  Some encounter information is confidential and restricted. Go to Review Flowsheets activity to see all data.     Physical Exam Vitals and nursing note reviewed.  Constitutional:      Appearance: Normal appearance. Lacey Hartman is well-developed and well-groomed. Lacey Hartman is obese.  HENT:     Head: Normocephalic and atraumatic.     Right Ear: External ear normal.     Left Ear: External ear normal.     Mouth/Throat:      Comments: Mask in place  Eyes:     General:        Right eye: No discharge.        Left eye: No discharge.     Conjunctiva/sclera: Conjunctivae normal.  Cardiovascular:     Rate and Rhythm: Normal rate and regular rhythm.     Pulses: Normal pulses.     Heart sounds: Normal heart sounds.  Pulmonary:     Effort: Pulmonary effort is normal.     Breath sounds: Normal breath sounds.  Musculoskeletal:        General: Normal range of motion.     Cervical back: Normal range of motion and neck supple.  Skin:    General: Skin is warm.  Neurological:     General: No focal deficit present.     Mental Status: Lacey Hartman is alert and oriented to person, place, and time.  Psychiatric:        Attention and Perception: Attention normal.        Mood and Affect: Mood normal.        Speech: Speech normal.        Behavior: Behavior normal. Behavior is cooperative.        Thought Content: Thought content normal.        Cognition and Memory: Cognition normal.        Judgment: Judgment normal.    Assessment   1. Gastroesophageal reflux disease, unspecified whether esophagitis present   2. Bloated abdomen     Tests ordered Orders Placed This Encounter  Procedures  . Ambulatory referral to Gastroenterology     Plan: Please see assessment and plan per problem list above.   No orders of the defined types were placed in this encounter.   Patient to follow-up in Feb .  Perlie Mayo, NP

## 2020-06-13 ENCOUNTER — Other Ambulatory Visit: Payer: Self-pay

## 2020-06-13 ENCOUNTER — Telehealth (INDEPENDENT_AMBULATORY_CARE_PROVIDER_SITE_OTHER): Payer: Medicaid Other | Admitting: Psychiatry

## 2020-06-13 ENCOUNTER — Encounter (HOSPITAL_COMMUNITY): Payer: Self-pay | Admitting: Psychiatry

## 2020-06-13 ENCOUNTER — Encounter: Payer: Self-pay | Admitting: Internal Medicine

## 2020-06-13 DIAGNOSIS — F331 Major depressive disorder, recurrent, moderate: Secondary | ICD-10-CM

## 2020-06-13 DIAGNOSIS — F900 Attention-deficit hyperactivity disorder, predominantly inattentive type: Secondary | ICD-10-CM

## 2020-06-13 MED ORDER — FLUOXETINE HCL 40 MG PO CAPS: 40.0000 mg | ORAL_CAPSULE | Freq: Every day | ORAL | 2 refills | Status: DC

## 2020-06-13 MED ORDER — METHYLPHENIDATE HCL ER (OSM) 36 MG PO TBCR
36.0000 mg | EXTENDED_RELEASE_TABLET | Freq: Every day | ORAL | 0 refills | Status: DC
Start: 2020-06-13 — End: 2020-09-02

## 2020-06-13 MED ORDER — METHYLPHENIDATE HCL ER (OSM) 36 MG PO TBCR: 36.0000 mg | EXTENDED_RELEASE_TABLET | Freq: Every day | ORAL | 0 refills | Status: DC

## 2020-06-13 NOTE — Progress Notes (Signed)
Virtual Visit via Telephone Note  I connected with Lacey Hartman on 06/13/20 at 10:00 AM EDT by telephone and verified that I am speaking with the correct person using two identifiers.   I discussed the limitations, risks, security and privacy concerns of performing an evaluation and management service by telephone and the availability of in person appointments. I also discussed with the patient that there may be a patient responsible charge related to this service. The patient expressed understanding and agreed to proceed.     I discussed the assessment and treatment plan with the patient. The patient was provided an opportunity to ask questions and all were answered. The patient agreed with the plan and demonstrated an understanding of the instructions.   The patient was advised to call back or seek an in-person evaluation if the symptoms worsen or if the condition fails to improve as anticipated.  I provided 15 minutes of non-face-to-face time during this encounter. Location: Provider office, patient home  Levonne Spiller, MD  [facility] MD/PA/NP OP Progress Note  06/13/2020 10:08 AM Lacey Hartman  MRN:  [id number]  Chief Complaint:  Chief Complaint    Depression; Anxiety; Follow-up; ADD     HPI: This patient is a 24 year old single white female lives with her boyfriend 2 daughters  and one son in [place]. 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  The patient was referred by her primary physician, Dr. Sallee Lange, for further assessment and treatment of ADD and depression.  The patient states that she has always struggled in school. She doesn't know much about her history but does know that she was born full-term but spent most of her first year of life in the NICU at [facility]. She is not sure why. She was diagnosed with hearing loss and required speech therapy although way through school. She had trouble learning to read and write and despite being an exceptional  children's classes still does not read or write very well. When she was 24 years old in [place] she was very hyperactive and unfocused and distractible. At that time she was diagnosed with ADHD and was on Ritalin for a while and later Adderall. She stopped these medications when she became pregnant at age 29.  The patient states that she used to go to [facility] because of depression. The physician there put her on Prozac last year and she is still taking it although she has not been back to that clinic in quite some time. She states that she still has difficulties with bad temper crying spells anger anxiety and mood swings. At times she gets overwhelmed in caring for the children. Her husband has seizures and is on disability so he is able to be at home and help her most of the time. She also gets anxious and nervous around new people. She denies auditory or visual hallucinations or paranoia. She does not use drugs or alcohol. She is now using an Implanon implant to prevent pregnancy as she has had so many children in such a short amount of time.  The patient states she is interested in getting back on medication for ADHD. She's no longer hyperactive but very unfocused distractible and on able to finish tasks  The patient returns for follow-up after 3 months.  She states that she is doing very well.  All of her children are now in school and she has more time to herself.  She states the longer she stays on her medication she  denies depression crying spells difficulty sleeping or irritability.  She is focusing very well with the Concerta. Visit Diagnosis:    ICD-10-CM   1. Moderate episode of recurrent major depressive disorder (HCC)  F33.1   2. Attention deficit hyperactivity disorder (ADHD), predominantly inattentive type  F90.0     Past Psychiatric History: none  Past Medical History:  Past Medical History:  Diagnosis Date  . ADHD (attention deficit hyperactivity disorder)   . Anxiety   .  Asthma    inhaler prn  . Change in color of skin mole 11/22/2019  . Depression   . Ex-cigarette smoker 11/22/2019  . Hard of hearing   . Right conjunctivitis 08/10/2018  . Smoker 12/03/2013   1-2ppd prior to pregnancy, down to 2cigs/day trying to quit                       01/01/14: 4cigs/day   [facility]   . 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    Past Surgical History:  Procedure Laterality Date  . NO PAST SURGERIES      Family Psychiatric History: see below  Family History:  Family History  Problem Relation Age of Onset  . Depression Mother   . Anxiety disorder Mother   . Bipolar disorder Mother   . Asthma Mother   . Cancer Maternal Grandmother   . Cancer Maternal Uncle   . ADD / ADHD Brother   . ADD / ADHD Brother   . Asthma Brother   . Allergic rhinitis Brother   . Cancer Paternal Grandmother   . Angioedema Neg Hx   . Atopy Neg Hx   . Eczema Neg Hx   . Immunodeficiency Neg Hx   . Urticaria Neg Hx     Social History:  Social History   Socioeconomic History  . Marital status: Significant Other    Spouse name: [name]  . Number of children: 3  . Years of education: Not on file  . Highest education level: 9th grade  Occupational History  . Not on file  Tobacco Use  . Smoking status: Former Smoker    Packs/day: 0.33    Years: 7.00    Pack years: 2.31    Types: Cigarettes  . Smokeless tobacco: Never Used  . Tobacco comment: Stopped in 2017, started smoking again 2019  Vaping Use  . Vaping Use: Never used  Substance and Sexual Activity  . Alcohol use: No    Alcohol/week: 0.0 standard drinks  . Drug use: No  . Sexual activity: Yes    Birth control/protection: None, Implant  Other Topics Concern  . Not on file  Social History Narrative   Lives with [name] and 3 children   [name]   [name]      Enjoys: playing with kids, going outside, beach, crafts      Diet: does not eat a lot of veggies or fruits, a lot of junk    Caffeine: 15 sodas    Water: 4-5 cups daily      Wears seat belt    Smoke detectors   Does not use phone while driving    Social Determinants of Health   Financial Resource Strain: Low Risk   . Difficulty of Paying Living Expenses: Not hard at all  Food Insecurity: No Food Insecurity  . Worried About Charity fundraiser in the Last Year: Never true  . Ran Out of Food in the Last Year: Never true  Transportation Needs: No Transportation Needs  . Lack of Transportation (Medical): No  . Lack of Transportation (Non-Medical): No  Physical Activity: Inactive  . Days of Exercise per Week: 0 days  . Minutes of Exercise per Session: 0 min  Stress: Stress Concern Present  . Feeling of Stress : To some extent  Social Connections: Moderately Isolated  . Frequency of Communication with Friends and Family: More than three times a week  . Frequency of Social Gatherings with Friends and Family: More than three times a week  . Attends Religious Services: Never  . Active Member of Clubs or Organizations: No  . Attends Archivist Meetings: Never  . Marital Status: Living with partner    Allergies:  Allergies  Allergen Reactions  . Codeine Hives, Swelling and Rash    & hallucinations/    Metabolic Disorder Labs: No results found for: HGBA1C, MPG No results found for: PROLACTIN Lab Results  Component Value Date   CHOL 147 07/24/2019   TRIG 73 07/24/2019   HDL 44 07/24/2019   CHOLHDL 3.3 07/24/2019   LDLCALC 89 07/24/2019   LDLCALC 85 02/16/2017   Lab Results  Component Value Date   TSH 1.700 02/16/2017    Therapeutic Level Labs: No results found for: LITHIUM No results found for: VALPROATE No components found for:  CBMZ  Current Medications: Current Outpatient Medications  Medication Sig Dispense Refill  . albuterol (VENTOLIN HFA) 108 (90 Base) MCG/ACT inhaler Inhale 1-2 puffs into the lungs every 4 (four) hours as needed for wheezing or shortness of breath. 18 g 1  . etonogestrel  (NEXPLANON) 68 MG IMPL implant 1 each by Subdermal route once.    Marland Kitchen FLUoxetine (PROZAC) 40 MG capsule Take 1 capsule (40 mg total) by mouth daily. 30 capsule 2  . methylphenidate (CONCERTA) 36 MG PO CR tablet Take 1 tablet (36 mg total) by mouth daily. 30 tablet 0  . methylphenidate (CONCERTA) 36 MG PO CR tablet Take 1 tablet (36 mg total) by mouth daily. 30 tablet 0  . methylphenidate (CONCERTA) 36 MG PO CR tablet Take 1 tablet (36 mg total) by mouth daily. 30 tablet 0  . omeprazole (PRILOSEC) 20 MG capsule Take one tablet daily in the evening. 90 capsule 1  . rizatriptan (MAXALT) 5 MG tablet Take 1 tablet (5 mg total) by mouth as needed for migraine. May repeat in 2 hours if needed 10 tablet 0  . Saccharomyces boulardii (PROBIOTIC) 250 MG CAPS Take 1 capsule by mouth at bedtime. 30 capsule 1   No current facility-administered medications for this visit.     Musculoskeletal: Strength & Muscle Tone: within normal limits Gait & Station: normal Patient leans: N/A  Psychiatric Specialty Exam: Review of Systems  All other systems reviewed and are negative.   unknown if currently breastfeeding.There is no height or weight on file to calculate BMI.  General Appearance: NA  Eye Contact:  NA  Speech:  Clear and Coherent  Volume:  Normal  Mood:  Euthymic  Affect:  NA  Thought Process:  Goal Directed  Orientation:  NA  Thought Content: WDL   Suicidal Thoughts:  No  Homicidal Thoughts:  No  Memory:  Immediate;   Good Recent;   Good Remote;   Good  Judgement:  Good  Insight:  Fair  Psychomotor Activity:  Normal  Concentration:  Concentration: Good and Attention Span: Good  Recall:  Good  Fund of Knowledge: Fair  Language: Good  Akathisia:  No  Handed:  Right  AIMS (if indicated): not done  Assets:  Communication Skills Desire for Improvement Physical Health Resilience Social Support Talents/Skills  ADL's:  Intact  Cognition: Impaired,  Mild  Sleep:  Good    Screenings: GAD-7     Video Visit from 03/11/2020 in [facility] Office Visit from 12/20/2019 in [facility] Office Visit from 11/22/2019 in [facility]  Total GAD-7 Score 5 18 14     PHQ2-9     Video Visit from 03/11/2020 in [facility] Office Visit from 12/20/2019 in [facility] Office Visit from 11/22/2019 in [facility] Office Visit from 07/20/2019 in [facility] Visit from 02/02/2017 in [facility]  PHQ-2 Total Score 0 1 1 1 3   PHQ-9 Total Score 5 18 10 4 8        Assessment and Plan: This patient is a 24 year old female with a history of mild cognitive impairment hearing loss depression and ADD.  She continues to do well on her current regimen.  She will continue Prozac 20 mg daily for depression and Concerta 36 mg every morning for ADD.  She will return to see me in 3 months   Levonne Spiller, MD 06/13/2020, 10:08 AM

## 2020-07-16 ENCOUNTER — Telehealth: Payer: Self-pay | Admitting: Family Medicine

## 2020-07-16 NOTE — Telephone Encounter (Signed)
ERROR

## 2020-07-17 ENCOUNTER — Telehealth (INDEPENDENT_AMBULATORY_CARE_PROVIDER_SITE_OTHER): Payer: Medicaid Other | Admitting: Family Medicine

## 2020-07-17 ENCOUNTER — Other Ambulatory Visit: Payer: Self-pay

## 2020-07-17 ENCOUNTER — Encounter: Payer: Self-pay | Admitting: Family Medicine

## 2020-07-17 VITALS — BP 113/77 | Ht 60.0 in | Wt 168.0 lb

## 2020-07-17 DIAGNOSIS — H6693 Otitis media, unspecified, bilateral: Secondary | ICD-10-CM | POA: Diagnosis not present

## 2020-07-17 MED ORDER — AMOXICILLIN-POT CLAVULANATE 875-125 MG PO TABS: 1.0000 | ORAL_TABLET | Freq: Two times a day (BID) | ORAL | 0 refills | Status: AC

## 2020-07-17 NOTE — Progress Notes (Signed)
Virtual Visit via Telephone Note   This visit type was conducted due to national recommendations for restrictions regarding the COVID-19 Pandemic (e.g. social distancing) in an effort to limit this patient's exposure and mitigate transmission in our community.  Due to her co-morbid illnesses, this patient is at least at moderate risk for complications without adequate follow up.  This format is felt to be most appropriate for this patient at this time.  The patient did not have access to video technology/had technical difficulties with video requiring transitioning to audio format only (telephone).  All issues noted in this document were discussed and addressed.  No physical exam could be performed with this format.    Evaluation Performed:  Follow-up visit  Date:  07/17/2020   ID:  Lacey Hartman, DOB 12-12-1995, MRN [id number]  Patient Location: Home Provider Location: Office/Clinic  Location of Patient: Home Location of Provider: Telehealth Consent was obtain for visit to be over via telehealth. I verified that I am speaking with the correct person using two identifiers.  PCP:  Perlie Mayo, NP   Chief Complaint:  Ear pain  History of Present Illness:    Lacey Hartman is a 24 y.o. female with a history as stated below.  Started with having bilateral ear pain over a week ago.  She now reports having drainage down the back of her throat.  Her top teeth are starting to ache and hurt.  A little bit of a headache and some facial pain.  She denies having a cough, congestion or fever.  She is not been around anybody that has been sick.  She has not tried anything over-the-counter for sinus infection as she reports that this is a common occurrence with her secondary to her wearing hearing aids and fluid getting buildup behind them and causing ear infection she has had to be treated multiple times for this.  She reports that she has not seen the individuals who prescribed her her hearing  aids but reports that she probably needs to follow-up with them.  Additionally she reports that sometimes she needs to have her ears flushed when this happens.  She is unable to come in today but is willing to come in tomorrow for that.   The patient does not have symptoms concerning for COVID-19 infection (fever, chills, cough, or new shortness of breath).   Past Medical, Surgical, Social History, Allergies, and Medications have been Reviewed.  Past Medical History:  Diagnosis Date  . ADHD (attention deficit hyperactivity disorder)   . Anxiety   . Asthma    inhaler prn  . Change in color of skin mole 11/22/2019  . Depression   . Ex-cigarette smoker 11/22/2019  . Hard of hearing   . Right conjunctivitis 08/10/2018  . Smoker 12/03/2013   1-2ppd prior to pregnancy, down to 2cigs/day trying to quit                       01/01/14: 4cigs/day   [facility]   . 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   Past Surgical History:  Procedure Laterality Date  . NO PAST SURGERIES       Current Meds  Medication Sig  . albuterol (VENTOLIN HFA) 108 (90 Base) MCG/ACT inhaler Inhale 1-2 puffs into the lungs every 4 (four) hours as needed for wheezing or shortness of breath.  . etonogestrel (NEXPLANON) 68 MG IMPL implant 1 each by Subdermal route once.  Marland Kitchen FLUoxetine (PROZAC) 40  MG capsule Take 1 capsule (40 mg total) by mouth daily.  . methylphenidate (CONCERTA) 36 MG PO CR tablet Take 1 tablet (36 mg total) by mouth daily.  . methylphenidate (CONCERTA) 36 MG PO CR tablet Take 1 tablet (36 mg total) by mouth daily.  . methylphenidate (CONCERTA) 36 MG PO CR tablet Take 1 tablet (36 mg total) by mouth daily.  Marland Kitchen omeprazole (PRILOSEC) 20 MG capsule Take one tablet daily in the evening.  . rizatriptan (MAXALT) 5 MG tablet Take 1 tablet (5 mg total) by mouth as needed for migraine. May repeat in 2 hours if needed  . Saccharomyces boulardii (PROBIOTIC) 250 MG CAPS Take 1 capsule by mouth at bedtime.      Allergies:   Codeine   ROS:   Please see the history of present illness.    All other systems reviewed and are negative.   Labs/Other Tests and Data Reviewed:    Recent Labs: 07/24/2019: ALT 21; BUN 12; Creatinine, Ser 0.97; Hemoglobin 13.6; Platelets 238; Potassium 4.1; Sodium 140   Recent Lipid Panel Lab Results  Component Value Date/Time   CHOL 147 07/24/2019 08:10 AM   TRIG 73 07/24/2019 08:10 AM   HDL 44 07/24/2019 08:10 AM   CHOLHDL 3.3 07/24/2019 08:10 AM   LDLCALC 89 07/24/2019 08:10 AM    Wt Readings from Last 3 Encounters:  07/17/20 168 lb (76.2 kg)  06/11/20 168 lb 1.9 oz (76.3 kg)  03/11/20 169 lb (76.7 kg)     Objective:    Vital Signs:  BP 113/77   Ht 5' (1.524 m)   Wt 168 lb (76.2 kg)   BMI 32.81 kg/m    VITAL SIGNS:  reviewed GEN:  Alert and oriented RESPIRATORY:  No shortness of breath noted in conversation PSYCH:  normal affect and mood   ASSESSMENT & PLAN:    1. Acute infection of both ears  - amoxicillin-clavulanate (AUGMENTIN) 875-125 MG tablet; Take 1 tablet by mouth 2 (two) times daily for 5 days.  Dispense: 10 tablet; Refill: 0   Time:   Today, I have spent 7 minutes with the patient with telehealth technology discussing the above problems.     Medication Adjustments/Labs and Tests Ordered: Current medicines are reviewed at length with the patient today.  Concerns regarding medicines are outlined above.   Tests Ordered: No orders of the defined types were placed in this encounter.   Medication Changes: No orders of the defined types were placed in this encounter.   Disposition:  Follow up 1 day for ear flushing  Signed, Perlie Mayo, NP  07/17/2020 8:26 AM     [facility]

## 2020-07-17 NOTE — Assessment & Plan Note (Signed)
Bilateral ear infection.  Follow-up tomorrow in office for visualization in addition to ear flushing secondary to wearing hearing aids.  Reviewed side effects, risks and benefits of medication.   Patient acknowledged agreement and understanding of the plan.

## 2020-07-17 NOTE — Patient Instructions (Signed)
I appreciate the opportunity to provide you with care for your health and wellness. Today we discussed: ear pain   Follow up: 1 day for ear flushing   No labs or referrals today  Medication ordered for ear pain/infection.  Please continue to practice social distancing to keep you, your family, and our community safe.  If you must go out, please wear a mask and practice good handwashing.  It was a pleasure to see you and I look forward to continuing to work together on your health and well-being. Please do not hesitate to call the office if you need care or have questions about your care.  Have a wonderful day and week. With Gratitude, Cherly Beach, DNP, AGNP-BC

## 2020-07-18 ENCOUNTER — Other Ambulatory Visit: Payer: Self-pay

## 2020-07-18 ENCOUNTER — Ambulatory Visit (INDEPENDENT_AMBULATORY_CARE_PROVIDER_SITE_OTHER): Payer: Medicaid Other | Admitting: Family Medicine

## 2020-07-18 ENCOUNTER — Encounter: Payer: Self-pay | Admitting: Family Medicine

## 2020-07-18 VITALS — BP 119/75 | HR 77 | Resp 18 | Ht 60.0 in | Wt 166.0 lb

## 2020-07-18 DIAGNOSIS — H6121 Impacted cerumen, right ear: Secondary | ICD-10-CM | POA: Diagnosis not present

## 2020-07-18 NOTE — Patient Instructions (Addendum)
I appreciate the opportunity to provide you with care for your health and wellness. Today we discussed: Right ear flushing  Follow up: 11/26/2020 as scheduled  No labs or referrals today  You might feel like your ear is under water for the next few days, that is normal post flushing. It should easy. Continue your antibiotic for Left ear infection.  Please continue to practice social distancing to keep you, your family, and our community safe.  If you must go out, please wear a mask and practice good handwashing.  It was a pleasure to see you and I look forward to continuing to work together on your health and well-being. Please do not hesitate to call the office if you need care or have questions about your care.  Have a wonderful day and week. With Gratitude, Cherly Beach, DNP, AGNP-BC

## 2020-07-18 NOTE — Progress Notes (Signed)
Subjective:  Patient ID: Lacey Hartman, female    DOB: Oct 25, 1995  Age: 24 y.o. MRN: [id number]  CC:  Chief Complaint  Patient presents with  . Cerumen Impaction    R ear feels stopped up x1 week ago      HPI  HPI  [name] presents for ear flushing. She has a current Left side ear infection we are treating. Cerumen impaction on the Right which she is here to have flushed. She denies any other issues or concerns.   Today patient denies signs and symptoms of COVID 19 infection including fever, chills, cough, shortness of breath, and headache. Past Medical, Surgical, Social History, Allergies, and Medications have been Reviewed.   Past Medical History:  Diagnosis Date  . ADHD (attention deficit hyperactivity disorder)   . Anxiety   . Asthma    inhaler prn  . Change in color of skin mole 11/22/2019  . Depression   . Ex-cigarette smoker 11/22/2019  . Hard of hearing   . Right conjunctivitis 08/10/2018  . Smoker 12/03/2013   1-2ppd prior to pregnancy, down to 2cigs/day trying to quit                       01/01/14: 4cigs/day   [facility]   . 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    Current Meds  Medication Sig  . albuterol (VENTOLIN HFA) 108 (90 Base) MCG/ACT inhaler Inhale 1-2 puffs into the lungs every 4 (four) hours as needed for wheezing or shortness of breath.  Marland Kitchen amoxicillin-clavulanate (AUGMENTIN) 875-125 MG tablet Take 1 tablet by mouth 2 (two) times daily for 5 days.  Marland Kitchen etonogestrel (NEXPLANON) 68 MG IMPL implant 1 each by Subdermal route once.  Marland Kitchen FLUoxetine (PROZAC) 40 MG capsule Take 1 capsule (40 mg total) by mouth daily.  . methylphenidate (CONCERTA) 36 MG PO CR tablet Take 1 tablet (36 mg total) by mouth daily.  . methylphenidate (CONCERTA) 36 MG PO CR tablet Take 1 tablet (36 mg total) by mouth daily.  . methylphenidate (CONCERTA) 36 MG PO CR tablet Take 1 tablet (36 mg total) by mouth daily.  Marland Kitchen omeprazole (PRILOSEC) 20 MG capsule Take one tablet daily in  the evening.  . rizatriptan (MAXALT) 5 MG tablet Take 1 tablet (5 mg total) by mouth as needed for migraine. May repeat in 2 hours if needed  . Saccharomyces boulardii (PROBIOTIC) 250 MG CAPS Take 1 capsule by mouth at bedtime.    ROS:  Review of Systems  Constitutional: Negative.   HENT: Positive for ear pain.   Eyes: Negative.   Respiratory: Negative.   Cardiovascular: Negative.   Gastrointestinal: Negative.   Genitourinary: Negative.   Musculoskeletal: Negative.   Skin: Negative.   Neurological: Negative.   Endo/Heme/Allergies: Negative.   Psychiatric/Behavioral: Negative.      Objective:   Today's Vitals: BP 119/75 (BP Location: Right Arm, Patient Position: Sitting, Cuff Size: Normal)   Pulse 77   Resp 18   Ht 5' (1.524 m)   Wt 166 lb (75.3 kg)   SpO2 98%   BMI 32.42 kg/m  Vitals with BMI 07/18/2020 07/17/2020 06/11/2020  Height 5\' 0"  5\' 0"  5\' 0"   Weight 166 lbs 168 lbs 168 lbs 2 oz  BMI 32.42 48.54 62.70  Systolic 350 093 818  Diastolic 75 77 77  Pulse 77 - 79  Some encounter information is confidential and restricted. Go to Review Flowsheets activity to see all data.  Physical Exam Vitals and nursing note reviewed.  Constitutional:      Appearance: Normal appearance. She is well-developed and well-groomed. She is obese.  HENT:     Head: Normocephalic and atraumatic.     Right Ear: External ear normal. There is impacted cerumen.     Left Ear: External ear normal. Tympanic membrane is injected and erythematous.     Mouth/Throat:     Comments: Mask in place  Eyes:     General:        Right eye: No discharge.        Left eye: No discharge.     Conjunctiva/sclera: Conjunctivae normal.  Cardiovascular:     Rate and Rhythm: Normal rate and regular rhythm.     Pulses: Normal pulses.     Heart sounds: Normal heart sounds.  Pulmonary:     Effort: Pulmonary effort is normal.     Breath sounds: Normal breath sounds.  Musculoskeletal:        General: Normal  range of motion.     Cervical back: Normal range of motion and neck supple.  Skin:    General: Skin is warm.  Neurological:     General: No focal deficit present.     Mental Status: She is alert and oriented to person, place, and time.  Psychiatric:        Attention and Perception: Attention normal.        Mood and Affect: Mood normal.        Speech: Speech normal.        Behavior: Behavior normal. Behavior is cooperative.        Thought Content: Thought content normal.        Cognition and Memory: Cognition normal.        Judgment: Judgment normal.     Right ear irrigation:  [place]ok CMA present. Consent obtain. Right ear flushed.  TM visualized post flush-pearl grey.  Assessment   1. Impacted cerumen of right ear     Tests ordered No orders of the defined types were placed in this encounter.    Plan: Please see assessment and plan per problem list above.   No orders of the defined types were placed in this encounter.   Patient to follow-up in as scheduled  Perlie Mayo, NP

## 2020-07-23 ENCOUNTER — Other Ambulatory Visit: Payer: Self-pay

## 2020-07-23 ENCOUNTER — Encounter: Payer: Self-pay | Admitting: Gastroenterology

## 2020-07-23 ENCOUNTER — Ambulatory Visit: Payer: Medicaid Other | Admitting: Gastroenterology

## 2020-07-23 VITALS — BP 118/72 | HR 83 | Temp 97.3°F | Ht 60.0 in | Wt 167.6 lb

## 2020-07-23 DIAGNOSIS — K581 Irritable bowel syndrome with constipation: Secondary | ICD-10-CM | POA: Diagnosis not present

## 2020-07-23 DIAGNOSIS — K589 Irritable bowel syndrome without diarrhea: Secondary | ICD-10-CM | POA: Insufficient documentation

## 2020-07-23 DIAGNOSIS — R140 Abdominal distension (gaseous): Secondary | ICD-10-CM

## 2020-07-23 DIAGNOSIS — K219 Gastro-esophageal reflux disease without esophagitis: Secondary | ICD-10-CM

## 2020-07-23 MED ORDER — LUBIPROSTONE 8 MCG PO CAPS: 8.0000 ug | ORAL_CAPSULE | Freq: Every day | ORAL | 1 refills | Status: DC

## 2020-07-23 NOTE — Progress Notes (Signed)
CC'ED TO PCP 

## 2020-07-23 NOTE — Patient Instructions (Signed)
1. Please go to [facility] for labs. We will be in touch with results and further recommendations.   2. Start Amitiza 43mcg daily with food. This is for IBS and should help with BMs and bloating. If you have diarrhea at first, you can skip taking medication every other day. This should get better with time but if not, please let me know so we can adjust your medication.  3. For reflux: take omeprazole 30-60 minutes before your first meal of the day to PREVENT reflux. If you continue to have issues please let me know.    Food Choices for Gastroesophageal Reflux Disease, Adult When you have gastroesophageal reflux disease (GERD), the foods you eat and your eating habits are very important. Choosing the right foods can help ease the discomfort of GERD. Consider working with a diet and nutrition specialist (dietitian) to help you make healthy food choices. What general guidelines should I follow?  Eating plan  Choose healthy foods low in fat, such as fruits, vegetables, whole grains, low-fat dairy products, and lean meat, fish, and poultry.  Eat frequent, small meals instead of three large meals each day. Eat your meals slowly, in a relaxed setting. Avoid bending over or lying down until 2-3 hours after eating.  Limit high-fat foods such as fatty meats or fried foods.  Limit your intake of oils, butter, and shortening to less than 8 teaspoons each day.  Avoid the following: ? Foods that cause symptoms. These may be different for different people. Keep a food diary to keep track of foods that cause symptoms. ? Alcohol. ? Drinking large amounts of liquid with meals. ? Eating meals during the 2-3 hours before bed.  Cook foods using methods other than frying. This may include baking, grilling, or broiling. Lifestyle  Maintain a healthy weight. Ask your health care provider what weight is healthy for you. If you need to lose weight, work with your health care provider to do so safely.  Exercise for at  least 30 minutes on 5 or more days each week, or as told by your health care provider.  Avoid wearing clothes that fit tightly around your waist and chest.  Do not use any products that contain nicotine or tobacco, such as cigarettes and e-cigarettes. If you need help quitting, ask your health care provider.  Sleep with the head of your bed raised. Use a wedge under the mattress or blocks under the bed frame to raise the head of the bed. What foods are not recommended? The items listed may not be a complete list. Talk with your dietitian about what dietary choices are best for you. Grains Pastries or quick breads with added fat. [place] toast. Vegetables Deep fried vegetables. [place] fries. Any vegetables prepared with added fat. Any vegetables that cause symptoms. For some people this may include tomatoes and tomato products, chili peppers, onions and garlic, and horseradish. Fruits Any fruits prepared with added fat. Any fruits that cause symptoms. For some people this may include citrus fruits, such as oranges, grapefruit, pineapple, and lemons. Meats and other protein foods High-fat meats, such as fatty beef or pork, hot dogs, ribs, ham, sausage, salami and bacon. Fried meat or protein, including fried fish and fried chicken. Nuts and nut butters. Dairy Whole milk and chocolate milk. Sour cream. Cream. Ice cream. Cream cheese. Milk shakes. Beverages Coffee and tea, with or without caffeine. Carbonated beverages. Sodas. Energy drinks. Fruit juice made with acidic fruits (such as orange or grapefruit). Tomato juice. Alcoholic  drinks. Fats and oils Butter. Margarine. Shortening. Ghee. Sweets and desserts Chocolate and cocoa. Donuts. Seasoning and other foods Pepper. Peppermint and spearmint. Any condiments, herbs, or seasonings that cause symptoms. For some people, this may include curry, hot sauce, or vinegar-based salad dressings. Summary  When you have gastroesophageal reflux disease  (GERD), food and lifestyle choices are very important to help ease the discomfort of GERD.  Eat frequent, small meals instead of three large meals each day. Eat your meals slowly, in a relaxed setting. Avoid bending over or lying down until 2-3 hours after eating.  Limit high-fat foods such as fatty meat or fried foods. This information is not intended to replace advice given to you by your health care provider. Make sure you discuss any questions you have with your health care provider. Document Revised: 01/25/2019 Document Reviewed: 10/05/2016 Elsevier Patient Education  [place].    Irritable Bowel Syndrome, Adult  Irritable bowel syndrome (IBS) is a group of symptoms that affects the organs responsible for digestion (gastrointestinal or GI tract). IBS is not one specific disease. To regulate how the GI tract works, the body sends signals back and forth between the intestines and the brain. If you have IBS, there may be a problem with these signals. As a result, the GI tract does not function normally. The intestines may become more sensitive and overreact to certain things. This may be especially true when you eat certain foods or when you are under stress. There are four types of IBS. These may be determined based on the consistency of your stool (feces):  IBS with diarrhea.  IBS with constipation.  Mixed IBS.  Unsubtyped IBS. It is important to know which type of IBS you have. Certain treatments are more likely to be helpful for certain types of IBS. What are the causes? The exact cause of IBS is not known. What increases the risk? You may have a higher risk for IBS if you:  Are female.  Are younger than 63.  Have a family history of IBS.  Have a mental health condition, such as depression, anxiety, or post-traumatic stress disorder.  Have had a bacterial infection of your GI tract. What are the signs or symptoms? Symptoms of IBS vary from person to person. The  main symptom is abdominal pain or discomfort. Other symptoms usually include one or more of the following:  Diarrhea, constipation, or both.  Abdominal swelling or bloating.  Feeling full after eating a small or regular-sized meal.  Frequent gas.  Mucus in the stool.  A feeling of having more stool left after a bowel movement. Symptoms tend to come and go. They may be triggered by stress, mental health conditions, or certain foods. How is this diagnosed? This condition may be diagnosed based on a physical exam, your medical history, and your symptoms. You may have tests, such as:  Blood tests.  Stool test.  X-rays.  CT scan.  Colonoscopy. This is a procedure in which your GI tract is viewed with a long, thin, flexible tube. How is this treated? There is no cure for IBS, but treatment can help relieve symptoms. Treatment depends on the type of IBS you have, and may include:  Changes to your diet, such as: ? Avoiding foods that cause symptoms. ? Drinking more water. ? Following a low-FODMAP (fermentable oligosaccharides, disaccharides, monosaccharides, and polyols) diet for up to 6 weeks, or as told by your health care provider. FODMAPs are sugars that are hard for some  people to digest. ? Eating more fiber. ? Eating medium-sized meals at the same times every day.  Medicines. These may include: ? Fiber supplements, if you have constipation. ? Medicine to control diarrhea (antidiarrheal medicines). ? Medicine to help control muscle tightening (spasms) in your GI tract (antispasmodic medicines). ? Medicines to help with mental health conditions, such as antidepressants or tranquilizers.  Talk therapy or counseling.  Working with a diet and nutrition specialist (dietitian) to help create a food plan that is right for you.  Managing your stress. Follow these instructions at home: Eating and drinking  Eat a healthy diet.  Eat medium-sized meals at about the same time  every day. Do not eat large meals.  Gradually eat more fiber-rich foods. These include whole grains, fruits, and vegetables. This may be especially helpful if you have IBS with constipation.  Eat a diet low in FODMAPs.  Drink enough fluid to keep your urine pale yellow.  Keep a journal of foods that seem to trigger symptoms.  Avoid foods and drinks that: ? Contain added sugar. ? Make your symptoms worse. Dairy products, caffeinated drinks, and carbonated drinks can make symptoms worse for some people. General instructions  Take over-the-counter and prescription medicines and supplements only as told by your health care provider.  Get enough exercise. Do at least 150 minutes of moderate-intensity exercise each week.  Manage your stress. Getting enough sleep and exercise can help you manage stress.  Keep all follow-up visits as told by your health care provider and therapist. This is important. Alcohol Use  Do not drink alcohol if: ? Your health care provider tells you not to drink. ? You are pregnant, may be pregnant, or are planning to become pregnant.  If you drink alcohol, limit how much you have: ? 0-1 drink a day for women. ? 0-2 drinks a day for men.  Be aware of how much alcohol is in your drink. In the [place] one drink equals one typical bottle of beer (12 oz), one-half glass of wine (5 oz), or one shot of hard liquor (1 oz). Contact a health care provider if you have:  Constant pain.  Weight loss.  Difficulty or pain when swallowing.  Diarrhea that gets worse. Get help right away if you have:  Severe abdominal pain.  Fever.  Diarrhea with symptoms of dehydration, such as dizziness or dry mouth.  Bright red blood in your stool.  Stool that is black and tarry.  Abdominal swelling.  Vomiting that does not stop.  Blood in your vomit. Summary  Irritable bowel syndrome (IBS) is not one specific disease. It is a group of symptoms that affects  digestion.  Your intestines may become more sensitive and overreact to certain things. This may be especially true when you eat certain foods or when you are under stress.  There is no cure for IBS, but treatment can help relieve symptoms. This information is not intended to replace advice given to you by your health care provider. Make sure you discuss any questions you have with your health care provider. Document Revised: 09/27/2017 Document Reviewed: 09/27/2017 Elsevier Patient Education  2020 [place].   Diet for Irritable Bowel Syndrome When you have irritable bowel syndrome (IBS), it is very important to eat the foods and follow the eating habits that are best for your condition. IBS may cause various symptoms such as pain in the abdomen, constipation, or diarrhea. Choosing the right foods can help to ease the discomfort from these  symptoms. Work with your health care provider and diet and nutrition specialist (dietitian) to find the eating plan that will help to control your symptoms. What are tips for following this plan?      Keep a food diary. This will help you identify foods that cause symptoms. Write down: ? What you eat and when you eat it. ? What symptoms you have. ? When symptoms occur in relation to your meals, such as "pain in abdomen 2 hours after dinner."  Eat your meals slowly and in a relaxed setting.  Aim to eat 5-6 small meals per day. Do not skip meals.  Drink enough fluid to keep your urine pale yellow.  Ask your health care provider if you should take an over-the-counter probiotic to help restore healthy bacteria in your gut (digestive tract). ? Probiotics are foods that contain good bacteria and yeasts.  Your dietitian may have specific dietary recommendations for you based on your symptoms. He or she may recommend that you: ? Avoid foods that cause symptoms. Talk with your dietitian about other ways to get the same nutrients that are in those problem  foods. ? Avoid foods with gluten. Gluten is a protein that is found in rye, wheat, and barley. ? Eat more foods that contain soluble fiber. Examples of foods with high soluble fiber include oats, seeds, and certain fruits and vegetables. Take a fiber supplement if directed by your dietitian. ? Reduce or avoid certain foods called FODMAPs. These are foods that contain carbohydrates that are hard to digest. Ask your doctor which foods contain these carbohydrates. What foods are not recommended? The following are some foods and drinks that may make your symptoms worse:  Fatty foods, such as french fries.  Foods that contain gluten, such as pasta and cereal.  Dairy products, such as milk, cheese, and ice cream.  Chocolate.  Alcohol.  Products with caffeine, such as coffee.  Carbonated drinks, such as soda.  Foods that are high in FODMAPs. These include certain fruits and vegetables.  Products with sweeteners such as honey, high fructose corn syrup, sorbitol, and mannitol. The items listed above may not be a complete list of foods and beverages you should avoid. Contact a dietitian for more information. What foods are good sources of fiber? Your health care provider or dietitian may recommend that you eat more foods that contain fiber. Fiber can help to reduce constipation and other IBS symptoms. Add foods with fiber to your diet a little at a time so your body can get used to them. Too much fiber at one time might cause gas and swelling of your abdomen. The following are some foods that are good sources of fiber:  Berries, such as raspberries, strawberries, and blueberries.  Tomatoes.  Carrots.  Brown rice.  Oats.  Seeds, such as chia and pumpkin seeds. The items listed above may not be a complete list of recommended sources of fiber. Contact your dietitian for more options. Where to find more information  International Foundation for Functional Gastrointestinal Disorders:  www.iffgd.[employer] of Diabetes and Digestive and Kidney Diseases: [employer] Summary  When you have irritable bowel syndrome (IBS), it is very important to eat the foods and follow the eating habits that are best for your condition.  IBS may cause various symptoms such as pain in the abdomen, constipation, or diarrhea.  Choosing the right foods can help to ease the discomfort that comes from symptoms.  Keep a food diary. This will help you  identify foods that cause symptoms.  Your health care provider or diet and nutrition specialist (dietitian) may recommend that you eat more foods that contain fiber. This information is not intended to replace advice given to you by your health care provider. Make sure you discuss any questions you have with your health care provider. Document Revised: 01/24/2019 Document Reviewed: 06/07/2017 Elsevier Patient Education  [place].

## 2020-07-23 NOTE — Progress Notes (Signed)
Primary Care Physician:  Perlie Mayo, NP  Primary Gastroenterologist:  Garfield Cornea, MD   Chief Complaint  Patient presents with  . Constipation    hurts when she has B  . Bloated    happens w/ or w/o eating. no nausea, no vomiting  . Gastroesophageal Reflux    c/o heartburn, throat burns, worse w/ spicy, acidic foods    HPI:  Lacey Hartman is a 24 y.o. female here at the request of Cherly Beach, NP for further evaluation of GERD, bloating.  Patient states she has had bowel issues most of her life.  Predominance towards constipation as a child.  Diagnosed with IBS at age 9, denies being on medication at that time.  Over the past year she has had worsening symptoms.  Increasing difficulty having a bowel movement.  Strains with each BM, associated with rectal pain. Denies hard stool.  Bowel movement every day to every other day.  Denies blood in the stool or melena.  Brother also has IBS, placed on medication and doing well.  She is concerned about possibility of having Crohn's disease and wants to be checked. Denies weight loss.  Has frequent heartburn, 6 to 7 days/week.  Taking omeprazole after developing symptoms each day.  Has been on in the past but recently started back.  Associated nausea but no vomiting.  No dysphagia.  Complains of postprandial abdominal swelling.  Denies diarrhea.  Maternal grandmother had colon cancer.   Current Outpatient Medications  Medication Sig Dispense Refill  . albuterol (VENTOLIN HFA) 108 (90 Base) MCG/ACT inhaler Inhale 1-2 puffs into the lungs every 4 (four) hours as needed for wheezing or shortness of breath. 18 g 1  . etonogestrel (NEXPLANON) 68 MG IMPL implant 1 each by Subdermal route once.    Marland Kitchen FLUoxetine (PROZAC) 40 MG capsule Take 1 capsule (40 mg total) by mouth daily. 30 capsule 2  . methylphenidate (CONCERTA) 36 MG PO CR tablet Take 1 tablet (36 mg total) by mouth daily. 30 tablet 0  . methylphenidate (CONCERTA) 36 MG PO CR tablet  Take 1 tablet (36 mg total) by mouth daily. 30 tablet 0  . methylphenidate (CONCERTA) 36 MG PO CR tablet Take 1 tablet (36 mg total) by mouth daily. 30 tablet 0  . omeprazole (PRILOSEC) 20 MG capsule Take one tablet daily in the evening. 90 capsule 1  . rizatriptan (MAXALT) 5 MG tablet Take 1 tablet (5 mg total) by mouth as needed for migraine. May repeat in 2 hours if needed 10 tablet 0  . Saccharomyces boulardii (PROBIOTIC) 250 MG CAPS Take 1 capsule by mouth at bedtime. 30 capsule 1   No current facility-administered medications for this visit.    Allergies as of 07/23/2020 - Review Complete 07/23/2020  Allergen Reaction Noted  . Codeine Hives, Swelling, and Rash 11/23/2012    Past Medical History:  Diagnosis Date  . ADHD (attention deficit hyperactivity disorder)   . Anxiety   . Asthma    inhaler prn  . Change in color of skin mole 11/22/2019  . Depression   . Ex-cigarette smoker 11/22/2019  . Hard of hearing   . Right conjunctivitis 08/10/2018  . Smoker 12/03/2013   1-2ppd prior to pregnancy, down to 2cigs/day trying to quit                       01/01/14: 4cigs/day   [facility]   . 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    Past  Surgical History:  Procedure Laterality Date  . NO PAST SURGERIES      Family History  Problem Relation Age of Onset  . Depression Mother   . Anxiety disorder Mother   . Bipolar disorder Mother   . Asthma Mother   . Cancer Maternal Grandmother        breast cancer, lung cancer X 2, colon cancer. Three primary cancers  . Cancer Maternal Uncle        throat  . ADD / ADHD Brother   . Breast cancer Brother   . ADD / ADHD Brother   . Asthma Brother   . Allergic rhinitis Brother   . Cancer Paternal Grandmother        breast  . Angioedema Neg Hx   . Atopy Neg Hx   . Eczema Neg Hx   . Immunodeficiency Neg Hx   . Urticaria Neg Hx     Social History   Socioeconomic History  . Marital status: Significant Other    Spouse name: [name]  .  Number of children: 3  . Years of education: Not on file  . Highest education level: 9th grade  Occupational History  . Not on file  Tobacco Use  . Smoking status: Former Smoker    Packs/day: 0.33    Years: 7.00    Pack years: 2.31    Types: Cigarettes  . Smokeless tobacco: Never Used  Vaping Use  . Vaping Use: Never used  Substance and Sexual Activity  . Alcohol use: No    Alcohol/week: 0.0 standard drinks  . Drug use: No  . Sexual activity: Yes    Birth control/protection: None, Implant  Other Topics Concern  . Not on file  Social History Narrative   Lives with [name] and 3 children   [name]   [name]      Enjoys: playing with kids, going outside, beach, crafts      Diet: does not eat a lot of veggies or fruits, a lot of junk    Caffeine: 15 sodas   Water: 4-5 cups daily      Wears seat belt    Smoke detectors   Does not use phone while driving    Social Determinants of Health   Financial Resource Strain: Low Risk   . Difficulty of Paying Living Expenses: Not hard at all  Food Insecurity: No Food Insecurity  . Worried About Charity fundraiser in the Last Year: Never true  . Ran Out of Food in the Last Year: Never true  Transportation Needs: No Transportation Needs  . Lack of Transportation (Medical): No  . Lack of Transportation (Non-Medical): No  Physical Activity: Inactive  . Days of Exercise per Week: 0 days  . Minutes of Exercise per Session: 0 min  Stress: Stress Concern Present  . Feeling of Stress : To some extent  Social Connections: Moderately Isolated  . Frequency of Communication with Friends and Family: More than three times a week  . Frequency of Social Gatherings with Friends and Family: More than three times a week  . Attends Religious Services: Never  . Active Member of Clubs or Organizations: No  . Attends Archivist Meetings: Never  . Marital Status: Living with partner  Intimate Partner Violence: Not At Risk  . Fear  of Current or Ex-Partner: No  . Emotionally Abused: No  . Physically Abused: No  . Sexually Abused: No      ROS:  General: Negative for anorexia, weight loss, fever, chills, fatigue, weakness. Eyes: Negative for vision changes.  ENT: Negative for hoarseness, difficulty swallowing , nasal congestion. Congenital hearing loss, complete left ear, hearing aid right ear. CV: Negative for chest pain, angina, palpitations, dyspnea on exertion, peripheral edema.  Respiratory: Negative for dyspnea at rest, dyspnea on exertion, cough, sputum, wheezing.  GI: See history of present illness. GU:  Negative for dysuria, hematuria, urinary incontinence, urinary frequency, nocturnal urination.  MS: Negative for joint pain, low back pain.  Derm: Negative for rash or itching.  Neuro: Negative for weakness, abnormal sensation, seizure, frequent headaches, memory loss, confusion.  Psych: Negative for  suicidal ideation, hallucinations. +anxiety/depression Endo: Negative for unusual weight change.  Heme: Negative for bruising or bleeding. Allergy: Negative for rash or hives.    Physical Examination:  BP 118/72   Pulse 83   Temp (!) 97.3 F (36.3 C) (Oral)   Ht 5' (1.524 m)   Wt 167 lb 9.6 oz (76 kg)   LMP 07/18/2020   BMI 32.73 kg/m    General: Well-nourished, well-developed in no acute distress.  Head: Normocephalic, atraumatic.   Eyes: Conjunctiva pink, no icterus. Mouth: masked Neck: Supple without thyromegaly, masses, or lymphadenopathy.  Lungs: Clear to auscultation bilaterally.  Heart: Regular rate and rhythm, no murmurs rubs or gallops.  Abdomen: Bowel sounds are normal, nontender, nondistended, no hepatosplenomegaly or masses, no abdominal bruits or    hernia , no rebound or guarding.   Rectal: not performed Extremities: No lower extremity edema. No clubbing or deformities.  Neuro: Alert and oriented x 4 , grossly normal neurologically.  Skin: Warm and dry, no rash or jaundice.    Psych: Alert and cooperative, normal mood and affect.  Labs: Lab Results  Component Value Date   CREATININE 0.97 07/24/2019   BUN 12 07/24/2019   NA 140 07/24/2019   K 4.1 07/24/2019   CL 105 07/24/2019   CO2 20 07/24/2019   Lab Results  Component Value Date   ALT 21 07/24/2019   AST 19 07/24/2019   ALKPHOS 88 07/24/2019   BILITOT 0.4 07/24/2019   Lab Results  Component Value Date   WBC 8.9 07/24/2019   HGB 13.6 07/24/2019   HCT 42.0 07/24/2019   MCV 92 07/24/2019   PLT 238 07/24/2019     Imaging Studies: No results found.  Impression/Plan:  Pleasant 24 year old female with history of anxiety/depression, hearing loss presenting for further evaluation of GERD, bloating, bowel issues.   States she was diagnosed with IBS at age 71. Has never been on any particular medications. Over the past year having increasing difficulty with a bowel movement, straining and rectal discomfort. Complains of postprandial abdominal bloating. Family history significant for IBS. Patient concerned about possibility of IBD. Although she has a bowel movement most days she has significant straining. Routine labs to be obtained. Check thyroid and celiac. Check inflammatory markers. Start Amitiza 8 mcg daily with food, titrate as tolerated. May require colonoscopy at some point in the near future depending on labs and clinical course. Further recommendations to follow.  GERD: Frequent reflux symptoms. Recommend taking omeprazole 20 mg 30 to 60 minutes before first meal of the day to prevent reflux. Currently she is taking medication after developing symptoms. If she continues to have refractory symptoms she will let me know and we can either increase her medication or change to a different PPI. Reinforced antireflux measures.

## 2020-07-29 ENCOUNTER — Other Ambulatory Visit: Payer: Self-pay | Admitting: Gastroenterology

## 2020-07-29 DIAGNOSIS — K219 Gastro-esophageal reflux disease without esophagitis: Secondary | ICD-10-CM | POA: Diagnosis not present

## 2020-07-29 DIAGNOSIS — R140 Abdominal distension (gaseous): Secondary | ICD-10-CM | POA: Diagnosis not present

## 2020-07-29 DIAGNOSIS — K581 Irritable bowel syndrome with constipation: Secondary | ICD-10-CM | POA: Diagnosis not present

## 2020-07-30 LAB — COMPREHENSIVE METABOLIC PANEL
ALT: 17 IU/L (ref 0–32)
AST: 20 IU/L (ref 0–40)
Albumin/Globulin Ratio: 1.9 (ref 1.2–2.2)
Albumin: 4.4 g/dL (ref 3.9–5.0)
Alkaline Phosphatase: 89 IU/L (ref 44–121)
BUN/Creatinine Ratio: 11 (ref 9–23)
BUN: 9 mg/dL (ref 6–20)
Bilirubin Total: 0.3 mg/dL (ref 0.0–1.2)
CO2: 21 mmol/L (ref 20–29)
Calcium: 9.5 mg/dL (ref 8.7–10.2)
Chloride: 106 mmol/L (ref 96–106)
Creatinine, Ser: 0.8 mg/dL (ref 0.57–1.00)
GFR calc Af Amer: 119 mL/min/{1.73_m2} (ref >59)
GFR calc non Af Amer: 103 mL/min/{1.73_m2} (ref >59)
Globulin, Total: 2.3 g/dL (ref 1.5–4.5)
Glucose: 85 mg/dL (ref 65–99)
Potassium: 4.4 mmol/L (ref 3.5–5.2)
Sodium: 141 mmol/L (ref 134–144)
Total Protein: 6.7 g/dL (ref 6.0–8.5)

## 2020-07-30 LAB — CBC WITH DIFFERENTIAL/PLATELET
Basophils Absolute: 0 10*3/uL (ref 0.0–0.2)
Basos: 0 %
EOS (ABSOLUTE): 0.1 10*3/uL (ref 0.0–0.4)
Eos: 1 %
Hematocrit: 40 % (ref 34.0–46.6)
Hemoglobin: 13 g/dL (ref 11.1–15.9)
Immature Grans (Abs): 0 10*3/uL (ref 0.0–0.1)
Immature Granulocytes: 0 %
Lymphocytes Absolute: 2.1 10*3/uL (ref 0.7–3.1)
Lymphs: 25 %
MCH: 30.1 pg (ref 26.6–33.0)
MCHC: 32.5 g/dL (ref 31.5–35.7)
MCV: 93 fL (ref 79–97)
Monocytes Absolute: 0.4 10*3/uL (ref 0.1–0.9)
Monocytes: 4 %
Neutrophils Absolute: 5.9 10*3/uL (ref 1.4–7.0)
Neutrophils: 70 %
Platelets: 221 10*3/uL (ref 150–450)
RBC: 4.32 x10E6/uL (ref 3.77–5.28)
RDW: 11.9 % (ref 11.7–15.4)
WBC: 8.5 10*3/uL (ref 3.4–10.8)

## 2020-07-30 LAB — TSH: TSH: 1.78 u[IU]/mL (ref 0.450–4.500)

## 2020-07-30 LAB — SEDIMENTATION RATE: Sed Rate: 8 mm/hr (ref 0–32)

## 2020-07-30 LAB — TISSUE TRANSGLUTAMINASE, IGA: Transglutaminase IgA: <2 U/mL (ref 0–3)

## 2020-07-30 LAB — C-REACTIVE PROTEIN: CRP: 8 mg/L (ref 0–10)

## 2020-07-30 LAB — T4: T4, Total: 9.7 ug/dL (ref 4.5–12.0)

## 2020-09-02 ENCOUNTER — Telehealth (INDEPENDENT_AMBULATORY_CARE_PROVIDER_SITE_OTHER): Payer: Medicaid Other | Admitting: Psychiatry

## 2020-09-02 ENCOUNTER — Encounter (HOSPITAL_COMMUNITY): Payer: Self-pay | Admitting: Psychiatry

## 2020-09-02 ENCOUNTER — Other Ambulatory Visit: Payer: Self-pay

## 2020-09-02 DIAGNOSIS — F331 Major depressive disorder, recurrent, moderate: Secondary | ICD-10-CM

## 2020-09-02 DIAGNOSIS — F900 Attention-deficit hyperactivity disorder, predominantly inattentive type: Secondary | ICD-10-CM

## 2020-09-02 MED ORDER — METHYLPHENIDATE HCL ER (OSM) 36 MG PO TBCR: 36.0000 mg | EXTENDED_RELEASE_TABLET | Freq: Every day | ORAL | 0 refills | Status: DC

## 2020-09-02 MED ORDER — FLUOXETINE HCL 40 MG PO CAPS
40.0000 mg | ORAL_CAPSULE | Freq: Every day | ORAL | 2 refills | Status: DC
Start: 2020-09-02 — End: 2020-11-17

## 2020-09-02 NOTE — Progress Notes (Signed)
Virtual Visit via Telephone Note  I connected with Lacey Hartman on 09/02/20 at 10:40 AM EST by telephone and verified that I am speaking with the correct person using two identifiers.  Location: Patient: home Provider: office   I discussed the limitations, risks, security and privacy concerns of performing an evaluation and management service by telephone and the availability of in person appointments. I also discussed with the patient that there may be a patient responsible charge related to this service. The patient expressed understanding and agreed to proceed.     I discussed the assessment and treatment plan with the patient. The patient was provided an opportunity to ask questions and all were answered. The patient agreed with the plan and demonstrated an understanding of the instructions.   The patient was advised to call back or seek an in-person evaluation if the symptoms worsen or if the condition fails to improve as anticipated.  I provided 15 minutes of non-face-to-face time during this encounter.   Levonne Spiller, MD  [facility] MD/PA/NP OP Progress Note  09/02/2020 10:51 AM Lacey Hartman  MRN:  [id number]  Chief Complaint:  Chief Complaint    Depression; ADHD; Follow-up     HPI: This patient is a 24 year old single white female lives with her boyfriend 2 daughters  and one son in [place]. 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  The patient was referred by her primary physician, Dr. Sallee Lange, for further assessment and treatment of ADD and depression.  The patient states that she has always struggled in school. She doesn't know much about her history but does know that she was born full-term but spent most of her first year of life in the NICU at [facility]. She is not sure why. She was diagnosed with hearing loss and required speech therapy although way through school. She had trouble learning to read and write and despite being an exceptional  children's classes still does not read or write very well. When she was 24 years old in [place] she was very hyperactive and unfocused and distractible. At that time she was diagnosed with ADHD and was on Ritalin for a while and later Adderall. She stopped these medications when she became pregnant at age 46.  The patient states that she used to go to [facility] because of depression. The physician there put her on Prozac last year and she is still taking it although she has not been back to that clinic in quite some time. She states that she still has difficulties with bad temper crying spells anger anxiety and mood swings. At times she gets overwhelmed in caring for the children. Her husband has seizures and is on disability so he is able to be at home and help her most of the time. She also gets anxious and nervous around new people. She denies auditory or visual hallucinations or paranoia. She does not use drugs or alcohol. She is now using an Implanon implant to prevent pregnancy as she has had so many children in such a short amount of time.  The patient states she is interested in getting back on medication for ADHD. She's no longer hyperactive but very unfocused distractible and on able to finish tasks  The patient returns for follow-up after 3 months.  She states that she continues to do well.  She denies any symptoms of depression or anxiety.  She is sleeping very well at night.  The Prozac continues to help with her  mood.  She is staying focused with the Concerta.  Her health is good and she has no other complaints Visit Diagnosis:    ICD-10-CM   1. Moderate episode of recurrent major depressive disorder (HCC)  F33.1   2. Attention deficit hyperactivity disorder (ADHD), predominantly inattentive type  F90.0     Past Psychiatric History: none  Past Medical History:  Past Medical History:  Diagnosis Date  . ADHD (attention deficit hyperactivity disorder)   . Anxiety   . Asthma     inhaler prn  . Change in color of skin mole 11/22/2019  . Depression   . Ex-cigarette smoker 11/22/2019  . Hard of hearing   . Right conjunctivitis 08/10/2018  . Smoker 12/03/2013   1-2ppd prior to pregnancy, down to 2cigs/day trying to quit                       01/01/14: 4cigs/day   [facility]   . 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    Past Surgical History:  Procedure Laterality Date  . NO PAST SURGERIES      Family Psychiatric History: see below  Family History:  Family History  Problem Relation Age of Onset  . Depression Mother   . Anxiety disorder Mother   . Bipolar disorder Mother   . Asthma Mother   . Cancer Maternal Grandmother        breast cancer, lung cancer X 2, colon cancer. Three primary cancers  . Cancer Maternal Uncle        throat  . ADD / ADHD Brother   . Breast cancer Brother   . ADD / ADHD Brother   . Asthma Brother   . Allergic rhinitis Brother   . Cancer Paternal Grandmother        breast  . Angioedema Neg Hx   . Atopy Neg Hx   . Eczema Neg Hx   . Immunodeficiency Neg Hx   . Urticaria Neg Hx     Social History:  Social History   Socioeconomic History  . Marital status: Significant Other    Spouse name: [name]  . Number of children: 3  . Years of education: Not on file  . Highest education level: 9th grade  Occupational History  . Not on file  Tobacco Use  . Smoking status: Former Smoker    Packs/day: 0.33    Years: 7.00    Pack years: 2.31    Types: Cigarettes  . Smokeless tobacco: Never Used  Vaping Use  . Vaping Use: Never used  Substance and Sexual Activity  . Alcohol use: No    Alcohol/week: 0.0 standard drinks  . Drug use: No  . Sexual activity: Yes    Birth control/protection: None, Implant  Other Topics Concern  . Not on file  Social History Narrative   Lives with [name] and 3 children   [name]   [name]      Enjoys: playing with kids, going outside, beach, crafts      Diet: does not eat a lot of veggies  or fruits, a lot of junk    Caffeine: 15 sodas   Water: 4-5 cups daily      Wears seat belt    Smoke detectors   Does not use phone while driving    Social Determinants of Health   Financial Resource Strain: Low Risk   . Difficulty of Paying Living Expenses: Not hard at all  Food Insecurity: No  Food Insecurity  . Worried About Charity fundraiser in the Last Year: Never true  . Ran Out of Food in the Last Year: Never true  Transportation Needs: No Transportation Needs  . Lack of Transportation (Medical): No  . Lack of Transportation (Non-Medical): No  Physical Activity: Inactive  . Days of Exercise per Week: 0 days  . Minutes of Exercise per Session: 0 min  Stress: Stress Concern Present  . Feeling of Stress : To some extent  Social Connections: Moderately Isolated  . Frequency of Communication with Friends and Family: More than three times a week  . Frequency of Social Gatherings with Friends and Family: More than three times a week  . Attends Religious Services: Never  . Active Member of Clubs or Organizations: No  . Attends Archivist Meetings: Never  . Marital Status: Living with partner    Allergies:  Allergies  Allergen Reactions  . Codeine Hives, Swelling and Rash    & hallucinations/    Metabolic Disorder Labs: No results found for: HGBA1C, MPG No results found for: PROLACTIN Lab Results  Component Value Date   CHOL 147 07/24/2019   TRIG 73 07/24/2019   HDL 44 07/24/2019   CHOLHDL 3.3 07/24/2019   LDLCALC 89 07/24/2019   LDLCALC 85 02/16/2017   Lab Results  Component Value Date   TSH 1.780 07/29/2020   TSH 1.700 02/16/2017    Therapeutic Level Labs: No results found for: LITHIUM No results found for: VALPROATE No components found for:  CBMZ  Current Medications: Current Outpatient Medications  Medication Sig Dispense Refill  . albuterol (VENTOLIN HFA) 108 (90 Base) MCG/ACT inhaler Inhale 1-2 puffs into the lungs every 4 (four) hours  as needed for wheezing or shortness of breath. 18 g 1  . etonogestrel (NEXPLANON) 68 MG IMPL implant 1 each by Subdermal route once.    Marland Kitchen FLUoxetine (PROZAC) 40 MG capsule Take 1 capsule (40 mg total) by mouth daily. 30 capsule 2  . lubiprostone (AMITIZA) 8 MCG capsule Take 1 capsule (8 mcg total) by mouth daily with breakfast. 30 capsule 1  . methylphenidate (CONCERTA) 36 MG PO CR tablet Take 1 tablet (36 mg total) by mouth daily. 30 tablet 0  . methylphenidate (CONCERTA) 36 MG PO CR tablet Take 1 tablet (36 mg total) by mouth daily. 30 tablet 0  . methylphenidate (CONCERTA) 36 MG PO CR tablet Take 1 tablet (36 mg total) by mouth daily. 30 tablet 0  . omeprazole (PRILOSEC) 20 MG capsule Take one tablet daily in the evening. 90 capsule 1  . rizatriptan (MAXALT) 5 MG tablet Take 1 tablet (5 mg total) by mouth as needed for migraine. May repeat in 2 hours if needed 10 tablet 0  . Saccharomyces boulardii (PROBIOTIC) 250 MG CAPS Take 1 capsule by mouth at bedtime. 30 capsule 1   No current facility-administered medications for this visit.     Musculoskeletal: Strength & Muscle Tone: within normal limits Gait & Station: normal Patient leans: N/A  Psychiatric Specialty Exam: Review of Systems  All other systems reviewed and are negative.   unknown if currently breastfeeding.There is no height or weight on file to calculate BMI.  General Appearance: NA  Eye Contact:  NA  Speech:  Clear and Coherent  Volume:  Normal  Mood:  Euthymic  Affect:  NA  Thought Process:  Goal Directed  Orientation:  Full (Time, Place, and Person)  Thought Content: wnls   Suicidal Thoughts:  No  Homicidal Thoughts:  No  Memory:  Immediate;   Good Recent;   Good Remote;   NA  Judgement:  Good  Insight:  Fair  Psychomotor Activity:  Normal  Concentration:  Concentration: Good and Attention Span: Good  Recall:  Good  Fund of Knowledge: Fair  Language: Good  Akathisia:  No  Handed:  Right  AIMS (if  indicated): not done  Assets:  Communication Skills Desire for Improvement Physical Health Resilience Social Support Talents/Skills  ADL's:  Intact  Cognition: Impaired,  Mild  Sleep:  Good   Screenings: GAD-7     Video Visit from 03/11/2020 in [facility] Office Visit from 12/20/2019 in [facility] Office Visit from 11/22/2019 in [facility]  Total GAD-7 Score 5 18 14     PHQ2-9     Office Visit from 07/18/2020 in [facility] Video Visit from 07/17/2020 in [facility] Video Visit from 03/11/2020 in [facility] Visit from 12/20/2019 in [facility] Visit from 11/22/2019 in [facility]  PHQ-2 Total Score 0 0 0 1 1  PHQ-9 Total Score -- 0 5 18 10        Assessment and Plan: This patient is a 24 year old female with a history of mild cognitive impairment hearing loss depression and ADD.  She continues to do well on her current regimen.  She will continue Prozac 20 mg daily for depression and Concerta 36 mg every morning for ADD.  She will return to see me in 3 months   Levonne Spiller, MD 09/02/2020, 10:51 AM

## 2020-09-12 ENCOUNTER — Telehealth (HOSPITAL_COMMUNITY): Payer: Medicaid Other | Admitting: Psychiatry

## 2020-10-14 ENCOUNTER — Encounter: Payer: Self-pay | Admitting: Nurse Practitioner

## 2020-10-14 ENCOUNTER — Other Ambulatory Visit: Payer: Self-pay

## 2020-10-14 ENCOUNTER — Ambulatory Visit: Payer: Medicaid Other | Admitting: Nurse Practitioner

## 2020-10-14 DIAGNOSIS — H6690 Otitis media, unspecified, unspecified ear: Secondary | ICD-10-CM | POA: Diagnosis not present

## 2020-10-14 MED ORDER — AMOXICILLIN-POT CLAVULANATE 875-125 MG PO TABS: 1.0000 | ORAL_TABLET | Freq: Two times a day (BID) | ORAL | 0 refills | Status: DC

## 2020-10-14 NOTE — Patient Instructions (Addendum)
For your ear pain, I sent in augmentin, an antibiotic that you take by mouth. If your pain does not resolve in 3-4 days, please call the office and we may add ear drops.

## 2020-10-14 NOTE — Assessment & Plan Note (Signed)
-  right TM is injected -painful x 2 weeks -Rx. augmentin

## 2020-10-14 NOTE — Progress Notes (Signed)
Acute Office Visit  Subjective:    Patient ID: Lacey Hartman, female    DOB: August 20, 1996, 24 y.o.   MRN: [id number]  Chief Complaint  Patient presents with  . Otalgia    X 2 weeks     HPI Patient is in today for right ear pain x 2 weeks. She has not noticed a change in hear hearing. She states she has frequent cerumen impactions and ear infections.  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 She wears hearing aids.  Past Medical History:  Diagnosis Date  . ADHD (attention deficit hyperactivity disorder)   . Anxiety   . Asthma    inhaler prn  . Change in color of skin mole 11/22/2019  . Depression   . Ex-cigarette smoker 11/22/2019  . Hard of hearing   . Right conjunctivitis 08/10/2018  . Smoker 12/03/2013   1-2ppd prior to pregnancy, down to 2cigs/day trying to quit                       01/01/14: 4cigs/day   [facility]   . 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    Past Surgical History:  Procedure Laterality Date  . NO PAST SURGERIES      Family History  Problem Relation Age of Onset  . Depression Mother   . Anxiety disorder Mother   . Bipolar disorder Mother   . Asthma Mother   . Cancer Maternal Grandmother        breast cancer, lung cancer X 2, colon cancer. Three primary cancers  . Cancer Maternal Uncle        throat  . ADD / ADHD Brother   . Breast cancer Brother   . ADD / ADHD Brother   . Asthma Brother   . Allergic rhinitis Brother   . Cancer Paternal Grandmother        breast  . Angioedema Neg Hx   . Atopy Neg Hx   . Eczema Neg Hx   . Immunodeficiency Neg Hx   . Urticaria Neg Hx     Social History   Socioeconomic History  . Marital status: Significant Other    Spouse name: Molly Maduro  . Number of children: 3  . Years of education: Not on file  . Highest education level: 9th grade  Occupational History  . Not on file  Tobacco Use  . Smoking status: Former Smoker    Packs/day: 0.33    Years: 7.00    Pack years: 2.31    Types:  Cigarettes  . Smokeless tobacco: Never Used  Vaping Use  . Vaping Use: Never used  Substance and Sexual Activity  . Alcohol use: No    Alcohol/week: 0.0 standard drinks  . Drug use: No  . Sexual activity: Yes    Birth control/protection: None, Implant  Other Topics Concern  . Not on file  Social History Narrative   Lives with Molly Maduro and 3 children   [name]   Nelda Marseille      Enjoys: playing with kids, going outside, beach, crafts      Diet: does not eat a lot of veggies or fruits, a lot of junk    Caffeine: 15 sodas   Water: 4-5 cups daily      Wears seat belt    Smoke detectors   Does not use phone while driving    Social Determinants of Health   Financial Resource Strain:  Low Risk   . Difficulty of Paying Living Expenses: Not hard at all  Food Insecurity: No Food Insecurity  . Worried About Charity fundraiser in the Last Year: Never true  . Ran Out of Food in the Last Year: Never true  Transportation Needs: No Transportation Needs  . Lack of Transportation (Medical): No  . Lack of Transportation (Non-Medical): No  Physical Activity: Inactive  . Days of Exercise per Week: 0 days  . Minutes of Exercise per Session: 0 min  Stress: Stress Concern Present  . Feeling of Stress : To some extent  Social Connections: Moderately Isolated  . Frequency of Communication with Friends and Family: More than three times a week  . Frequency of Social Gatherings with Friends and Family: More than three times a week  . Attends Religious Services: Never  . Active Member of Clubs or Organizations: No  . Attends Archivist Meetings: Never  . Marital Status: Living with partner  Intimate Partner Violence: Not At Risk  . Fear of Current or Ex-Partner: No  . Emotionally Abused: No  . Physically Abused: No  . Sexually Abused: No    Outpatient Medications Prior to Visit  Medication Sig Dispense Refill  . albuterol (VENTOLIN HFA) 108 (90 Base) MCG/ACT inhaler Inhale  1-2 puffs into the lungs every 4 (four) hours as needed for wheezing or shortness of breath. 18 g 1  . etonogestrel (NEXPLANON) 68 MG IMPL implant 1 each by Subdermal route once.    Marland Kitchen FLUoxetine (PROZAC) 40 MG capsule Take 1 capsule (40 mg total) by mouth daily. 30 capsule 2  . lubiprostone (AMITIZA) 8 MCG capsule Take 1 capsule (8 mcg total) by mouth daily with breakfast. 30 capsule 1  . methylphenidate (CONCERTA) 36 MG PO CR tablet Take 1 tablet (36 mg total) by mouth daily. 30 tablet 0  . methylphenidate (CONCERTA) 36 MG PO CR tablet Take 1 tablet (36 mg total) by mouth daily. 30 tablet 0  . methylphenidate (CONCERTA) 36 MG PO CR tablet Take 1 tablet (36 mg total) by mouth daily. 30 tablet 0  . omeprazole (PRILOSEC) 20 MG capsule Take one tablet daily in the evening. 90 capsule 1  . rizatriptan (MAXALT) 5 MG tablet Take 1 tablet (5 mg total) by mouth as needed for migraine. May repeat in 2 hours if needed 10 tablet 0  . Saccharomyces boulardii (PROBIOTIC) 250 MG CAPS Take 1 capsule by mouth at bedtime. 30 capsule 1   No facility-administered medications prior to visit.    Allergies  Allergen Reactions  . Codeine Hives, Swelling and Rash    & hallucinations/    Review of Systems  Constitutional: Negative.   HENT: Positive for ear pain, hearing loss and sinus pain. Negative for ear discharge, sinus pressure and sore throat.        Objective:    Physical Exam Constitutional:      Appearance: Normal appearance.  HENT:     Head: Normocephalic and atraumatic.     Right Ear: Ear canal and external ear normal. No tenderness. There is no impacted cerumen. Tympanic membrane is injected and erythematous. Tympanic membrane is not bulging.  Neurological:     Mental Status: She is alert.     BP 124/83   Pulse 81   Temp 98.7 F (37.1 C)   Resp 18   Ht 5' (1.524 m)   Wt 172 lb (78 kg)   SpO2 98%   BMI 33.59 kg/m  Wt Readings from Last 3 Encounters:  10/14/20 172 lb (78 kg)   07/23/20 167 lb 9.6 oz (76 kg)  07/18/20 166 lb (75.3 kg)    Health Maintenance Due  Topic Date Due  . Hepatitis C Screening  Never done  . COVID-19 Vaccine (1) Never done    There are no preventive care reminders to display for this patient.   Lab Results  Component Value Date   TSH 1.780 07/29/2020   Lab Results  Component Value Date   WBC 8.5 07/29/2020   HGB 13.0 07/29/2020   HCT 40.0 07/29/2020   MCV 93 07/29/2020   PLT 221 07/29/2020   Lab Results  Component Value Date   NA 141 07/29/2020   K 4.4 07/29/2020   CO2 21 07/29/2020   GLUCOSE 85 07/29/2020   BUN 9 07/29/2020   CREATININE 0.80 07/29/2020   BILITOT 0.3 07/29/2020   ALKPHOS 89 07/29/2020   AST 20 07/29/2020   ALT 17 07/29/2020   PROT 6.7 07/29/2020   ALBUMIN 4.4 07/29/2020   CALCIUM 9.5 07/29/2020   Lab Results  Component Value Date   CHOL 147 07/24/2019   Lab Results  Component Value Date   HDL 44 07/24/2019   Lab Results  Component Value Date   LDLCALC 89 07/24/2019   Lab Results  Component Value Date   TRIG 73 07/24/2019   Lab Results  Component Value Date   CHOLHDL 3.3 07/24/2019   No results found for: HGBA1C     Assessment & Plan:   Problem List Items Addressed This Visit      Nervous and Auditory   Otitis media    -right TM is injected -painful x 2 weeks -Rx. augmentin      Relevant Medications   amoxicillin-clavulanate (AUGMENTIN) 875-125 MG tablet       Meds ordered this encounter  Medications  . amoxicillin-clavulanate (AUGMENTIN) 875-125 MG tablet    Sig: Take 1 tablet by mouth 2 (two) times daily.    Dispense:  20 tablet    Refill:  0     Noreene Larsson, NP

## 2020-10-29 ENCOUNTER — Other Ambulatory Visit: Payer: Self-pay

## 2020-10-29 MED ORDER — LUBIPROSTONE 8 MCG PO CAPS: 8.0000 ug | ORAL_CAPSULE | Freq: Every day | ORAL | 3 refills | Status: DC

## 2020-11-17 ENCOUNTER — Encounter (HOSPITAL_COMMUNITY): Payer: Self-pay | Admitting: Psychiatry

## 2020-11-17 ENCOUNTER — Other Ambulatory Visit: Payer: Self-pay

## 2020-11-17 ENCOUNTER — Telehealth (INDEPENDENT_AMBULATORY_CARE_PROVIDER_SITE_OTHER): Payer: Medicaid Other | Admitting: Psychiatry

## 2020-11-17 DIAGNOSIS — F900 Attention-deficit hyperactivity disorder, predominantly inattentive type: Secondary | ICD-10-CM

## 2020-11-17 DIAGNOSIS — F331 Major depressive disorder, recurrent, moderate: Secondary | ICD-10-CM | POA: Diagnosis not present

## 2020-11-17 MED ORDER — FLUOXETINE HCL 40 MG PO CAPS
40.0000 mg | ORAL_CAPSULE | Freq: Every day | ORAL | 2 refills | Status: DC
Start: 2020-11-17 — End: 2021-02-12

## 2020-11-17 MED ORDER — METHYLPHENIDATE HCL ER (OSM) 36 MG PO TBCR: 36.0000 mg | EXTENDED_RELEASE_TABLET | Freq: Every day | ORAL | 0 refills | Status: DC

## 2020-11-17 NOTE — Progress Notes (Signed)
Virtual Visit via Telephone Note  I connected with Lacey Hartman on 11/17/20 at  9:00 AM EST by telephone and verified that I am speaking with the correct person using two identifiers.  Location: Patient: home Provider: home   I discussed the limitations, risks, security and privacy concerns of performing an evaluation and management service by telephone and the availability of in person appointments. I also discussed with the patient that there may be a patient responsible charge related to this service. The patient expressed understanding and agreed to proceed.    I discussed the assessment and treatment plan with the patient. The patient was provided an opportunity to ask questions and all were answered. The patient agreed with the plan and demonstrated an understanding of the instructions.   The patient was advised to call back or seek an in-person evaluation if the symptoms worsen or if the condition fails to improve as anticipated.  I provided 15 minutes of non-face-to-face time during this encounter.   Levonne Spiller, MD  [facility] MD/PA/NP OP Progress Note  11/17/2020 9:22 AM Lacey Hartman  MRN:  [id number]  Chief Complaint:  Chief Complaint    ADHD; Depression; Follow-up     HPI: This patient is a 25 year old single white female lives with her boyfriend 2 daughters and one son in [place]. 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  The patient was referred by her primary physician, Dr. Sallee Lange, for further assessment and treatment of ADD and depression.  The patient states that she has always struggled in school. She doesn't know much about her history but does know that she was born full-term but spent most of her first year of life in the NICU at [facility]. She is not sure why. She was diagnosed with hearing loss and required speech therapy although way through school. She had trouble learning to read and write and despite being an exceptional children's  classes still does not read or write very well. When she was 25 years old in [place] she was very hyperactive and unfocused and distractible. At that time she was diagnosed with ADHD and was on Ritalin for a while and later Adderall. She stopped these medications when she became pregnant at age 89.  The patient states that she used to go to [facility] because of depression. The physician there put her on Prozac last year and she is still taking it although she has not been back to that clinic in quite some time. She states that she still has difficulties with bad temper crying spells anger anxiety and mood swings. At times she gets overwhelmed in caring for the children. Her husband has seizures and is on disability so he is able to be at home and help her most of the time. She also gets anxious and nervous around new people. She denies auditory or visual hallucinations or paranoia. She does not use drugs or alcohol. She is now using an Implanon implant to prevent pregnancy as she has had so many children in such a short amount of time.  The patient states she is interested in getting back on medication for ADHD. She's no longer hyperactive but very unfocused distractible and on able to finish tasks  The patient returns after 3 months.  She states that for the most part she is doing well.  She denies being depressed and the Concerta continues to help her with focus.  She is struggling to discipline her 55-year-old son.  She  states that he manipulates by going to his father to undermine her authority.  I explained to her that this had to stop her they were going to create a child who is very entitled and difficult to manage.  She is going to talk to her boyfriend about this.  Other than that she is doing well and denies any thoughts of self-harm or suicidal ideation. Visit Diagnosis:    ICD-10-CM   1. Moderate episode of recurrent major depressive disorder (HCC)  F33.1   2. Attention deficit hyperactivity  disorder (ADHD), predominantly inattentive type  F90.0     Past Psychiatric History: none  Past Medical History:  Past Medical History:  Diagnosis Date  . ADHD (attention deficit hyperactivity disorder)   . Anxiety   . Asthma    inhaler prn  . Change in color of skin mole 11/22/2019  . Depression   . Ex-cigarette smoker 11/22/2019  . Hard of hearing   . Right conjunctivitis 08/10/2018  . Smoker 12/03/2013   1-2ppd prior to pregnancy, down to 2cigs/day trying to quit                       01/01/14: 4cigs/day   [facility]   . 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    Past Surgical History:  Procedure Laterality Date  . NO PAST SURGERIES      Family Psychiatric History: see below  Family History:  Family History  Problem Relation Age of Onset  . Depression Mother   . Anxiety disorder Mother   . Bipolar disorder Mother   . Asthma Mother   . Cancer Maternal Grandmother        breast cancer, lung cancer X 2, colon cancer. Three primary cancers  . Cancer Maternal Uncle        throat  . ADD / ADHD Brother   . Breast cancer Brother   . ADD / ADHD Brother   . Asthma Brother   . Allergic rhinitis Brother   . Cancer Paternal Grandmother        breast  . Angioedema Neg Hx   . Atopy Neg Hx   . Eczema Neg Hx   . Immunodeficiency Neg Hx   . Urticaria Neg Hx     Social History:  Social History   Socioeconomic History  . Marital status: Significant Other    Spouse name: [name]  . Number of children: 3  . Years of education: Not on file  . Highest education level: 9th grade  Occupational History  . Not on file  Tobacco Use  . Smoking status: Former Smoker    Packs/day: 0.33    Years: 7.00    Pack years: 2.31    Types: Cigarettes  . Smokeless tobacco: Never Used  Vaping Use  . Vaping Use: Never used  Substance and Sexual Activity  . Alcohol use: No    Alcohol/week: 0.0 standard drinks  . Drug use: No  . Sexual activity: Yes    Birth control/protection: None,  Implant  Other Topics Concern  . Not on file  Social History Narrative   Lives with [name] and 3 children   [name]   [name]      Enjoys: playing with kids, going outside, beach, crafts      Diet: does not eat a lot of veggies or fruits, a lot of junk    Caffeine: 15 sodas   Water: 4-5 cups daily  Wears seat belt    Smoke detectors   Does not use phone while driving    Social Determinants of Health   Financial Resource Strain: Low Risk   . Difficulty of Paying Living Expenses: Not hard at all  Food Insecurity: No Food Insecurity  . Worried About Charity fundraiser in the Last Year: Never true  . Ran Out of Food in the Last Year: Never true  Transportation Needs: No Transportation Needs  . Lack of Transportation (Medical): No  . Lack of Transportation (Non-Medical): No  Physical Activity: Inactive  . Days of Exercise per Week: 0 days  . Minutes of Exercise per Session: 0 min  Stress: Stress Concern Present  . Feeling of Stress : To some extent  Social Connections: Moderately Isolated  . Frequency of Communication with Friends and Family: More than three times a week  . Frequency of Social Gatherings with Friends and Family: More than three times a week  . Attends Religious Services: Never  . Active Member of Clubs or Organizations: No  . Attends Archivist Meetings: Never  . Marital Status: Living with partner    Allergies:  Allergies  Allergen Reactions  . Codeine Hives, Swelling and Rash    & hallucinations/    Metabolic Disorder Labs: No results found for: HGBA1C, MPG No results found for: PROLACTIN Lab Results  Component Value Date   CHOL 147 07/24/2019   TRIG 73 07/24/2019   HDL 44 07/24/2019   CHOLHDL 3.3 07/24/2019   LDLCALC 89 07/24/2019   LDLCALC 85 02/16/2017   Lab Results  Component Value Date   TSH 1.780 07/29/2020   TSH 1.700 02/16/2017    Therapeutic Level Labs: No results found for: LITHIUM No results found  for: VALPROATE No components found for:  CBMZ  Current Medications: Current Outpatient Medications  Medication Sig Dispense Refill  . albuterol (VENTOLIN HFA) 108 (90 Base) MCG/ACT inhaler Inhale 1-2 puffs into the lungs every 4 (four) hours as needed for wheezing or shortness of breath. 18 g 1  . amoxicillin-clavulanate (AUGMENTIN) 875-125 MG tablet Take 1 tablet by mouth 2 (two) times daily. 20 tablet 0  . etonogestrel (NEXPLANON) 68 MG IMPL implant 1 each by Subdermal route once.    Marland Kitchen FLUoxetine (PROZAC) 40 MG capsule Take 1 capsule (40 mg total) by mouth daily. 30 capsule 2  . lubiprostone (AMITIZA) 8 MCG capsule Take 1 capsule (8 mcg total) by mouth daily with breakfast. 60 capsule 3  . methylphenidate (CONCERTA) 36 MG PO CR tablet Take 1 tablet (36 mg total) by mouth daily. 30 tablet 0  . methylphenidate (CONCERTA) 36 MG PO CR tablet Take 1 tablet (36 mg total) by mouth daily. 30 tablet 0  . methylphenidate (CONCERTA) 36 MG PO CR tablet Take 1 tablet (36 mg total) by mouth daily. 30 tablet 0  . omeprazole (PRILOSEC) 20 MG capsule Take one tablet daily in the evening. 90 capsule 1  . rizatriptan (MAXALT) 5 MG tablet Take 1 tablet (5 mg total) by mouth as needed for migraine. May repeat in 2 hours if needed 10 tablet 0  . Saccharomyces boulardii (PROBIOTIC) 250 MG CAPS Take 1 capsule by mouth at bedtime. 30 capsule 1   No current facility-administered medications for this visit.     Musculoskeletal: Strength & Muscle Tone: within normal limits Gait & Station: normal Patient leans: N/A  Psychiatric Specialty Exam: Review of Systems  HENT: Positive for hearing loss.   All other  systems reviewed and are negative.   unknown if currently breastfeeding.There is no height or weight on file to calculate BMI.  General Appearance: NA  Eye Contact:  NA  Speech:  Clear and Coherent  Volume:  Normal  Mood:  Euthymic  Affect:  NA  Thought Process:  Goal Directed  Orientation:  Full  (Time, Place, and Person)  Thought Content: Rumination   Suicidal Thoughts:  No  Homicidal Thoughts:  No  Memory:  Immediate;   Good Recent;   Good Remote;   Fair  Judgement:  Good  Insight:  Fair  Psychomotor Activity:  Normal  Concentration:  Concentration: Good and Attention Span: Good  Recall:  [place] of Knowledge: Fair  Language: Good  Akathisia:  No  Handed:  Right  AIMS (if indicated): not done  Assets:  Communication Skills Desire for Improvement Physical Health Resilience Social Support Talents/Skills  ADL's:  Intact  Cognition: Impaired,  Mild  Sleep:  Good   Screenings: GAD-7   Flowsheet Row Video Visit from 03/11/2020 in [facility] Office Visit from 12/20/2019 in [facility] Office Visit from 11/22/2019 in [facility]  Total GAD-7 Score 5 18 14     PHQ2-9   [place] from 10/14/2020 in [facility] Office Visit from 07/18/2020 in [facility] Video Visit from 07/17/2020 in [facility] Visit from 03/11/2020 in [facility] Visit from 12/20/2019 in [facility]  PHQ-2 Total Score 0 0 0 0 1  PHQ-9 Total Score - - 0 5 18       Assessment and Plan: This patient is a 25 year old female with a history of mild cognitive impairment, hearing loss depression and ADD.  She continues to do well on her current regimen.  She will continue Prozac 40 mg daily for depression and Concerta 36 mg every morning for ADD.  She will return to see me in 3 months   Levonne Spiller, MD 11/17/2020, 9:22 AM

## 2020-11-20 ENCOUNTER — Ambulatory Visit (INDEPENDENT_AMBULATORY_CARE_PROVIDER_SITE_OTHER): Payer: Medicaid Other | Admitting: Nurse Practitioner

## 2020-11-20 ENCOUNTER — Encounter: Payer: Self-pay | Admitting: Nurse Practitioner

## 2020-11-20 ENCOUNTER — Other Ambulatory Visit: Payer: Self-pay

## 2020-11-20 VITALS — BP 116/80 | HR 84 | Temp 98.6°F | Resp 20 | Ht 60.0 in | Wt 177.0 lb

## 2020-11-20 DIAGNOSIS — Z0000 Encounter for general adult medical examination without abnormal findings: Secondary | ICD-10-CM

## 2020-11-20 DIAGNOSIS — Z139 Encounter for screening, unspecified: Secondary | ICD-10-CM | POA: Diagnosis not present

## 2020-11-20 DIAGNOSIS — N898 Other specified noninflammatory disorders of vagina: Secondary | ICD-10-CM | POA: Diagnosis not present

## 2020-11-20 DIAGNOSIS — K219 Gastro-esophageal reflux disease without esophagitis: Secondary | ICD-10-CM | POA: Diagnosis not present

## 2020-11-20 DIAGNOSIS — R300 Dysuria: Secondary | ICD-10-CM

## 2020-11-20 DIAGNOSIS — J45909 Unspecified asthma, uncomplicated: Secondary | ICD-10-CM

## 2020-11-20 MED ORDER — OMEPRAZOLE 20 MG PO CPDR: DELAYED_RELEASE_CAPSULE | ORAL | 1 refills | Status: DC

## 2020-11-20 MED ORDER — NITROFURANTOIN MONOHYD MACRO 100 MG PO CAPS: 100.0000 mg | ORAL_CAPSULE | Freq: Two times a day (BID) | ORAL | 0 refills | Status: DC

## 2020-11-20 MED ORDER — ALBUTEROL SULFATE HFA 108 (90 BASE) MCG/ACT IN AERS: 1.0000 | INHALATION_SPRAY | RESPIRATORY_TRACT | 1 refills | Status: DC | PRN

## 2020-11-20 NOTE — Assessment & Plan Note (Signed)
-  no issues today -refilled albuterol

## 2020-11-20 NOTE — Assessment & Plan Note (Signed)
-  well controlled -refilled omeprazole

## 2020-11-20 NOTE — Progress Notes (Signed)
Established Patient Office Visit  Subjective:  Patient ID: Lacey Hartman, female    DOB: October 20, 1995  Age: 25 y.o. MRN: [id number]  CC:  Chief Complaint  Patient presents with  . Annual Exam    CPE     HPI Lacey Hartman presents for physical exam and dysuria.  She states she has had dysuria for about a week.  Past Medical History:  Diagnosis Date  . ADHD (attention deficit hyperactivity disorder)   . Anxiety   . Asthma    inhaler prn  . Change in color of skin mole 11/22/2019  . Depression   . Ex-cigarette smoker 11/22/2019  . Hard of hearing   . Right conjunctivitis 08/10/2018  . Smoker 12/03/2013   1-2ppd prior to pregnancy, down to 2cigs/day trying to quit                       01/01/14: 4cigs/day   [facility]   . 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    Past Surgical History:  Procedure Laterality Date  . NO PAST SURGERIES      Family History  Problem Relation Age of Onset  . Depression Mother   . Anxiety disorder Mother   . Bipolar disorder Mother   . Asthma Mother   . Cancer Maternal Grandmother        breast cancer, lung cancer X 2, colon cancer. Three primary cancers  . Cancer Maternal Uncle        throat  . ADD / ADHD Brother   . Breast cancer Brother   . ADD / ADHD Brother   . Asthma Brother   . Allergic rhinitis Brother   . Cancer Paternal Grandmother        breast  . Angioedema Neg Hx   . Atopy Neg Hx   . Eczema Neg Hx   . Immunodeficiency Neg Hx   . Urticaria Neg Hx     Social History   Socioeconomic History  . Marital status: Significant Other    Spouse name: [name]  . Number of children: 3  . Years of education: Not on file  . Highest education level: 9th grade  Occupational History  . Not on file  Tobacco Use  . Smoking status: Former Smoker    Packs/day: 0.33    Years: 7.00    Pack years: 2.31    Types: Cigarettes  . Smokeless tobacco: Never Used  Vaping Use  . Vaping Use: Never used  Substance and Sexual Activity  .  Alcohol use: No    Alcohol/week: 0.0 standard drinks  . Drug use: No  . Sexual activity: Yes    Birth control/protection: None, Implant  Other Topics Concern  . Not on file  Social History Narrative   Lives with [name] and 3 children   [name]   [name]      Enjoys: playing with kids, going outside, beach, crafts      Diet: does not eat a lot of veggies or fruits, a lot of junk    Caffeine: 15 sodas   Water: 4-5 cups daily      Wears seat belt    Smoke detectors   Does not use phone while driving    Social Determinants of Health   Financial Resource Strain: Low Risk   . Difficulty of Paying Living Expenses: Not hard at all  Food Insecurity: No Food Insecurity  . Worried About Charity fundraiser  in the Last Year: Never true  . Ran Out of Food in the Last Year: Never true  Transportation Needs: No Transportation Needs  . Lack of Transportation (Medical): No  . Lack of Transportation (Non-Medical): No  Physical Activity: Inactive  . Days of Exercise per Week: 0 days  . Minutes of Exercise per Session: 0 min  Stress: Stress Concern Present  . Feeling of Stress : To some extent  Social Connections: Moderately Isolated  . Frequency of Communication with Friends and Family: More than three times a week  . Frequency of Social Gatherings with Friends and Family: More than three times a week  . Attends Religious Services: Never  . Active Member of Clubs or Organizations: No  . Attends Archivist Meetings: Never  . Marital Status: Living with partner  Intimate Partner Violence: Not At Risk  . Fear of Current or Ex-Partner: No  . Emotionally Abused: No  . Physically Abused: No  . Sexually Abused: No    Outpatient Medications Prior to Visit  Medication Sig Dispense Refill  . etonogestrel (NEXPLANON) 68 MG IMPL implant 1 each by Subdermal route once.    Marland Kitchen FLUoxetine (PROZAC) 40 MG capsule Take 1 capsule (40 mg total) by mouth daily. 30 capsule 2  .  lubiprostone (AMITIZA) 8 MCG capsule Take 1 capsule (8 mcg total) by mouth daily with breakfast. 60 capsule 3  . methylphenidate (CONCERTA) 36 MG PO CR tablet Take 1 tablet (36 mg total) by mouth daily. 30 tablet 0  . rizatriptan (MAXALT) 5 MG tablet Take 1 tablet (5 mg total) by mouth as needed for migraine. May repeat in 2 hours if needed 10 tablet 0  . Saccharomyces boulardii (PROBIOTIC) 250 MG CAPS Take 1 capsule by mouth at bedtime. 30 capsule 1  . albuterol (VENTOLIN HFA) 108 (90 Base) MCG/ACT inhaler Inhale 1-2 puffs into the lungs every 4 (four) hours as needed for wheezing or shortness of breath. 18 g 1  . amoxicillin-clavulanate (AUGMENTIN) 875-125 MG tablet Take 1 tablet by mouth 2 (two) times daily. 20 tablet 0  . methylphenidate (CONCERTA) 36 MG PO CR tablet Take 1 tablet (36 mg total) by mouth daily. 30 tablet 0  . methylphenidate (CONCERTA) 36 MG PO CR tablet Take 1 tablet (36 mg total) by mouth daily. 30 tablet 0  . omeprazole (PRILOSEC) 20 MG capsule Take one tablet daily in the evening. 90 capsule 1   No facility-administered medications prior to visit.    Allergies  Allergen Reactions  . Codeine Hives, Swelling and Rash    & hallucinations/    ROS Review of Systems  Constitutional: Negative.   HENT: Negative.   Eyes: Negative.   Respiratory: Negative.   Cardiovascular: Negative.   Gastrointestinal: Negative.   Endocrine: Negative.   Genitourinary: Positive for dysuria.       Has a foul odor all the time, unsure if urine or vaginal  Musculoskeletal: Negative.   Skin: Negative.   Allergic/Immunologic: Negative.   Neurological: Negative.   Hematological: Negative.   Psychiatric/Behavioral: Negative.       Objective:    Physical Exam Constitutional:      Appearance: Normal appearance.  HENT:     Head: Normocephalic and atraumatic.     Right Ear: Tympanic membrane, ear canal and external ear normal.     Left Ear: Tympanic membrane, ear canal and external  ear normal.     Nose: Nose normal.     Mouth/Throat:  Mouth: Mucous membranes are moist.     Pharynx: Oropharynx is clear.  Eyes:     Extraocular Movements: Extraocular movements intact.     Conjunctiva/sclera: Conjunctivae normal.     Pupils: Pupils are equal, round, and reactive to light.  Cardiovascular:     Rate and Rhythm: Normal rate and regular rhythm.     Pulses: Normal pulses.     Heart sounds: Normal heart sounds.  Pulmonary:     Effort: Pulmonary effort is normal.     Breath sounds: Normal breath sounds.  Abdominal:     Tenderness: There is abdominal tenderness.     Comments: Over bladder  Musculoskeletal:        General: Normal range of motion.     Cervical back: Normal range of motion and neck supple.  Skin:    General: Skin is warm and dry.  Neurological:     General: No focal deficit present.     Mental Status: She is alert. She is disoriented.  Psychiatric:        Mood and Affect: Mood normal.        Behavior: Behavior normal.     BP 116/80   Pulse 84   Temp 98.6 F (37 C)   Resp 20   Ht 5' (1.524 m)   Wt 177 lb (80.3 kg)   SpO2 98%   BMI 34.57 kg/m  Wt Readings from Last 3 Encounters:  11/20/20 177 lb (80.3 kg)  10/14/20 172 lb (78 kg)  07/23/20 167 lb 9.6 oz (76 kg)     Health Maintenance Due  Topic Date Due  . Hepatitis C Screening  Never done    There are no preventive care reminders to display for this patient.  Lab Results  Component Value Date   TSH 1.780 07/29/2020   Lab Results  Component Value Date   WBC 8.5 07/29/2020   HGB 13.0 07/29/2020   HCT 40.0 07/29/2020   MCV 93 07/29/2020   PLT 221 07/29/2020   Lab Results  Component Value Date   NA 141 07/29/2020   K 4.4 07/29/2020   CO2 21 07/29/2020   GLUCOSE 85 07/29/2020   BUN 9 07/29/2020   CREATININE 0.80 07/29/2020   BILITOT 0.3 07/29/2020   ALKPHOS 89 07/29/2020   AST 20 07/29/2020   ALT 17 07/29/2020   PROT 6.7 07/29/2020   ALBUMIN 4.4 07/29/2020    CALCIUM 9.5 07/29/2020   Lab Results  Component Value Date   CHOL 147 07/24/2019   Lab Results  Component Value Date   HDL 44 07/24/2019   Lab Results  Component Value Date   LDLCALC 89 07/24/2019   Lab Results  Component Value Date   TRIG 73 07/24/2019   Lab Results  Component Value Date   CHOLHDL 3.3 07/24/2019   No results found for: HGBA1C    Assessment & Plan:   Problem List Items Addressed This Visit      Respiratory   Mild asthma    -no issues today -refilled albuterol      Relevant Medications   albuterol (VENTOLIN HFA) 108 (90 Base) MCG/ACT inhaler     Digestive   GERD (gastroesophageal reflux disease)    -well controlled -refilled omeprazole      Relevant Medications   omeprazole (PRILOSEC) 20 MG capsule     Other   Dysuria    -started a week ago -no fever or CVA tenderness -Rx. macrobid  Relevant Medications   nitrofurantoin, macrocrystal-monohydrate, (MACROBID) 100 MG capsule   Other Relevant Orders   Urine Culture   Vaginal odor    -she is unsure if this is related to her urinary issues -will check nuswab to r/o BV      Relevant Orders   NuSwab Vaginitis Plus (VG+)    Other Visit Diagnoses    Preventative health care    -  Primary   Relevant Orders   CBC with Differential/Platelet   CMP14+EGFR   Lipid Panel With LDL/HDL Ratio   HCV Ab w/Rflx to Verification   Screening due       Relevant Orders   HCV Ab w/Rflx to Verification      Meds ordered this encounter  Medications  . nitrofurantoin, macrocrystal-monohydrate, (MACROBID) 100 MG capsule    Sig: Take 1 capsule (100 mg total) by mouth 2 (two) times daily.    Dispense:  14 capsule    Refill:  0  . omeprazole (PRILOSEC) 20 MG capsule    Sig: Take one tablet daily in the evening.    Dispense:  90 capsule    Refill:  1  . albuterol (VENTOLIN HFA) 108 (90 Base) MCG/ACT inhaler    Sig: Inhale 1-2 puffs into the lungs every 4 (four) hours as needed for wheezing or  shortness of breath.    Dispense:  18 g    Refill:  1    Follow-up: Return in about 6 months (around 05/20/2021) for Lab follow-up.    Noreene Larsson, NP

## 2020-11-20 NOTE — Assessment & Plan Note (Signed)
-  she is unsure if this is related to her urinary issues -will check nuswab to r/o BV

## 2020-11-20 NOTE — Assessment & Plan Note (Signed)
-  started a week ago -no fever or CVA tenderness -Rx. macrobid

## 2020-11-22 LAB — URINE CULTURE

## 2020-11-24 LAB — NUSWAB VAGINITIS PLUS (VG+)
Candida albicans, NAA: NEGATIVE
Candida glabrata, NAA: NEGATIVE
Chlamydia trachomatis, NAA: NEGATIVE
Neisseria gonorrhoeae, NAA: NEGATIVE
Trich vag by NAA: NEGATIVE

## 2020-11-24 NOTE — Progress Notes (Signed)
U/A and vaginal swab were negative for UTI, BV, or yeast infection.

## 2020-11-26 ENCOUNTER — Encounter: Payer: Medicaid Other | Admitting: Family Medicine

## 2020-12-25 DIAGNOSIS — Z6834 Body mass index (BMI) 34.0-34.9, adult: Secondary | ICD-10-CM | POA: Diagnosis not present

## 2020-12-25 DIAGNOSIS — Z0000 Encounter for general adult medical examination without abnormal findings: Secondary | ICD-10-CM | POA: Diagnosis not present

## 2020-12-25 LAB — HM PAP SMEAR: HM Pap smear: NORMAL

## 2021-01-14 DIAGNOSIS — Z3046 Encounter for surveillance of implantable subdermal contraceptive: Secondary | ICD-10-CM | POA: Diagnosis not present

## 2021-02-01 ENCOUNTER — Other Ambulatory Visit: Payer: Self-pay

## 2021-02-01 ENCOUNTER — Emergency Department (HOSPITAL_COMMUNITY)
Admission: EM | Admit: 2021-02-01 | Discharge: 2021-02-01 | Disposition: A | Discharge status: Home / Self Care | Payer: Medicaid Other | Attending: Emergency Medicine | Admitting: Emergency Medicine

## 2021-02-01 ENCOUNTER — Encounter (HOSPITAL_COMMUNITY): Payer: Self-pay | Admitting: *Deleted

## 2021-02-01 DIAGNOSIS — Z87891 Personal history of nicotine dependence: Secondary | ICD-10-CM | POA: Diagnosis not present

## 2021-02-01 DIAGNOSIS — H6021 Malignant otitis externa, right ear: Secondary | ICD-10-CM | POA: Insufficient documentation

## 2021-02-01 DIAGNOSIS — J4520 Mild intermittent asthma, uncomplicated: Secondary | ICD-10-CM | POA: Diagnosis not present

## 2021-02-01 DIAGNOSIS — H9201 Otalgia, right ear: Secondary | ICD-10-CM | POA: Diagnosis present

## 2021-02-01 DIAGNOSIS — H6020 Malignant otitis externa, unspecified ear: Secondary | ICD-10-CM

## 2021-02-01 MED ORDER — CIPROFLOXACIN-DEXAMETHASONE 0.3-0.1 % OT SUSP
4.0000 [drp] | Freq: Two times a day (BID) | OTIC | 0 refills | Status: DC
Start: 2021-02-01 — End: 2021-05-20

## 2021-02-01 NOTE — ED Provider Notes (Signed)
[facility] EMERGENCY DEPARTMENT Provider Note   CSN: [id number] Arrival date & time: 02/01/21  2116     History Chief Complaint  Patient presents with  . Otalgia    Lacey Hartman is a 25 y.o. female.  Patient with right earache.  Patient wears a hearing aid in that ear  The history is provided by the patient and medical records. No language interpreter was used.  Otalgia Location:  Right Behind ear:  No abnormality Quality:  Aching Severity:  Moderate Onset quality:  Sudden Timing:  Constant Progression:  Worsening Chronicity:  New Relieved by:  Nothing Worsened by:  Nothing Ineffective treatments:  None tried Associated symptoms: no abdominal pain, no congestion, no cough, no diarrhea, no ear discharge, no headaches and no rash        Past Medical History:  Diagnosis Date  . ADHD (attention deficit hyperactivity disorder)   . Anxiety   . Asthma    inhaler prn  . Change in color of skin mole 11/22/2019  . Depression   . Ex-cigarette smoker 11/22/2019  . Hard of hearing   . Right conjunctivitis 08/10/2018  . Smoker 12/03/2013   1-2ppd prior to pregnancy, down to 2cigs/day trying to quit                       01/01/14: 4cigs/day   [place] [facility]   . 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    Patient Active Problem List   Diagnosis Date Noted  . Dysuria 11/20/2020  . Vaginal odor 11/20/2020  . Otitis media 10/14/2020  . IBS (irritable bowel syndrome) 07/23/2020  . Impacted cerumen of right ear 07/18/2020  . Bloated abdomen 06/11/2020  . Axillary tenderness, left 12/20/2019  . Breast tenderness in female 12/20/2019  . Lump in lower inner quadrant of right breast 12/20/2019  . Family history of breast cancer 12/20/2019  . 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 GERD (gastroesophageal reflux disease) 11/22/2019  . Cluster headache, not intractable 11/22/2019  . Psychophysiological insomnia 11/22/2019  . Anxiety 11/22/2019  . Mild asthma 11/22/2019  . Obesity (BMI  30-39.9) 11/22/2019  . Depression 06/10/2016  . ADD (attention deficit disorder) 12/24/2015    Past Surgical History:  Procedure Laterality Date  . NO PAST SURGERIES       OB History    Gravida  3   Para  2   Term  1   Preterm  1   AB  1   Living  2     SAB  1   IAB  0   Ectopic  0   Multiple  0   Live Births  2           Family History  Problem Relation Age of Onset  . Depression Mother   . Anxiety disorder Mother   . Bipolar disorder Mother   . Asthma Mother   . Cancer Maternal Grandmother        breast cancer, lung cancer X 2, colon cancer. Three primary cancers  . Cancer Maternal Uncle        throat  . ADD / ADHD Brother   . Breast cancer Brother   . ADD / ADHD Brother   . Asthma Brother   . Allergic rhinitis Brother   . Cancer Paternal Grandmother        breast  . Angioedema Neg Hx   . Atopy Neg Hx   . Eczema Neg Hx   . Immunodeficiency Neg Hx   .  Urticaria Neg Hx     Social History   Tobacco Use  . Smoking status: Former Smoker    Packs/day: 0.33    Years: 7.00    Pack years: 2.31    Types: Cigarettes  . Smokeless tobacco: Never Used  Vaping Use  . Vaping Use: Never used  Substance Use Topics  . Alcohol use: No    Alcohol/week: 0.0 standard drinks  . Drug use: No    Home Medications Prior to Admission medications   Medication Sig Start Date End Date Taking? Authorizing Provider  ciprofloxacin-dexamethasone (CIPRODEX) OTIC suspension Place 4 drops into the right ear 2 (two) times daily. 02/01/21  Yes Milton Ferguson, MD  albuterol (VENTOLIN HFA) 108 (90 Base) MCG/ACT inhaler Inhale 1-2 puffs into the lungs every 4 (four) hours as needed for wheezing or shortness of breath. 11/20/20   Noreene Larsson, NP  etonogestrel (NEXPLANON) 68 MG IMPL implant 1 each by Subdermal route once.    [provider]  FLUoxetine (PROZAC) 40 MG capsule Take 1 capsule (40 mg total) by mouth daily. 11/17/20   Cloria Spring, MD  lubiprostone  [facility]) 8 MCG capsule Take 1 capsule (8 mcg total) by mouth daily with breakfast. 10/29/20   Annitta Needs, NP  methylphenidate (CONCERTA) 36 MG PO CR tablet Take 1 tablet (36 mg total) by mouth daily. 11/17/20 11/17/21  Cloria Spring, MD  nitrofurantoin, macrocrystal-monohydrate, (MACROBID) 100 MG capsule Take 1 capsule (100 mg total) by mouth 2 (two) times daily. 11/20/20   Noreene Larsson, NP  omeprazole (PRILOSEC) 20 MG capsule Take one tablet daily in the evening. 11/20/20   Noreene Larsson, NP  rizatriptan (MAXALT) 5 MG tablet Take 1 tablet (5 mg total) by mouth as needed for migraine. May repeat in 2 hours if needed 11/22/19   Perlie Mayo, NP  Saccharomyces boulardii (PROBIOTIC) 250 MG CAPS Take 1 capsule by mouth at bedtime. 11/22/19   Perlie Mayo, NP    Allergies    Codeine  Review of Systems   Review of Systems  Constitutional: Negative for appetite change and fatigue.  HENT: Positive for ear pain. Negative for congestion, ear discharge and sinus pressure.   Eyes: Negative for discharge.  Respiratory: Negative for cough.   Cardiovascular: Negative for chest pain.  Gastrointestinal: Negative for abdominal pain and diarrhea.  Genitourinary: Negative for frequency and hematuria.  Musculoskeletal: Negative for back pain.  Skin: Negative for rash.  Neurological: Negative for seizures and headaches.  Psychiatric/Behavioral: Negative for hallucinations.    Physical Exam Updated Vital Signs BP 120/75 (BP Location: Left Arm)   Pulse 89   Temp 98.3 F (36.8 C) (Oral)   Resp 16   Ht 5\' 1"  (1.549 m)   Wt 83.9 kg   LMP  (LMP Unknown)   SpO2 100%   BMI 34.96 kg/m   Physical Exam Vitals and nursing note reviewed.  Constitutional:      Appearance: She is well-developed.  HENT:     Head: Normocephalic.     Ears:     Comments: Right TM shows external otitis    Nose: Nose normal.  Eyes:     General: No scleral icterus.    Conjunctiva/sclera: Conjunctivae normal.  Neck:      Thyroid: No thyromegaly.  Cardiovascular:     Rate and Rhythm: Normal rate and regular rhythm.     Heart sounds: No murmur heard. No friction rub. No gallop.   Pulmonary:  Breath sounds: No stridor. No wheezing or rales.  Chest:     Chest wall: No tenderness.  Abdominal:     General: There is no distension.     Tenderness: There is no abdominal tenderness. There is no rebound.  Musculoskeletal:        General: Normal range of motion.     Cervical back: Neck supple.  Lymphadenopathy:     Cervical: No cervical adenopathy.  Skin:    Findings: No erythema or rash.  Neurological:     Mental Status: She is alert and oriented to person, place, and time.     Motor: No abnormal muscle tone.     Coordination: Coordination normal.  Psychiatric:        Behavior: Behavior normal.     ED Results / Procedures / Treatments   Labs (all labs ordered are listed, but only abnormal results are displayed) Labs Reviewed - No data to display  EKG None  Radiology No results found.  Procedures Procedures   Medications Ordered in ED Medications - No data to display  ED Course  I have reviewed the triage vital signs and the nursing notes.  Pertinent labs & imaging results that were available during my care of the patient were reviewed by me and considered in my medical decision making (see chart for details).    MDM Rules/Calculators/A&P                          Patient with right external otitis.Marland Kitchen  She will be given Ciprodex and follow-up with ENT Final Clinical Impression(s) / ED Diagnoses Final diagnoses:  Acute malignant otitis externa, unspecified laterality    Rx / DC Orders ED Discharge Orders         Ordered    ciprofloxacin-dexamethasone (CIPRODEX) OTIC suspension  2 times daily        02/01/21 2148           Milton Ferguson, MD 02/03/21 1058

## 2021-02-01 NOTE — ED Triage Notes (Signed)
Pt with right ear pain for a week, pt is deaf and wears a hearing aid, unable to wear today due to pain.  Pt drove here and not able to get a ride.

## 2021-02-01 NOTE — Discharge Instructions (Signed)
Follow-up with Dr. Constance Holster and 1 to 2 weeks for recheck on your ear

## 2021-02-02 ENCOUNTER — Telehealth: Payer: Self-pay | Admitting: *Deleted

## 2021-02-02 NOTE — Telephone Encounter (Signed)
Transition Care Management Follow-up Telephone Call  Date of discharge and from where: 02/01/2021 - [facility]  How have you been since you were released from the hospital? "About the same"  Any questions or concerns? No  Items Reviewed:  Did the pt receive and understand the discharge instructions provided? Yes   Medications obtained and verified? Yes   Other? No   Any new allergies since your discharge? No   Dietary orders reviewed? No  Do you have support at home? No    Functional Questionnaire: (I = Independent and D = Dependent) ADLs: I  Bathing/Dressing- I  Meal Prep- I  Eating- I  Maintaining continence- I  Transferring/Ambulation- I  Managing Meds- I  Follow up appointments reviewed:   PCP Hospital f/u appt confirmed? No  - has routine appointment in August  Specialist Hospital f/u appt confirmed? No    Are transportation arrangements needed? No   If their condition worsens, is the pt aware to call PCP or go to the Emergency Dept.? Yes  Was the patient provided with contact information for the PCP's office or ED? Yes  Was to pt encouraged to call back with questions or concerns? Yes

## 2021-02-05 DIAGNOSIS — H6091 Unspecified otitis externa, right ear: Secondary | ICD-10-CM | POA: Diagnosis not present

## 2021-02-05 DIAGNOSIS — H6121 Impacted cerumen, right ear: Secondary | ICD-10-CM | POA: Diagnosis not present

## 2021-02-12 ENCOUNTER — Telehealth (INDEPENDENT_AMBULATORY_CARE_PROVIDER_SITE_OTHER): Payer: Medicaid Other | Admitting: Psychiatry

## 2021-02-12 ENCOUNTER — Encounter (HOSPITAL_COMMUNITY): Payer: Self-pay | Admitting: Psychiatry

## 2021-02-12 ENCOUNTER — Other Ambulatory Visit: Payer: Self-pay

## 2021-02-12 DIAGNOSIS — F900 Attention-deficit hyperactivity disorder, predominantly inattentive type: Secondary | ICD-10-CM

## 2021-02-12 DIAGNOSIS — F331 Major depressive disorder, recurrent, moderate: Secondary | ICD-10-CM

## 2021-02-12 MED ORDER — METHYLPHENIDATE HCL ER (OSM) 36 MG PO TBCR: 36.0000 mg | EXTENDED_RELEASE_TABLET | Freq: Every day | ORAL | 0 refills | Status: DC

## 2021-02-12 MED ORDER — FLUOXETINE HCL 40 MG PO CAPS: 40.0000 mg | ORAL_CAPSULE | Freq: Every day | ORAL | 2 refills | Status: DC

## 2021-02-12 NOTE — Progress Notes (Signed)
Virtual Visit via Telephone Note  I connected with Lacey Hartman on 02/12/21 at 10:00 AM EDT by telephone and verified that I am speaking with the correct person using two identifiers.  Location: Patient: home Provider: home office   I discussed the limitations, risks, security and privacy concerns of performing an evaluation and management service by telephone and the availability of in person appointments. I also discussed with the patient that there may be a patient responsible charge related to this service. The patient expressed understanding and agreed to proceed.   I discussed the assessment and treatment plan with the patient. The patient was provided an opportunity to ask questions and all were answered. The patient agreed with the plan and demonstrated an understanding of the instructions.   The patient was advised to call back or seek an in-person evaluation if the symptoms worsen or if the condition fails to improve as anticipated.  I provided 15 minutes of non-face-to-face time during this encounter.   Levonne Spiller, MD  [place] MD/PA/NP OP Progress Note  02/12/2021 10:31 AM Lacey Hartman  MRN:  [id number]  Chief Complaint:  Chief Complaint    Depression; ADD; Follow-up     HPI: This patient is a 25 year old single white female lives with her boyfriend 2 daughters and one son in [place]. 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  The patient was referred by her primary physician, Dr. Sallee Lange, for further assessment and treatment of ADD and depression.  The patient states that she has always struggled in school. She doesn't know much about her history but does know that she was born full-term but spent most of her first year of life in the NICU at [facility]. She is not sure why. She was diagnosed with hearing loss and required speech therapy although way through school. She had trouble learning to read and write and despite being an exceptional  children's classes still does not read or write very well. When she was 25 years old in [place] she was very hyperactive and unfocused and distractible. At that time she was diagnosed with ADHD and was on Ritalin for a while and later Adderall. She stopped these medications when she became pregnant at age 25.  The patient states that she used to go to [facility] because of depression. The physician there put her on Prozac last year and she is still taking it although she has not been back to that clinic in quite some time. She states that she still has difficulties with bad temper crying spells anger anxiety and mood swings. At times she gets overwhelmed in caring for the children. Her husband has seizures and is on disability so he is able to be at home and help her most of the time. She also gets anxious and nervous around new people. She denies auditory or visual hallucinations or paranoia. She does not use drugs or alcohol. She is now using an Implanon implant to prevent pregnancy as she has had so many children in such a short amount of time.  The patient states she is interested in getting back on medication for ADHD. She's no longer hyperactive but very unfocused distractible and on able to finish tasks  The patient returns for follow-up after 3 months.  She states in general she continues to do well.  Her mood has been stable and she has not been depressed or anxious.  Most the time she is sleeping okay.  She worries about a 27-year-old  son who seems to have ADHD but currently not being treated.  We discussed some treatment options for him.  She is focusing well herself with the Concerta. Visit Diagnosis:    ICD-10-CM   1. Moderate episode of recurrent major depressive disorder (HCC)  F33.1   2. Attention deficit hyperactivity disorder (ADHD), predominantly inattentive type  F90.0     Past Psychiatric History: none  Past Medical History:  Past Medical History:  Diagnosis Date  . ADHD  (attention deficit hyperactivity disorder)   . Anxiety   . Asthma    inhaler prn  . Change in color of skin mole 11/22/2019  . Depression   . Ex-cigarette smoker 11/22/2019  . Hard of hearing   . Right conjunctivitis 08/10/2018  . Smoker 12/03/2013   1-2ppd prior to pregnancy, down to 2cigs/day trying to quit                       01/01/14: 4cigs/day   [place] [facility]   . 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    Past Surgical History:  Procedure Laterality Date  . NO PAST SURGERIES      Family Psychiatric History: see below  Family History:  Family History  Problem Relation Age of Onset  . Depression Mother   . Anxiety disorder Mother   . Bipolar disorder Mother   . Asthma Mother   . Cancer Maternal Grandmother        breast cancer, lung cancer X 2, colon cancer. Three primary cancers  . Cancer Maternal Uncle        throat  . ADD / ADHD Brother   . Breast cancer Brother   . ADD / ADHD Brother   . Asthma Brother   . Allergic rhinitis Brother   . Cancer Paternal Grandmother        breast  . Angioedema Neg Hx   . Atopy Neg Hx   . Eczema Neg Hx   . Immunodeficiency Neg Hx   . Urticaria Neg Hx     Social History:  Social History   Socioeconomic History  . Marital status: Significant Other    Spouse name: [name]  . Number of children: 3  . Years of education: Not on file  . Highest education level: 9th grade  Occupational History  . Not on file  Tobacco Use  . Smoking status: Former Smoker    Packs/day: 0.33    Years: 7.00    Pack years: 2.31    Types: Cigarettes  . Smokeless tobacco: Never Used  Vaping Use  . Vaping Use: Never used  Substance and Sexual Activity  . Alcohol use: No    Alcohol/week: 0.0 standard drinks  . Drug use: No  . Sexual activity: Yes    Birth control/protection: None  Other Topics Concern  . Not on file  Social History Narrative   Lives with [name] and 3 children   [name]   [name]      Enjoys: playing with kids, going  outside, beach, crafts      Diet: does not eat a lot of veggies or fruits, a lot of junk    Caffeine: 15 sodas   Water: 4-5 cups daily      Wears seat belt    Smoke detectors   Does not use phone while driving    Social Determinants of Health   Financial Resource Strain: Not on file  Food Insecurity: Not on file  Transportation  Needs: Not on file  Physical Activity: Not on file  Stress: Not on file  Social Connections: Not on file    Allergies:  Allergies  Allergen Reactions  . Codeine Hives, Swelling and Rash    & hallucinations/    Metabolic Disorder Labs: No results found for: HGBA1C, MPG No results found for: PROLACTIN Lab Results  Component Value Date   CHOL 147 07/24/2019   TRIG 73 07/24/2019   HDL 44 07/24/2019   CHOLHDL 3.3 07/24/2019   LDLCALC 89 07/24/2019   LDLCALC 85 02/16/2017   Lab Results  Component Value Date   TSH 1.780 07/29/2020   TSH 1.700 02/16/2017    Therapeutic Level Labs: No results found for: LITHIUM No results found for: VALPROATE No components found for:  CBMZ  Current Medications: Current Outpatient Medications  Medication Sig Dispense Refill  . methylphenidate (CONCERTA) 36 MG PO CR tablet Take 1 tablet (36 mg total) by mouth daily. 30 tablet 0  . methylphenidate (CONCERTA) 36 MG PO CR tablet Take 1 tablet (36 mg total) by mouth daily. 30 tablet 0  . albuterol (VENTOLIN HFA) 108 (90 Base) MCG/ACT inhaler Inhale 1-2 puffs into the lungs every 4 (four) hours as needed for wheezing or shortness of breath. 18 g 1  . ciprofloxacin-dexamethasone (CIPRODEX) OTIC suspension Place 4 drops into the right ear 2 (two) times daily. 7.5 mL 0  . etonogestrel (NEXPLANON) 68 MG IMPL implant 1 each by Subdermal route once.    Marland Kitchen FLUoxetine (PROZAC) 40 MG capsule Take 1 capsule (40 mg total) by mouth daily. 30 capsule 2  . lubiprostone (AMITIZA) 8 MCG capsule Take 1 capsule (8 mcg total) by mouth daily with breakfast. 60 capsule 3  .  methylphenidate (CONCERTA) 36 MG PO CR tablet Take 1 tablet (36 mg total) by mouth daily. 30 tablet 0  . nitrofurantoin, macrocrystal-monohydrate, (MACROBID) 100 MG capsule Take 1 capsule (100 mg total) by mouth 2 (two) times daily. 14 capsule 0  . omeprazole (PRILOSEC) 20 MG capsule Take one tablet daily in the evening. 90 capsule 1  . rizatriptan (MAXALT) 5 MG tablet Take 1 tablet (5 mg total) by mouth as needed for migraine. May repeat in 2 hours if needed 10 tablet 0  . Saccharomyces boulardii (PROBIOTIC) 250 MG CAPS Take 1 capsule by mouth at bedtime. 30 capsule 1   No current facility-administered medications for this visit.     Musculoskeletal: Strength & Muscle Tone: within normal limits Gait & Station: normal Patient leans: N/A  Psychiatric Specialty Exam: Review of Systems  All other systems reviewed and are negative.   unknown if currently breastfeeding.There is no height or weight on file to calculate BMI.  General Appearance: NA  Eye Contact:  NA  Speech:  Clear and Coherent  Volume:  Normal  Mood:  Euthymic  Affect:  Congruent  Thought Process:  Goal Directed  Orientation:  Full (Time, Place, and Person)  Thought Content: WDL   Suicidal Thoughts:  No  Homicidal Thoughts:  No  Memory:  Immediate;   Good Recent;   Good Remote;   NA  Judgement:  Good  Insight:  Fair  Psychomotor Activity:  Normal  Concentration:  Concentration: Good and Attention Span: Good  Recall:  Good  Fund of Knowledge: Fair  Language: Fair  Akathisia:  No  Handed:  Right  AIMS (if indicated): not done  Assets:  Communication Skills Desire for Improvement Physical Health Resilience Social Support Talents/Skills  ADL's:  Intact  Cognition: Impaired,  Mild  Sleep:  Good   Screenings: GAD-7   Flowsheet Row Video Visit from 03/11/2020 in [facility] Office Visit from 12/20/2019 in [facility] Office Visit from 11/22/2019 in [facility]  Total GAD-7  Score 5 18 14     PHQ2-9   Flowsheet Row Video Visit from 02/12/2021 in [facility] Office Visit from 11/20/2020 in [facility] Office Visit from 10/14/2020 in [facility] Office Visit from 07/18/2020 in [facility] Video Visit from 07/17/2020 in [facility]  PHQ-2 Total Score 0 0 0 0 0  PHQ-9 Total Score -- -- -- -- 0    Flowsheet Row Video Visit from 02/12/2021 in [facility] from 02/01/2021 in [facility] No Risk No Risk       Assessment and Plan: This patient is a 25 year old female with a history of mild cognitive impairment hearing loss, depression and ADD.  She continues to do well on her current regimen.  She will continue Prozac 40 mg daily for depression and Concerta 36 mg every morning for ADD.  She will return to see me in 3 months   Levonne Spiller, MD 02/12/2021, 10:31 AM

## 2021-04-29 ENCOUNTER — Telehealth: Payer: Medicaid Other | Admitting: Family Medicine

## 2021-04-29 ENCOUNTER — Encounter: Payer: Self-pay | Admitting: Family Medicine

## 2021-04-29 ENCOUNTER — Other Ambulatory Visit: Payer: Self-pay

## 2021-04-29 VITALS — BP 119/83 | HR 86 | Temp 98.1°F | Resp 18 | Ht 60.0 in | Wt 181.0 lb

## 2021-04-29 DIAGNOSIS — H66002 Acute suppurative otitis media without spontaneous rupture of ear drum, left ear: Secondary | ICD-10-CM | POA: Diagnosis not present

## 2021-04-29 DIAGNOSIS — R42 Dizziness and giddiness: Secondary | ICD-10-CM | POA: Diagnosis not present

## 2021-04-29 DIAGNOSIS — Z7185 Encounter for immunization safety counseling: Secondary | ICD-10-CM | POA: Diagnosis not present

## 2021-04-29 DIAGNOSIS — H6692 Otitis media, unspecified, left ear: Secondary | ICD-10-CM | POA: Insufficient documentation

## 2021-04-29 MED ORDER — SULFAMETHOXAZOLE-TRIMETHOPRIM 800-160 MG PO TABS: 1.0000 | ORAL_TABLET | Freq: Two times a day (BID) | ORAL | 0 refills | Status: DC

## 2021-04-29 MED ORDER — MECLIZINE HCL 25 MG PO TABS: 25.0000 mg | ORAL_TABLET | Freq: Two times a day (BID) | ORAL | 0 refills | Status: DC | PRN

## 2021-04-29 NOTE — Assessment & Plan Note (Signed)
Septra x 1 week and f/u in 2 weeks

## 2021-04-29 NOTE — Assessment & Plan Note (Signed)
Antivert prescribed short term for as needed, use

## 2021-04-29 NOTE — Progress Notes (Signed)
   Lacey Hartman     MRN: [id number]      DOB: 1996-08-06   HPI Lacey Hartman is here with a 1 week h/o left ear pain, drainage from ear x 2 days, yellow,. And dizziness. No fever  or  chills C/o vertigo x 3 days  ROS Denies recent fever or chills. Denies sinus pressure, nasal congestion, ear pain or sore throat. Denies chest congestion, productive cough or wheezing. Denies chest pains, palpitations and leg swelling Denies abdominal pain, nausea, vomiting,diarrhea or constipation.   Denies dysuria, frequency, hesitancy or incontinence. Denies joint pain, swelling and limitation in mobility. Denies headaches, seizures, numbness, or tingling. Denies depression, anxiety or insomnia. Denies skin break down or rash.   PE  BP 119/83 (BP Location: Right Arm, Patient Position: Sitting, Cuff Size: Large)   Pulse 86   Temp 98.1 F (36.7 C)   Resp 18   Ht 5' (1.524 m)   Wt 181 lb (82.1 kg)   SpO2 97%   BMI 35.35 kg/m   Patient alert and oriented and in no cardiopulmonary distress.  HEENT: No facial asymmetry, EOMI,     Neck supple .  Chest: Clear to auscultation bilaterally.  CVS: S1, S2 no murmurs, no S3.Regular rate.  ABD: Soft non tender.   Ext: No edema  MS: Adequate ROM spine, shoulders, hips and knees.  Skin: Intact, no ulcerations or rash noted.  Psych: Good eye contact, normal affect. Memory intact not anxious or depressed appearing.  CNS: CN 2-12 intact, power,  normal throughout.no focal deficits noted.   Assessment & Plan  Otitis media Septra x 1 week and f/u in 2 weeks  Vertigo Antivert prescribed short term for as needed, use  Vaccine counseling Counseled re need for HPV and covid vaccines, she will consider both and discuss furhter with PCP at next visit

## 2021-04-29 NOTE — Progress Notes (Signed)
    Lacey Hartman     MRN: [id number]      DOB: [birth year]   HPI Ms. Lacey Hartman is here fwith a 1 week h/o left ear pain with drainage, no fever , chills,  sinus pressure, sore throat  C/o vertigo x 3 days, had mild nausea this has resolved ROS Denies sinus pressure, nasal congestion, ear pain or sore throat. Denies chest congestion, productive cough or wheezing. Denies chest pains, palpitations and leg swelling Denies abdominal pain, nausea, vomiting,diarrhea or constipation.   Denies dysuria, frequency, hesitancy or incontinence. Denies joint pain, swelling and limitation in mobility. Denies headaches, seizures, numbness, or tingling. Denies depression, anxiety or insomnia. Denies skin break down or rash.   PE  BP 119/83 (BP Location: Right Arm, Patient Position: Sitting, Cuff Size: Large)   Pulse 86   Temp 98.1 F (36.7 C)   Resp 18   Ht 5' (1.524 m)   Wt 181 lb (82.1 kg)   SpO2 97%   BMI 35.35 kg/m   Patient alert and oriented and in no cardiopulmonary distress.  HEENT: No facial asymmetry, EOMI,     Neck supple .Left anterior cervical adenitis. No sinus tenderness. Left tM erythematous with reduced light reflex No nystagmus  Chest: Clear to auscultation bilaterally.  CVS: S1, S2 no murmurs, no S3.Regular rate.  ABD: Soft non tender.   Ext: No edema  MS: Adequate ROM spine, shoulders, hips and knees.  Skin: Intact, no ulcerations or rash noted.  Psych: Good eye contact, normal affect.right hearing  loss wears hearing aid,  not anxious or depressed appearing.  CNS: CN 2-12 intact, power,  normal throughout.no focal deficits noted.   Assessment & Plan  Otitis media Septra x 1 week and f/u in 2 weeks  Vertigo Antivert prescribed short term for as needed, use  Vaccine counseling Counseled re need for HPV and covid vaccines, she will consider both and discuss furhter with PCP at next visit

## 2021-04-29 NOTE — Assessment & Plan Note (Signed)
Counseled re need for HPV and covid vaccines, she will consider both and discuss furhter with PCP at next visit

## 2021-04-29 NOTE — Patient Instructions (Signed)
Please keep August follow-up appointment with your primary care provider as already scheduled.  Call if needed sooner.  You are treated today with Septra for acute left ear infection and also with Antivert for dizziness.  Please consider the vaccines that you do need that we spoke about which would be the COVID-vaccine as well as HPV vaccines  Thanks for choosing [facility], we consider it a privelige to serve you.

## 2021-05-06 ENCOUNTER — Encounter (HOSPITAL_COMMUNITY): Payer: Self-pay | Admitting: *Deleted

## 2021-05-06 ENCOUNTER — Other Ambulatory Visit: Payer: Self-pay

## 2021-05-06 ENCOUNTER — Telehealth: Payer: Self-pay

## 2021-05-06 ENCOUNTER — Emergency Department (HOSPITAL_COMMUNITY)
Admission: EM | Admit: 2021-05-06 | Discharge: 2021-05-06 | Disposition: A | Discharge status: Home / Self Care | Payer: Medicaid Other | Attending: Emergency Medicine | Admitting: Emergency Medicine

## 2021-05-06 DIAGNOSIS — H60502 Unspecified acute noninfective otitis externa, left ear: Secondary | ICD-10-CM | POA: Diagnosis not present

## 2021-05-06 DIAGNOSIS — J45909 Unspecified asthma, uncomplicated: Secondary | ICD-10-CM | POA: Insufficient documentation

## 2021-05-06 DIAGNOSIS — H6092 Unspecified otitis externa, left ear: Secondary | ICD-10-CM | POA: Diagnosis not present

## 2021-05-06 DIAGNOSIS — Z87891 Personal history of nicotine dependence: Secondary | ICD-10-CM | POA: Insufficient documentation

## 2021-05-06 DIAGNOSIS — H9202 Otalgia, left ear: Secondary | ICD-10-CM | POA: Diagnosis present

## 2021-05-06 MED ORDER — AMOXICILLIN-POT CLAVULANATE 875-125 MG PO TABS: 1.0000 | ORAL_TABLET | Freq: Two times a day (BID) | ORAL | 0 refills | Status: AC

## 2021-05-06 MED ORDER — NEOMYCIN-POLYMYXIN-HC 3.5-10000-1 OT SOLN: 3.0000 [drp] | Freq: Four times a day (QID) | OTIC | 0 refills | Status: AC

## 2021-05-06 NOTE — ED Notes (Signed)
Pt in bed, pt continues to c/o ear pain, pt states that she is ready to go home pt verbalized understanding d/c and follow up, pt ambulatory from dpt.

## 2021-05-06 NOTE — ED Provider Notes (Signed)
[facility] Provider Note   CSN: [id number] Arrival date & time: 05/06/21  1733     History Chief Complaint  Patient presents with   Otalgia    Left ear     Lacey Hartman is a 25 y.o. female.  The history is provided by the patient. No language interpreter was used.  Otalgia Location:  Right Quality:  Aching Severity:  Moderate Duration:  2 weeks Timing:  Constant Progression:  Worsening Chronicity:  New Relieved by:  Nothing Worsened by:  Nothing Associated symptoms: no congestion, no cough and no ear discharge   Pt has been on bactrim for a week.      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    Patient Active Problem List   Diagnosis Date Noted   Left otitis media 04/29/2021   Vertigo 04/29/2021   Vaccine counseling 04/29/2021   Dysuria 11/20/2020   Vaginal odor 11/20/2020   Otitis media 10/14/2020   IBS (irritable bowel syndrome) 07/23/2020   Impacted cerumen of right ear 07/18/2020   Bloated abdomen 06/11/2020   Axillary tenderness, left 12/20/2019   Breast tenderness in female 12/20/2019   Lump in lower inner quadrant of right breast 12/20/2019   Family history of breast cancer 12/20/2019   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   GERD (gastroesophageal reflux disease) 11/22/2019   Cluster headache, not intractable 11/22/2019   Psychophysiological insomnia 11/22/2019   Anxiety 11/22/2019   Mild asthma 11/22/2019   Obesity (BMI 30-39.9) 11/22/2019   Depression 06/10/2016   ADD (attention deficit disorder) 12/24/2015    Past Surgical  History:  Procedure Laterality Date   NO PAST SURGERIES       OB History     Gravida  3   Para  2   Term  1   Preterm  1   AB  1   Living  2      SAB  1   IAB  0   Ectopic  0   Multiple  0   Live Births  2           Family History  Problem Relation Age of Onset   Depression Mother    Anxiety disorder Mother    Bipolar disorder Mother    Asthma Mother    Cancer Maternal Grandmother        breast cancer, lung cancer X 2, colon cancer. Three primary cancers   Cancer Maternal Uncle        throat   ADD / ADHD Brother    Breast cancer Brother    ADD / ADHD Brother    Asthma Brother    Allergic rhinitis Brother    Cancer Paternal Grandmother        breast   Angioedema Neg Hx    Atopy Neg Hx    Eczema Neg Hx    Immunodeficiency Neg Hx    Urticaria Neg  Hx     Social History   Tobacco Use   Smoking status: Former    Packs/day: 0.33    Years: 7.00    Pack years: 2.31    Types: Cigarettes   Smokeless tobacco: Never  Vaping Use   Vaping Use: Never used  Substance Use Topics   Alcohol use: No    Alcohol/week: 0.0 standard drinks   Drug use: No    Home Medications Prior to Admission medications   Medication Sig Start Date End Date Taking? Authorizing Provider  amoxicillin-clavulanate (AUGMENTIN) 875-125 MG tablet Take 1 tablet by mouth 2 (two) times daily for 10 days. 05/06/21 05/16/21 Yes Fransico Meadow, PA-C  neomycin-polymyxin-hydrocortisone (CORTISPORIN) OTIC solution Place 3 drops into the left ear 4 (four) times daily for 10 days. 05/06/21 05/16/21 Yes Fransico Meadow, PA-C  albuterol (VENTOLIN HFA) 108 (90 Base) MCG/ACT inhaler Inhale 1-2 puffs into the lungs every 4 (four) hours as needed for wheezing or shortness of breath. 11/20/20   Noreene Larsson, NP  ciprofloxacin-dexamethasone (CIPRODEX) OTIC suspension Place 4 drops into the right ear 2 (two) times daily. 02/01/21   Milton Ferguson, MD  etonogestrel (NEXPLANON) 68 MG IMPL implant 1 each by  Subdermal route once.    [provider]  FLUoxetine (PROZAC) 40 MG capsule Take 1 capsule (40 mg total) by mouth daily. 02/12/21   Cloria Spring, MD  lubiprostone [facility]) 8 MCG capsule Take 1 capsule (8 mcg total) by mouth daily with breakfast. 10/29/20   Annitta Needs, NP  meclizine (ANTIVERT) 25 MG tablet Take 1 tablet (25 mg total) by mouth 2 (two) times daily as needed for dizziness. 04/29/21   Fayrene Helper, MD  methylphenidate (CONCERTA) 36 MG PO CR tablet Take 1 tablet (36 mg total) by mouth daily. 02/12/21 02/12/22  Cloria Spring, MD  methylphenidate (CONCERTA) 36 MG PO CR tablet Take 1 tablet (36 mg total) by mouth daily. 02/12/21 02/12/22  Cloria Spring, MD  methylphenidate (CONCERTA) 36 MG PO CR tablet Take 1 tablet (36 mg total) by mouth daily. 02/12/21 02/12/22  Cloria Spring, MD  omeprazole (PRILOSEC) 20 MG capsule Take one tablet daily in the evening. 11/20/20   Noreene Larsson, NP  rizatriptan (MAXALT) 5 MG tablet Take 1 tablet (5 mg total) by mouth as needed for migraine. May repeat in 2 hours if needed 11/22/19   Perlie Mayo, NP  Saccharomyces boulardii (PROBIOTIC) 250 MG CAPS Take 1 capsule by mouth at bedtime. 11/22/19   Perlie Mayo, NP  sulfamethoxazole-trimethoprim (BACTRIM DS) 800-160 MG tablet Take 1 tablet by mouth 2 (two) times daily. 04/29/21   Fayrene Helper, MD    Allergies    Codeine  Review of Systems   Review of Systems  HENT:  Positive for ear pain. Negative for congestion and ear discharge.   Respiratory:  Negative for cough.   All other systems reviewed and are negative.  Physical Exam Updated Vital Signs BP 125/83 (BP Location: Right Arm)   Pulse 92   Temp 98.2 F (36.8 C) (Oral)   Resp 18   Ht 5' (1.524 m)   Wt 82.6 kg   SpO2 98%   BMI 35.54 kg/m   Physical Exam Vitals reviewed.  Constitutional:      Appearance: Normal appearance.  HENT:     Right Ear: Tympanic membrane normal.     Ears:     Comments: Swollen left  ear canal,  Nose: Nose normal.     Mouth/Throat:     Mouth: Mucous membranes are moist.  Cardiovascular:     Rate and Rhythm: Normal rate and regular rhythm.  Pulmonary:     Effort: Pulmonary effort is normal.  Musculoskeletal:        General: Normal range of motion.  Skin:    General: Skin is warm.  Neurological:     General: No focal deficit present.     Mental Status: She is alert.  Psychiatric:        Mood and Affect: Mood normal.    ED Results / Procedures / Treatments   Labs (all labs ordered are listed, but only abnormal results are displayed) Labs Reviewed - No data to display  EKG None  Radiology No results found.  Procedures Procedures   Medications Ordered in ED Medications - No data to display  ED Course  I have reviewed the triage vital signs and the nursing notes.  Pertinent labs & imaging results that were available during my care of the patient were reviewed by me and considered in my medical decision making (see chart for details).    MDM Rules/Calculators/A&P                           MDM:  Pt advised to use drops and start augmentin Final Clinical Impression(s) / ED Diagnoses Final diagnoses:  Acute otitis externa of left ear, unspecified type    Rx / DC Orders ED Discharge Orders          Ordered    neomycin-polymyxin-hydrocortisone (CORTISPORIN) OTIC solution  4 times daily        05/06/21 1906    amoxicillin-clavulanate (AUGMENTIN) 875-125 MG tablet  2 times daily        05/06/21 1906          An After Visit Summary was printed and given to the patient.    Sidney Ace 05/06/21 Mathews Argyle, MD 05/06/21 2006

## 2021-05-06 NOTE — ED Triage Notes (Signed)
Pt c/o continued left ear pain after being on an antibiotic and finishing it

## 2021-05-06 NOTE — Telephone Encounter (Signed)
Patient called said ear is no better and the medicine is not helping. Call back # [phone]

## 2021-05-07 ENCOUNTER — Telehealth: Payer: Self-pay

## 2021-05-07 NOTE — Telephone Encounter (Signed)
Transition Care Management Follow-up Telephone Call Date of discharge and from where: 05/06/2021-[facility] ED How have you been since you were released from the hospital? Patients stated she is doing fine. Any questions or concerns? No  Items Reviewed: Did the pt receive and understand the discharge instructions provided? Yes  Medications obtained and verified? Yes  Other? No  Any new allergies since your discharge? No  Dietary orders reviewed? N/A Do you have support at home? Yes   Home Care and Equipment/Supplies: Were home health services ordered? not applicable If so, what is the name of the agency? N/A  Has the agency set up a time to come to the patient's home? not applicable Were any new equipment or medical supplies ordered?  No What is the name of the medical supply agency? N/A Were you able to get the supplies/equipment? not applicable Do you have any questions related to the use of the equipment or supplies? No  Functional Questionnaire: (I = Independent and D = Dependent) ADLs: I  Bathing/Dressing- I  Meal Prep- I  Eating- I  Maintaining continence- I  Transferring/Ambulation- I  Managing Meds- I  Follow up appointments reviewed:  PCP Hospital f/u appt confirmed? Yes  Scheduled to see Demetrius Revel, NP on 05/20/2021 @ 10:20 am. [facility] f/u appt confirmed? No   Are transportation arrangements needed? No  If their condition worsens, is the pt aware to call PCP or go to the Emergency Dept.? Yes Was the patient provided with contact information for the PCP's office or ED? Yes Was to pt encouraged to call back with questions or concerns? Yes

## 2021-05-07 NOTE — Telephone Encounter (Signed)
Pt states she already see's Dr. Benjamine Mola. States she has an appt with [facility] in August.

## 2021-05-20 ENCOUNTER — Ambulatory Visit: Payer: Medicaid Other | Admitting: Family Medicine

## 2021-05-20 ENCOUNTER — Encounter: Payer: Self-pay | Admitting: Nurse Practitioner

## 2021-05-20 ENCOUNTER — Other Ambulatory Visit: Payer: Self-pay

## 2021-05-20 ENCOUNTER — Ambulatory Visit: Payer: Medicaid Other | Admitting: Nurse Practitioner

## 2021-05-20 VITALS — BP 118/80 | HR 82 | Ht 60.0 in | Wt 181.0 lb

## 2021-05-20 DIAGNOSIS — H60312 Diffuse otitis externa, left ear: Secondary | ICD-10-CM | POA: Diagnosis not present

## 2021-05-20 DIAGNOSIS — H6090 Unspecified otitis externa, unspecified ear: Secondary | ICD-10-CM | POA: Insufficient documentation

## 2021-05-20 DIAGNOSIS — H6690 Otitis media, unspecified, unspecified ear: Secondary | ICD-10-CM | POA: Insufficient documentation

## 2021-05-20 MED ORDER — NEOMYCIN-POLYMYXIN-HC 3.5-10000-1 OT SOLN: 4.0000 [drp] | Freq: Four times a day (QID) | OTIC | 0 refills | Status: DC

## 2021-05-20 MED ORDER — DOXYCYCLINE HYCLATE 100 MG PO TABS: 100.0000 mg | ORAL_TABLET | Freq: Two times a day (BID) | ORAL | 0 refills | Status: DC

## 2021-05-20 NOTE — Assessment & Plan Note (Addendum)
-  Refilled cortisporin drops for her left ear because she had relief with those -she has erythema along the auditory canal, pain to touch to left outer ear, and tenderness to soft tissue near her ear (No obvious swelling or sinus tenderness) -Rx. Doxycycline x 10 days; confirmed with pt no pregnancy or lactation risk -if no improvement, she will f/u with ENT -check CBC and CMP

## 2021-05-20 NOTE — Patient Instructions (Signed)
If no improvement in 10-14 days (or if symptoms get worse), follow-up with your ear doctor.

## 2021-05-20 NOTE — Progress Notes (Signed)
Acute Office Visit  Subjective:    Patient ID: Lacey Hartman, female    DOB: [birth year], 25 y.o.   MRN: [id number]  Chief Complaint  Patient presents with   Follow-up    HPI Patient is in today for left ear pain. She rates her pain at 5/10. She states it was severe when she went to the emergency department. She has been taking ibuprofen for her pain.  She has tried ciprodex ear drops, PO bactrim, and corticosporin ear drops.  She states that the pain improved with corticosporin drops, but pain has been gradually getting worse since she stopped using the drops.  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 Three primary cancers   Cancer Maternal Uncle        throat   ADD / ADHD Brother    Breast cancer Brother    ADD / ADHD Brother    Asthma Brother    Allergic rhinitis Brother    Cancer Paternal Grandmother        breast   Angioedema Neg Hx    Atopy Neg Hx    Eczema Neg Hx    Immunodeficiency Neg Hx    Urticaria Neg Hx     Social History   Socioeconomic History   Marital status: Significant Other    Spouse name: [name]   Number of children: 3   Years of education: Not on file   Highest education level: 9th grade  Occupational History   Not on file  Tobacco Use    Smoking status: Former    Packs/day: 0.33    Years: 7.00    Pack years: 2.31    Types: Cigarettes   Smokeless tobacco: Never  Vaping Use   Vaping Use: Never used  Substance and Sexual Activity   Alcohol use: No    Alcohol/week: 0.0 standard drinks   Drug use: No   Sexual activity: Yes    Birth control/protection: None  Other Topics Concern   Not on file  Social History Narrative   Lives with [name] and 3 children   [name]   [name]      Enjoys: playing with kids, going outside, beach, crafts      Diet: does not eat a lot of veggies or fruits, a lot of junk    Caffeine: 15 sodas   Water: 4-5 cups daily      Wears seat belt    Smoke detectors   Does not use phone while driving  Social Determinants of Health   Financial Resource Strain: Not on file  Food Insecurity: Not on file  Transportation Needs: Not on file  Physical Activity: Not on file  Stress: Not on file  Social Connections: Not on file  Intimate Partner Violence: Not on file    Outpatient Medications Prior to Visit  Medication Sig Dispense Refill   albuterol (VENTOLIN HFA) 108 (90 Base) MCG/ACT inhaler Inhale 1-2 puffs into the lungs every 4 (four) hours as needed for wheezing or shortness of breath. 18 g 1   FLUoxetine (PROZAC) 40 MG capsule Take 1 capsule (40 mg total) by mouth daily. 30 capsule 2   lubiprostone (AMITIZA) 8 MCG capsule Take 1 capsule (8 mcg total) by mouth daily with breakfast. 60 capsule 3   meclizine (ANTIVERT) 25 MG tablet Take 1 tablet (25 mg total) by mouth 2 (two) times daily as needed for dizziness. 14 tablet 0   methylphenidate (CONCERTA) 36 MG PO CR tablet Take 1 tablet (36 mg total) by mouth daily. 30 tablet 0   omeprazole (PRILOSEC) 20 MG capsule Take one tablet daily in the evening. 90 capsule 1   rizatriptan (MAXALT) 5 MG tablet Take 1 tablet (5 mg total) by mouth as needed for migraine. May repeat in 2 hours if needed 10 tablet 0   Saccharomyces boulardii  (PROBIOTIC) 250 MG CAPS Take 1 capsule by mouth at bedtime. 30 capsule 1   ciprofloxacin-dexamethasone (CIPRODEX) OTIC suspension Place 4 drops into the right ear 2 (two) times daily. 7.5 mL 0   methylphenidate (CONCERTA) 36 MG PO CR tablet Take 1 tablet (36 mg total) by mouth daily. 30 tablet 0   methylphenidate (CONCERTA) 36 MG PO CR tablet Take 1 tablet (36 mg total) by mouth daily. 30 tablet 0   etonogestrel (NEXPLANON) 68 MG IMPL implant 1 each by Subdermal route once.     sulfamethoxazole-trimethoprim (BACTRIM DS) 800-160 MG tablet Take 1 tablet by mouth 2 (two) times daily. (Patient not taking: Reported on 05/20/2021) 14 tablet 0   No facility-administered medications prior to visit.    Allergies  Allergen Reactions   Codeine Hives, Swelling and Rash    & hallucinations/    Review of Systems  Constitutional: Negative.   HENT:  Positive for ear pain.        Left ear pain  Respiratory: Negative.    Cardiovascular: Negative.       Objective:    Physical Exam Constitutional:      Appearance: Normal appearance. She is obese.  HENT:     Ears:     Comments: Erythema to left auditory canal; she has pain to left ear and left face with palpation, no palpable lymph nodes Cardiovascular:     Rate and Rhythm: Normal rate and regular rhythm.     Pulses: Normal pulses.     Heart sounds: Normal heart sounds.  Pulmonary:     Effort: Pulmonary effort is normal.     Breath sounds: Normal breath sounds.  Neurological:     Mental Status: She is alert.    BP 118/80 (BP Location: Right Arm, Patient Position: Sitting, Cuff Size: Normal)   Pulse 82   Ht 5' (1.524 m)   Wt 181 lb (82.1 kg)   LMP 05/18/2021   SpO2 98%   Breastfeeding No   BMI 35.35 kg/m  Wt Readings from Last 3 Encounters:  05/20/21 181 lb (82.1 kg)  05/06/21 182 lb (82.6 kg)  04/29/21 181 lb (82.1 kg)  Health Maintenance Due  Topic Date Due   HPV VACCINES (1 - 2-dose series) Never done   Hepatitis C Screening   Never done   INFLUENZA VACCINE  05/18/2021       Topic Date Due   HPV VACCINES (1 - 2-dose series) Never done     Lab Results  Component Value Date   TSH 1.780 07/29/2020   Lab Results  Component Value Date   WBC 8.5 07/29/2020   HGB 13.0 07/29/2020   HCT 40.0 07/29/2020   MCV 93 07/29/2020   PLT 221 07/29/2020   Lab Results  Component Value Date   NA 141 07/29/2020   K 4.4 07/29/2020   CO2 21 07/29/2020   GLUCOSE 85 07/29/2020   BUN 9 07/29/2020   CREATININE 0.80 07/29/2020   BILITOT 0.3 07/29/2020   ALKPHOS 89 07/29/2020   AST 20 07/29/2020   ALT 17 07/29/2020   PROT 6.7 07/29/2020   ALBUMIN 4.4 07/29/2020   CALCIUM 9.5 07/29/2020   Lab Results  Component Value Date   CHOL 147 07/24/2019   Lab Results  Component Value Date   HDL 44 07/24/2019   Lab Results  Component Value Date   LDLCALC 89 07/24/2019   Lab Results  Component Value Date   TRIG 73 07/24/2019   Lab Results  Component Value Date   CHOLHDL 3.3 07/24/2019   No results found for: HGBA1C     Assessment & Plan:   Problem List Items Addressed This Visit       Nervous and Auditory   Otitis externa - Primary    -Refilled cortisporin drops for her left ear because she had relief with those -she has erythema along the auditory canal, pain to touch to left outer ear, and tenderness to soft tissue near her ear (No obvious swelling or sinus tenderness) -Rx. Doxycycline x 10 days; confirmed with pt no pregnancy or lactation risk -if no improvement, she will f/u with ENT -check CBC and CMP       Relevant Medications   doxycycline (VIBRA-TABS) 100 MG tablet   neomycin-polymyxin-hydrocortisone (CORTISPORIN) OTIC solution   Other Relevant Orders   CBC with Differential/Platelet   CMP14+EGFR     Meds ordered this encounter  Medications   doxycycline (VIBRA-TABS) 100 MG tablet    Sig: Take 1 tablet (100 mg total) by mouth 2 (two) times daily.    Dispense:  20 tablet    Refill:  0    neomycin-polymyxin-hydrocortisone (CORTISPORIN) OTIC solution    Sig: Place 4 drops into the left ear 4 (four) times daily.    Dispense:  10 mL    Refill:  0     Noreene Larsson, NP

## 2021-08-06 ENCOUNTER — Telehealth (HOSPITAL_COMMUNITY): Payer: Self-pay | Admitting: Psychiatry

## 2021-08-06 NOTE — Telephone Encounter (Signed)
Hi [name], patient called in requesting refills on meds

## 2021-08-07 ENCOUNTER — Other Ambulatory Visit (HOSPITAL_COMMUNITY): Payer: Self-pay | Admitting: Psychiatry

## 2021-08-07 ENCOUNTER — Telehealth (HOSPITAL_COMMUNITY): Payer: Self-pay

## 2021-08-07 ENCOUNTER — Telehealth (HOSPITAL_COMMUNITY): Payer: Self-pay | Admitting: Psychiatry

## 2021-08-07 MED ORDER — FLUOXETINE HCL 40 MG PO CAPS: 40.0000 mg | ORAL_CAPSULE | Freq: Every day | ORAL | 2 refills | Status: DC

## 2021-08-07 MED ORDER — METHYLPHENIDATE HCL ER (OSM) 36 MG PO TBCR: 36.0000 mg | EXTENDED_RELEASE_TABLET | Freq: Every day | ORAL | 0 refills | Status: DC

## 2021-08-07 NOTE — Telephone Encounter (Signed)
Called patient to schedule f/u appt both numbers in the chart does not have a voicemail box set up could not leave message.

## 2021-08-07 NOTE — Telephone Encounter (Signed)
Sent, please call  her to schedule f/u

## 2021-08-07 NOTE — Telephone Encounter (Signed)
Patient called requesting a refill on her Fluoxetine 40mg  and her Concerta 36mg  to be sent to [facility] on [address] in [place]. Her last appointment was on 4/28. No future appts have been made. Please review and advise. Thank you

## 2021-08-12 DIAGNOSIS — Z3200 Encounter for pregnancy test, result unknown: Secondary | ICD-10-CM | POA: Diagnosis not present

## 2021-10-02 ENCOUNTER — Emergency Department (HOSPITAL_COMMUNITY)
Admission: EM | Admit: 2021-10-02 | Discharge: 2021-10-03 | Disposition: A | Discharge status: Home / Self Care | Payer: Medicaid Other | Attending: Emergency Medicine | Admitting: Emergency Medicine

## 2021-10-02 ENCOUNTER — Encounter (HOSPITAL_COMMUNITY): Payer: Self-pay | Admitting: Emergency Medicine

## 2021-10-02 ENCOUNTER — Other Ambulatory Visit: Payer: Self-pay

## 2021-10-02 DIAGNOSIS — H6692 Otitis media, unspecified, left ear: Secondary | ICD-10-CM | POA: Insufficient documentation

## 2021-10-02 DIAGNOSIS — J45909 Unspecified asthma, uncomplicated: Secondary | ICD-10-CM | POA: Insufficient documentation

## 2021-10-02 DIAGNOSIS — Z87891 Personal history of nicotine dependence: Secondary | ICD-10-CM | POA: Insufficient documentation

## 2021-10-02 DIAGNOSIS — H66002 Acute suppurative otitis media without spontaneous rupture of ear drum, left ear: Secondary | ICD-10-CM

## 2021-10-02 DIAGNOSIS — H9202 Otalgia, left ear: Secondary | ICD-10-CM | POA: Diagnosis present

## 2021-10-02 NOTE — ED Triage Notes (Signed)
Patient to the ED with complaints of left ear pain x 2 days.  Denies fevers. NAD noted

## 2021-10-03 MED ORDER — AMOXICILLIN 500 MG PO CAPS: 500.0000 mg | ORAL_CAPSULE | Freq: Three times a day (TID) | ORAL | 0 refills | Status: DC

## 2021-10-03 MED ORDER — AMOXICILLIN 250 MG PO CAPS
500.0000 mg | ORAL_CAPSULE | Freq: Once | ORAL | Status: AC
  Administered 2021-10-03: 500 mg via ORAL
  Filled 2021-10-03: qty 2

## 2021-10-03 NOTE — ED Provider Notes (Signed)
[place] Provider Note   CSN: [id number] Arrival date & time: 10/02/21  2041     History Chief Complaint  Patient presents with   Otalgia    Lacey Hartman is a 25 y.o. female.  Patient is a 25 year old female presenting with complaints of left ear pain.  She has history of deafness, ADHD, and asthma.  She denies any ear drainage.  She denies any injury or trauma.  She denies any fevers or chills.  The history is provided by the patient.  Otalgia Location:  Left Behind ear:  No abnormality Quality:  Aching Severity:  Moderate Onset quality:  Gradual Duration:  2 days Timing:  Constant Progression:  Worsening Chronicity:  New Relieved by:  Nothing Worsened by:  Nothing     Past Medical History:  Diagnosis Date   ADHD (attention deficit hyperactivity disorder)    Anxiety    Asthma    inhaler prn   Change in color of skin mole 11/22/2019   Depression    Ex-cigarette smoker 11/22/2019   Hard of hearing    Right conjunctivitis 08/10/2018   Smoker 12/03/2013   1-2ppd prior to pregnancy, down to 2cigs/day trying to quit                       01/01/14: 4cigs/day   Referred to [place]    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    Patient Active Problem List   Diagnosis Date Noted   Otitis externa 05/20/2021   Vertigo 04/29/2021   Vaccine counseling 04/29/2021   Dysuria 11/20/2020   Vaginal odor 11/20/2020   IBS (irritable bowel syndrome) 07/23/2020   Impacted cerumen of right ear 07/18/2020   Bloated abdomen 06/11/2020   Axillary tenderness, left 12/20/2019   Breast tenderness in female 12/20/2019   Lump in lower inner quadrant of right breast 12/20/2019   Family history of breast cancer 12/20/2019   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   GERD (gastroesophageal reflux disease) 11/22/2019   Cluster headache, not intractable 11/22/2019   Psychophysiological insomnia 11/22/2019   Anxiety 11/22/2019   Mild asthma 11/22/2019   Obesity (BMI 30-39.9)  11/22/2019   Depression 06/10/2016   ADD (attention deficit disorder) 12/24/2015    Past Surgical History:  Procedure Laterality Date   NO PAST SURGERIES       OB History     Gravida  3   Para  2   Term  1   Preterm  1   AB  1   Living  2      SAB  1   IAB  0   Ectopic  0   Multiple  0   Live Births  2           Family History  Problem Relation Age of Onset   Depression Mother    Anxiety disorder Mother    Bipolar disorder Mother    Asthma Mother    Cancer Maternal Grandmother        breast cancer, lung cancer X 2, colon cancer. Three primary cancers   Cancer Maternal Uncle        throat   ADD / ADHD Brother    Breast cancer Brother    ADD / ADHD Brother    Asthma Brother    Allergic rhinitis Brother    Cancer Paternal Grandmother        breast   Angioedema Neg Hx    Atopy Neg Hx    Eczema  Neg Hx    Immunodeficiency Neg Hx    Urticaria Neg Hx     Social History   Tobacco Use   Smoking status: Former    Packs/day: 0.33    Years: 7.00    Pack years: 2.31    Types: Cigarettes    Passive exposure: Current   Smokeless tobacco: Never  Vaping Use   Vaping Use: Never used  Substance Use Topics   Alcohol use: No    Alcohol/week: 0.0 standard drinks   Drug use: No    Home Medications Prior to Admission medications   Medication Sig Start Date End Date Taking? Authorizing Provider  albuterol (VENTOLIN HFA) 108 (90 Base) MCG/ACT inhaler Inhale 1-2 puffs into the lungs every 4 (four) hours as needed for wheezing or shortness of breath. 11/20/20   Noreene Larsson, NP  doxycycline (VIBRA-TABS) 100 MG tablet Take 1 tablet (100 mg total) by mouth 2 (two) times daily. 05/20/21   Noreene Larsson, NP  FLUoxetine (PROZAC) 40 MG capsule Take 1 capsule (40 mg total) by mouth daily. 08/07/21   Cloria Spring, MD  lubiprostone [facility]) 8 MCG capsule Take 1 capsule (8 mcg total) by mouth daily with breakfast. 10/29/20   Annitta Needs, NP  meclizine (ANTIVERT)  25 MG tablet Take 1 tablet (25 mg total) by mouth 2 (two) times daily as needed for dizziness. 04/29/21   Fayrene Helper, MD  methylphenidate (CONCERTA) 36 MG PO CR tablet Take 1 tablet (36 mg total) by mouth daily. 08/07/21 08/07/22  Cloria Spring, MD  neomycin-polymyxin-hydrocortisone (CORTISPORIN) OTIC solution Place 4 drops into the left ear 4 (four) times daily. 05/20/21   Noreene Larsson, NP  omeprazole (PRILOSEC) 20 MG capsule Take one tablet daily in the evening. 11/20/20   Noreene Larsson, NP  rizatriptan (MAXALT) 5 MG tablet Take 1 tablet (5 mg total) by mouth as needed for migraine. May repeat in 2 hours if needed 11/22/19   Perlie Mayo, NP  Saccharomyces boulardii (PROBIOTIC) 250 MG CAPS Take 1 capsule by mouth at bedtime. 11/22/19   Perlie Mayo, NP    Allergies    Codeine  Review of Systems   Review of Systems  HENT:  Positive for ear pain.   All other systems reviewed and are negative.  Physical Exam Updated Vital Signs BP 132/78 (BP Location: Right Arm)    Pulse 94    Temp 97.9 F (36.6 C) (Oral)    Resp 16    Ht 5' (1.524 m)    Wt 84.8 kg    LMP 09/19/2021 (Exact Date)    SpO2 100%    BMI 36.52 kg/m   Physical Exam Vitals and nursing note reviewed.  Constitutional:      General: She is not in acute distress.    Appearance: Normal appearance. She is not ill-appearing, toxic-appearing or diaphoretic.  HENT:     Head: Normocephalic and atraumatic.     Ears:     Comments: The left TM is erythematous and bulging. Pulmonary:     Effort: Pulmonary effort is normal.  Skin:    General: Skin is warm and dry.  Neurological:     Mental Status: She is alert and oriented to person, place, and time.    ED Results / Procedures / Treatments   Labs (all labs ordered are listed, but only abnormal results are displayed) Labs Reviewed - No data to display  EKG None  Radiology No results  found.  Procedures Procedures   Medications Ordered in ED Medications - No  data to display  ED Course  I have reviewed the triage vital signs and the nursing notes.  Pertinent labs & imaging results that were available during my care of the patient were reviewed by me and considered in my medical decision making (see chart for details).    MDM Rules/Calculators/A&P  Patient with left otitis media.  This will be treated with amoxicillin, ibuprofen, and follow-up as needed.  Final Clinical Impression(s) / ED Diagnoses Final diagnoses:  None    Rx / DC Orders ED Discharge Orders     None        Veryl Speak, MD 10/03/21 0008

## 2021-10-03 NOTE — Discharge Instructions (Signed)
Begin taking amoxicillin as prescribed.  Take ibuprofen 600 mg every 6 hours as needed for pain.  Follow-up with primary doctor if not improving in the next few days.

## 2021-10-05 ENCOUNTER — Ambulatory Visit (INDEPENDENT_AMBULATORY_CARE_PROVIDER_SITE_OTHER): Payer: Medicaid Other | Admitting: Nurse Practitioner

## 2021-10-05 ENCOUNTER — Encounter: Payer: Self-pay | Admitting: Nurse Practitioner

## 2021-10-05 ENCOUNTER — Telehealth: Payer: Self-pay

## 2021-10-05 ENCOUNTER — Other Ambulatory Visit: Payer: Self-pay

## 2021-10-05 VITALS — BP 128/81 | HR 94 | Ht 60.0 in | Wt 178.1 lb

## 2021-10-05 DIAGNOSIS — M542 Cervicalgia: Secondary | ICD-10-CM | POA: Diagnosis not present

## 2021-10-05 DIAGNOSIS — H6692 Otitis media, unspecified, left ear: Secondary | ICD-10-CM

## 2021-10-05 MED ORDER — NEOMYCIN-POLYMYXIN-HC 3.5-10000-1 OT SOLN: 4.0000 [drp] | Freq: Four times a day (QID) | OTIC | 0 refills | Status: DC

## 2021-10-05 MED ORDER — FLUTICASONE PROPIONATE 50 MCG/ACT NA SUSP: NASAL | 6 refills | Status: DC

## 2021-10-05 NOTE — Assessment & Plan Note (Addendum)
-  to left ear -she had ear infection in August, and now she is having another ear infection -at ED on 10/02/21, her left TM was erythematous and bulging and she was treated with amoxicillin, but today she states her hearing is worse and she is still in pain; she had pain when touching her pinna to inspect her TM -Rx. Cortisporin otic drops and flonase; will treat for otitis externa since she has pinna pain and flonase in case she has fluid in middle ear that is altering her hearing -Pt requests ENT referral since her infections have been recurrent; will honor that request

## 2021-10-05 NOTE — Telephone Encounter (Signed)
Transition Care Management Follow-up Telephone Call Date of discharge and from where: 10/02/2021 from  How have you been since you were released from the hospital? Pt stated that her hearing has worsened since starting the abx. Pt stated that a warm compress does help with the pain.  Any questions or concerns? No  Items Reviewed: Did the pt receive and understand the discharge instructions provided? Yes  Medications obtained and verified? Yes  Other? No  Any new allergies since your discharge? No  Dietary orders reviewed? No Do you have support at home? Yes   Functional Questionnaire: (I = Independent and D = Dependent) ADLs: I  Bathing/Dressing- I  Meal Prep- I  Eating- I  Maintaining continence- I  Transferring/Ambulation- I  Managing Meds- I   Follow up appointments reviewed:  PCP Hospital f/u appt confirmed? No   Specialist Hospital f/u appt confirmed? No   Are transportation arrangements needed? No  If their condition worsens, is the pt aware to call PCP or go to the Emergency Dept.? Yes Was the patient provided with contact information for the PCP's office or ED? Yes Was to pt encouraged to call back with questions or concerns? Yes

## 2021-10-05 NOTE — Progress Notes (Signed)
Acute Office Visit  Subjective:    Patient ID: Lacey Hartman, female    DOB: 1995-12-09, 25 y.o.   MRN: [id number]  Chief Complaint  Patient presents with   Ear Pain    Ear pain x1 week    HPI Patient is in today for ear pain. She was seen in the ED on 12/16, and at that time she had left TM that was erythematous and bulging. She was treated with amoxicillin and ibuprofen.  She states that her hearing is worse today and she feels like she is in a barrel.   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 Three primary cancers   Cancer Maternal Uncle        throat   ADD / ADHD Brother    Breast cancer Brother    ADD / ADHD Brother    Asthma Brother    Allergic rhinitis Brother    Cancer Paternal Grandmother        breast   Angioedema Neg Hx    Atopy Neg Hx    Eczema Neg Hx    Immunodeficiency Neg Hx    Urticaria Neg Hx     Social History   Socioeconomic History   Marital status: Significant Other    Spouse name: [name]   Number of children: 3   Years of education: Not on file   Highest education level: 9th grade  Occupational History   Not on file  Tobacco Use   Smoking status: Former    Packs/day: 0.33    Years: 7.00    Pack years: 2.31    Types: Cigarettes     Passive exposure: Current   Smokeless tobacco: Never  Vaping Use   Vaping Use: Never used  Substance and Sexual Activity   Alcohol use: No    Alcohol/week: 0.0 standard drinks   Drug use: No   Sexual activity: Yes    Birth control/protection: None  Other Topics Concern   Not on file  Social History Narrative   Lives with [name] and 3 children   [name]   [name]      Enjoys: playing with kids, going outside, beach, crafts      Diet: does not eat a lot of veggies or fruits, a lot of junk    Caffeine: 15 sodas   Water: 4-5 cups daily      Wears seat belt    Smoke detectors   Does not use phone while driving  Social Determinants of Health   Financial Resource Strain: Not on file  Food Insecurity: Not on file  Transportation Needs: Not on file  Physical Activity: Not on file  Stress: Not on file  Social Connections: Not on file  Intimate Partner Violence: Not on file    Outpatient Medications Prior to Visit  Medication Sig Dispense Refill   albuterol (VENTOLIN HFA) 108 (90 Base) MCG/ACT inhaler Inhale 1-2 puffs into the lungs every 4 (four) hours as needed for wheezing or shortness of breath. 18 g 1   amoxicillin (AMOXIL) 500 MG capsule Take 1 capsule (500 mg total) by mouth 3 (three) times daily. 21 capsule 0   doxycycline (VIBRA-TABS) 100 MG tablet Take 1 tablet (100 mg total) by mouth 2 (two) times daily. 20 tablet 0   FLUoxetine (PROZAC) 40 MG capsule Take 1 capsule (40 mg total) by mouth daily. 30 capsule 2   lubiprostone (AMITIZA) 8 MCG capsule Take 1 capsule (8 mcg total) by mouth daily with breakfast. 60 capsule 3   meclizine (ANTIVERT) 25 MG tablet Take 1 tablet (25 mg total) by mouth 2 (two) times daily as needed for dizziness. 14 tablet 0   methylphenidate (CONCERTA) 36 MG PO CR tablet Take 1 tablet (36 mg total) by mouth daily. 30 tablet 0   omeprazole (PRILOSEC) 20 MG capsule Take one tablet daily in the evening. 90 capsule 1   rizatriptan (MAXALT)  5 MG tablet Take 1 tablet (5 mg total) by mouth as needed for migraine. May repeat in 2 hours if needed 10 tablet 0   Saccharomyces boulardii (PROBIOTIC) 250 MG CAPS Take 1 capsule by mouth at bedtime. 30 capsule 1   neomycin-polymyxin-hydrocortisone (CORTISPORIN) OTIC solution Place 4 drops into the left ear 4 (four) times daily. 10 mL 0   No facility-administered medications prior to visit.    Allergies  Allergen Reactions   Codeine Hives, Swelling and Rash    & hallucinations/    Review of Systems  Constitutional: Negative.   HENT:  Positive for ear pain and hearing loss.        Left      Objective:    Physical Exam Constitutional:      Appearance: Normal appearance.  HENT:     Ears:     Comments: Left ear has pain to touch when trying to inspect TM, some erythema to TM Neurological:     Mental Status: She is alert.    BP 128/81    Pulse 94    Ht 5' (1.524 m)    Wt 178 lb 1.9 oz (80.8 kg)    LMP 09/19/2021 (Exact Date)    SpO2 98%    BMI 34.79 kg/m  Wt Readings from Last 3 Encounters:  10/05/21 178 lb 1.9 oz (80.8 kg)  10/02/21 187 lb (84.8 kg)  05/20/21 181 lb (82.1 kg)    Health Maintenance Due  Topic Date Due   COVID-19 Vaccine (1) Never done   Hepatitis C Screening  Never done   INFLUENZA VACCINE  05/18/2021    There are no preventive care reminders to display for this patient.   Lab Results  Component Value Date   TSH 1.780 07/29/2020   Lab Results  Component Value Date   WBC 8.5 07/29/2020   HGB 13.0 07/29/2020   HCT 40.0 07/29/2020   MCV 93 07/29/2020   PLT 221 07/29/2020   Lab Results  Component Value Date   NA 141 07/29/2020   K 4.4  07/29/2020   CO2 21 07/29/2020   GLUCOSE 85 07/29/2020   BUN 9 07/29/2020   CREATININE 0.80 07/29/2020   BILITOT 0.3 07/29/2020   ALKPHOS 89 07/29/2020   AST 20 07/29/2020   ALT 17 07/29/2020   PROT 6.7 07/29/2020   ALBUMIN 4.4 07/29/2020   CALCIUM 9.5 07/29/2020   Lab Results  Component Value Date    CHOL 147 07/24/2019   Lab Results  Component Value Date   HDL 44 07/24/2019   Lab Results  Component Value Date   LDLCALC 89 07/24/2019   Lab Results  Component Value Date   TRIG 73 07/24/2019   Lab Results  Component Value Date   CHOLHDL 3.3 07/24/2019   No results found for: HGBA1C     Assessment & Plan:   Problem List Items Addressed This Visit       Nervous and Auditory   Otitis - Primary    -to left ear -she had ear infection in August, and now she is having another ear infection -at ED on 10/02/21, her left TM was erythematous and bulging and she was treated with amoxicillin, but today she states her hearing is worse and she is still in pain; she had pain when touching her pinna to inspect her TM -Rx. Cortisporin otic drops and flonase; will treat for otitis externa since she has pinna pain and flonase in case she has fluid in middle ear that is altering her hearing -Pt requests ENT referral since her infections have been recurrent; will honor that request       Relevant Medications   fluticasone (FLONASE) 50 MCG/ACT nasal spray   neomycin-polymyxin-hydrocortisone (CORTISPORIN) OTIC solution   Other Relevant Orders   Ambulatory referral to ENT     Other   Neck pain    -hears popping with rotation and has pain with limited ROM -referral to ortho -discussed using OTC tylenol or NSAIDs for pain until she sees ortho      Relevant Orders   Ambulatory referral to Orthopedic Surgery     Meds ordered this encounter  Medications   fluticasone (FLONASE) 50 MCG/ACT nasal spray    Sig: Use 2 sprays in each nostril BID for a week. After 1 week, decrease to 1 spray in each nostril BID as needed for congestion/allergies.    Dispense:  16 g    Refill:  6   neomycin-polymyxin-hydrocortisone (CORTISPORIN) OTIC solution    Sig: Place 4 drops into the left ear 4 (four) times daily.    Dispense:  10 mL    Refill:  0     Noreene Larsson, NP

## 2021-10-05 NOTE — Assessment & Plan Note (Addendum)
-  hears popping with rotation and has pain with limited ROM -referral to ortho -discussed using OTC tylenol or NSAIDs for pain until she sees ortho

## 2021-10-13 ENCOUNTER — Other Ambulatory Visit: Payer: Self-pay

## 2021-10-13 ENCOUNTER — Emergency Department (HOSPITAL_COMMUNITY)
Admission: EM | Admit: 2021-10-13 | Discharge: 2021-10-13 | Disposition: A | Discharge status: Home / Self Care | Payer: Medicaid Other | Attending: Emergency Medicine | Admitting: Emergency Medicine

## 2021-10-13 DIAGNOSIS — Z28310 Unvaccinated for covid-19: Secondary | ICD-10-CM | POA: Insufficient documentation

## 2021-10-13 DIAGNOSIS — J45909 Unspecified asthma, uncomplicated: Secondary | ICD-10-CM | POA: Insufficient documentation

## 2021-10-13 DIAGNOSIS — U071 COVID-19: Secondary | ICD-10-CM | POA: Diagnosis not present

## 2021-10-13 DIAGNOSIS — H1032 Unspecified acute conjunctivitis, left eye: Secondary | ICD-10-CM | POA: Diagnosis not present

## 2021-10-13 DIAGNOSIS — R059 Cough, unspecified: Secondary | ICD-10-CM | POA: Diagnosis present

## 2021-10-13 DIAGNOSIS — Z87891 Personal history of nicotine dependence: Secondary | ICD-10-CM | POA: Insufficient documentation

## 2021-10-13 DIAGNOSIS — H10022 Other mucopurulent conjunctivitis, left eye: Secondary | ICD-10-CM

## 2021-10-13 LAB — RESP PANEL BY RT-PCR (FLU A&B, COVID) ARPGX2
Influenza A by PCR: NEGATIVE
Influenza B by PCR: NEGATIVE
SARS Coronavirus 2 by RT PCR: POSITIVE — AB

## 2021-10-13 MED ORDER — TOBRAMYCIN 0.3 % OP SOLN
2.0000 [drp] | Freq: Once | OPHTHALMIC | Status: AC
  Administered 2021-10-13: 18:00:00 2 [drp] via OPHTHALMIC
  Filled 2021-10-13: qty 5

## 2021-10-13 NOTE — Discharge Instructions (Signed)
Rest make sure you are drinking plenty of fluids.  Recommend taking Tylenol or Motrin to help you with body aches and fever, you will need to remain in quarantine through Sunday to avoid spreading COVID to other people.  Get rechecked immediately if you develop any severe weakness or if you develop shortness of breath.  You have been given antibiotics for your pinkeye, place 1 drop in both eyes every 4 hours while awake for the next 5 days.  Avoid rubbing your eyes is much as possible, wash your hands frequently to avoid spreading this infection as well.

## 2021-10-13 NOTE — ED Notes (Signed)
Walked patient around the room.  Patient maintained O2 saturation at 100% and denies any shortness of breath.

## 2021-10-13 NOTE — ED Provider Notes (Signed)
[facility] EMERGENCY DEPARTMENT Provider Note   CSN: [id number] Arrival date & time: 10/13/21  1548     History Chief Complaint  Patient presents with   Flu Like Symptoms    Lacey Hartman is a 25 y.o. female with a history including asthma, ADHD and anxiety, hearing impaired, using hearing aid in her right ear presenting for evaluation of flulike symptoms including generalized body aches, nonproductive cough, runny nose and a mild sore throat.  She also reports red left eye along with crusting along her lashes with persistent drainage which also started yesterday.  Her daughter who is also here with similar symptoms also has similar eye complaints.  She denies any fevers or chills, has had no nausea or vomiting, diarrhea.  She has had a generalized headache, denies neck pain.  She has not COVID or flu vaccinated. The history is provided by the patient.      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    Patient Active Problem List   Diagnosis Date Noted   Neck pain 10/05/2021   Otitis 05/20/2021   Vertigo 04/29/2021   Vaccine counseling 04/29/2021   Dysuria 11/20/2020   Vaginal odor 11/20/2020   IBS (irritable bowel syndrome) 07/23/2020   Impacted cerumen of right ear 07/18/2020   Bloated abdomen 06/11/2020   Axillary tenderness, left 12/20/2019   Breast tenderness in female 12/20/2019   Lump in lower inner quadrant of right breast 12/20/2019   Family history of breast cancer 12/20/2019   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   GERD (gastroesophageal reflux disease) 11/22/2019    Cluster headache, not intractable 11/22/2019   Psychophysiological insomnia 11/22/2019   Anxiety 11/22/2019   Mild asthma 11/22/2019   Obesity (BMI 30-39.9) 11/22/2019   Depression 06/10/2016   ADD (attention deficit disorder) 12/24/2015    Past Surgical History:  Procedure Laterality Date   NO PAST SURGERIES       OB History     Gravida  3   Para  2   Term  1   Preterm  1   AB  1   Living  2      SAB  1   IAB  0   Ectopic  0   Multiple  0   Live Births  2           Family History  Problem Relation Age of Onset   Depression Mother    Anxiety disorder Mother    Bipolar disorder Mother    Asthma Mother    Cancer Maternal Grandmother        breast cancer, lung cancer X 2, colon cancer. Three primary cancers   Cancer Maternal Uncle        throat   ADD / ADHD Brother    Breast cancer Brother    ADD / ADHD Brother    Asthma Brother    Allergic rhinitis Brother  Cancer Paternal Grandmother        breast   Angioedema Neg Hx    Atopy Neg Hx    Eczema Neg Hx    Immunodeficiency Neg Hx    Urticaria Neg Hx     Social History   Tobacco Use   Smoking status: Former    Packs/day: 0.33    Years: 7.00    Pack years: 2.31    Types: Cigarettes    Passive exposure: Current   Smokeless tobacco: Never  Vaping Use   Vaping Use: Never used  Substance Use Topics   Alcohol use: No    Alcohol/week: 0.0 standard drinks   Drug use: No    Home Medications Prior to Admission medications   Medication Sig Start Date End Date Taking? Authorizing Provider  albuterol (VENTOLIN HFA) 108 (90 Base) MCG/ACT inhaler Inhale 1-2 puffs into the lungs every 4 (four) hours as needed for wheezing or shortness of breath. 11/20/20   Noreene Larsson, NP  amoxicillin (AMOXIL) 500 MG capsule Take 1 capsule (500 mg total) by mouth 3 (three) times daily. 10/03/21   Veryl Speak, MD  doxycycline (VIBRA-TABS) 100 MG tablet Take 1 tablet (100 mg total) by mouth 2 (two) times daily.  05/20/21   Noreene Larsson, NP  FLUoxetine (PROZAC) 40 MG capsule Take 1 capsule (40 mg total) by mouth daily. 08/07/21   Cloria Spring, MD  fluticasone (FLONASE) 50 MCG/ACT nasal spray Use 2 sprays in each nostril BID for a week. After 1 week, decrease to 1 spray in each nostril BID as needed for congestion/allergies. 10/05/21   Noreene Larsson, NP  lubiprostone [facility]) 8 MCG capsule Take 1 capsule (8 mcg total) by mouth daily with breakfast. 10/29/20   Annitta Needs, NP  meclizine (ANTIVERT) 25 MG tablet Take 1 tablet (25 mg total) by mouth 2 (two) times daily as needed for dizziness. 04/29/21   Fayrene Helper, MD  methylphenidate (CONCERTA) 36 MG PO CR tablet Take 1 tablet (36 mg total) by mouth daily. 08/07/21 08/07/22  Cloria Spring, MD  neomycin-polymyxin-hydrocortisone (CORTISPORIN) OTIC solution Place 4 drops into the left ear 4 (four) times daily. 10/05/21   Noreene Larsson, NP  omeprazole (PRILOSEC) 20 MG capsule Take one tablet daily in the evening. 11/20/20   Noreene Larsson, NP  rizatriptan (MAXALT) 5 MG tablet Take 1 tablet (5 mg total) by mouth as needed for migraine. May repeat in 2 hours if needed 11/22/19   Perlie Mayo, NP  Saccharomyces boulardii (PROBIOTIC) 250 MG CAPS Take 1 capsule by mouth at bedtime. 11/22/19   Perlie Mayo, NP    Allergies    Codeine  Review of Systems   Review of Systems  Constitutional:  Negative for chills and fever.  HENT:  Positive for rhinorrhea and sore throat. Negative for congestion, ear pain, sinus pressure, trouble swallowing and voice change.   Eyes:  Positive for discharge and redness.  Respiratory:  Positive for cough. Negative for shortness of breath, wheezing and stridor.   Cardiovascular:  Negative for chest pain.  Gastrointestinal:  Negative for abdominal pain, diarrhea, nausea and vomiting.  Genitourinary: Negative.   Musculoskeletal:  Positive for myalgias. Negative for neck pain and neck stiffness.  Skin: Negative.    Neurological:  Positive for headaches.  All other systems reviewed and are negative.  Physical Exam Updated Vital Signs BP 100/86 (BP Location: Left Arm)    Pulse (!) 112  Temp 98.8 F (37.1 C) (Oral)    Resp 20    Ht 5' (1.524 m)    Wt 80.7 kg    LMP 09/19/2021 (Exact Date)    SpO2 100%    BMI 34.76 kg/m   Physical Exam Constitutional:      Appearance: She is well-developed.  HENT:     Head: Normocephalic and atraumatic.     Nose: Mucosal edema and rhinorrhea present.     Mouth/Throat:     Mouth: Mucous membranes are moist.     Pharynx: Oropharynx is clear. Uvula midline. Posterior oropharyngeal erythema present. No oropharyngeal exudate.     Tonsils: No tonsillar abscesses.     Comments: Mild posterior pharyngeal erythema, no exudate.  Uvula is midline.  No tonsillar hypertrophy.  No head or neck adenopathy. Eyes:     General:        Left eye: Discharge present.    Conjunctiva/sclera:     Left eye: Left conjunctiva is injected.  Cardiovascular:     Rate and Rhythm: Normal rate.     Heart sounds: Normal heart sounds.  Pulmonary:     Effort: Pulmonary effort is normal. No respiratory distress.     Breath sounds: No wheezing or rales.  Abdominal:     Palpations: Abdomen is soft.     Tenderness: There is no abdominal tenderness.  Musculoskeletal:        General: Normal range of motion.  Skin:    General: Skin is warm and dry.     Findings: No rash.  Neurological:     Mental Status: She is alert and oriented to person, place, and time.    ED Results / Procedures / Treatments   Labs (all labs ordered are listed, but only abnormal results are displayed) Labs Reviewed  RESP PANEL BY RT-PCR (FLU A&B, COVID) ARPGX2 - Abnormal; Notable for the following components:      Result Value   SARS Coronavirus 2 by RT PCR POSITIVE (*)    All other components within normal limits    EKG None  Radiology No results found.  Procedures Procedures   Medications Ordered in  ED Medications  tobramycin (TOBREX) 0.3 % ophthalmic solution 2 drop (2 drops Both Eyes Given 10/13/21 1810)    ED Course  I have reviewed the triage vital signs and the nursing notes.  Pertinent labs & imaging results that were available during my care of the patient were reviewed by me and considered in my medical decision making (see chart for details).    MDM Rules/Calculators/A&P                         Pt with covid 19, respiratory exam is normal with no respiratory distress, she ambulated without oxygen desaturation. She was encouraged rest, tylenol or motrin for sx relief.  Also with left conjunctivitis, given tobrex.  Plan prn f/u, strict return precautions outlined.  Also discussed home quarantine requirements.  CARYLE HELGESON was evaluated in Emergency Department on 10/13/2021 for the symptoms described in the history of present illness. She was evaluated in the context of the global COVID-19 pandemic, which necessitated consideration that the patient might be at risk for infection with the SARS-CoV-2 virus that causes COVID-19. Institutional protocols and algorithms that pertain to the evaluation of patients at risk for COVID-19 are in a state of rapid change based on information released by regulatory bodies including the CDC and federal and state  organizations. These policies and algorithms were followed during the patient's care in the ED.     Final Clinical Impression(s) / ED Diagnoses Final diagnoses:  [id number]  Pink eye disease of left eye    Rx / DC Orders ED Discharge Orders     None        Landis Martins 10/13/21 Parke Simmers, MD 10/14/21 [phone]

## 2021-10-13 NOTE — ED Triage Notes (Signed)
Pt arrives with c/o generalized body aches, cough, runny nose, and sore throat. Symptoms started yesterday. Pt denies fevers.

## 2021-10-14 ENCOUNTER — Telehealth: Payer: Self-pay

## 2021-10-14 NOTE — Telephone Encounter (Signed)
Transition Care Management Follow-up Telephone Call Date of discharge and from where: 10/13/2021 from [facility] How have you been since you were released from the hospital? Pt stated that she is feeling okay and did not have any questions or concerns at this time.  Any questions or concerns? No  Items Reviewed: Did the pt receive and understand the discharge instructions provided? Yes  Medications obtained and verified? Yes  Other? No  Any new allergies since your discharge? No  Dietary orders reviewed? No Do you have support at home? Yes   Functional Questionnaire: (I = Independent and D = Dependent) ADLs: I  Bathing/Dressing- I  Meal Prep- I  Eating- I  Maintaining continence- I  Transferring/Ambulation- I  Managing Meds- I   Follow up appointments reviewed:  PCP Hospital f/u appt confirmed? No   Specialist Hospital f/u appt confirmed? No   Are transportation arrangements needed? No  If their condition worsens, is the pt aware to call PCP or go to the Emergency Dept.? Yes Was the patient provided with contact information for the PCP's office or ED? Yes Was to pt encouraged to call back with questions or concerns? Yes

## 2021-10-22 ENCOUNTER — Ambulatory Visit: Payer: Medicaid Other | Admitting: Orthopaedic Surgery

## 2021-11-20 ENCOUNTER — Encounter: Payer: Medicaid Other | Admitting: Nurse Practitioner

## 2021-11-24 ENCOUNTER — Ambulatory Visit: Payer: Medicaid Other | Admitting: Nurse Practitioner

## 2021-11-27 ENCOUNTER — Other Ambulatory Visit: Payer: Self-pay | Admitting: Gastroenterology

## 2021-11-30 ENCOUNTER — Telehealth (INDEPENDENT_AMBULATORY_CARE_PROVIDER_SITE_OTHER): Payer: Medicaid Other | Admitting: Psychiatry

## 2021-11-30 ENCOUNTER — Encounter (HOSPITAL_COMMUNITY): Payer: Self-pay | Admitting: Psychiatry

## 2021-11-30 ENCOUNTER — Other Ambulatory Visit: Payer: Self-pay

## 2021-11-30 DIAGNOSIS — F331 Major depressive disorder, recurrent, moderate: Secondary | ICD-10-CM

## 2021-11-30 DIAGNOSIS — F900 Attention-deficit hyperactivity disorder, predominantly inattentive type: Secondary | ICD-10-CM | POA: Diagnosis not present

## 2021-11-30 MED ORDER — METHYLPHENIDATE HCL ER (OSM) 36 MG PO TBCR: 36.0000 mg | EXTENDED_RELEASE_TABLET | Freq: Every day | ORAL | 0 refills | Status: DC

## 2021-11-30 MED ORDER — TRAZODONE HCL 50 MG PO TABS: 50.0000 mg | ORAL_TABLET | Freq: Every day | ORAL | 2 refills | Status: DC

## 2021-11-30 MED ORDER — FLUOXETINE HCL 40 MG PO CAPS: 40.0000 mg | ORAL_CAPSULE | Freq: Every day | ORAL | 2 refills | Status: DC

## 2021-11-30 NOTE — Progress Notes (Signed)
Virtual Visit via Telephone Note  I connected with Lacey Hartman on 11/30/21 at 10:40 AM EST by telephone and verified that I am speaking with the correct person using two identifiers.  Location: Patient: home Provider: office   I discussed the limitations, risks, security and privacy concerns of performing an evaluation and management service by telephone and the availability of in person appointments. I also discussed with the patient that there may be a patient responsible charge related to this service. The patient expressed understanding and agreed to proceed.      I discussed the assessment and treatment plan with the patient. The patient was provided an opportunity to ask questions and all were answered. The patient agreed with the plan and demonstrated an understanding of the instructions.   The patient was advised to call back or seek an in-person evaluation if the symptoms worsen or if the condition fails to improve as anticipated.  I provided 15 minutes of non-face-to-face time during this encounter.   Levonne Spiller, MD  [facility] MD/PA/NP OP Progress Note  11/30/2021 11:07 AM Lacey Hartman  MRN:  [id number]  Chief Complaint:  Chief Complaint   Depression; ADD; Follow-up    HPI:  This patient is a 26 year old single white female lives with her boyfriend 2 daughters  and one son  in [place]. 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   The patient was referred by her primary physician, Dr. Sallee Lange, for further assessment and treatment of ADD and depression.   The patient states that she has always struggled in school. She doesn't know much about her history but does know that she was born full-term but spent most of her first year of life in the NICU at [facility]. She is not sure why. She was diagnosed with hearing loss and required speech therapy although way through school. She had trouble learning to read and write and despite being an exceptional  children's classes still does not read or write very well. When she was 26 years old in [place] she was very hyperactive and unfocused and distractible. At that time she was diagnosed with ADHD and was on Ritalin for a while and later Adderall. She stopped these medications when she became pregnant at age 26.   The patient states that she used to go to [facility] because of depression. The physician there put her on Prozac last year and she is still taking it although she has not been back to that clinic in quite some time. She states that she still has difficulties with bad temper crying spells anger anxiety and mood swings. At times she gets overwhelmed in caring for the children. Her husband has seizures and is on disability so he is able to be at home and help her most of the time. She also gets anxious and nervous around new people. She denies auditory or visual hallucinations or paranoia. She does not use drugs or alcohol. She is now using an Implanon implant to prevent pregnancy as she has had so many children in such a short amount of time.   The patient states she is interested in getting back on medication for ADHD. She's no longer hyperactive but very unfocused distractible and on able to finish tasks  The patient returns for follow-up after a long absence.  She was last seen about 10 months ago.  She had called for refills on her Prozac.  She states that the end of December her boyfriend's  aunt died.  They had been very close and this was difficult for her.  She also contracted COVID and several other family members have been sick as well.  She states since then she has been a little bit more depressed but not terribly so.  She still thinks that Prozac is helpful.  However she has not sleeping well and is having disturbing dreams particularly about her mother's death 10 years ago.  She would like to try something to help her sleep and we can try trazodone.  She has been off Concerta for months  because of not coming to visits and she states that it did help with her focus. Visit Diagnosis:    ICD-10-CM   1. Moderate episode of recurrent major depressive disorder (HCC)  F33.1     2. 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 Three primary cancers   Cancer Maternal Uncle        throat   ADD / ADHD Brother    Breast cancer Brother    ADD / ADHD Brother    Asthma Brother    Allergic rhinitis Brother    Cancer Paternal Grandmother        breast   Angioedema Neg Hx    Atopy Neg Hx    Eczema Neg Hx    Immunodeficiency Neg Hx    Urticaria Neg Hx     Social History:  Social History   Socioeconomic History   Marital status: Significant Other    Spouse name: [name]   Number of children: 3   Years of education: Not on file   Highest education level: 9th grade  Occupational History   Not on file  Tobacco Use   Smoking status: Former    Packs/day: 0.33    Years: 7.00    Pack years: 2.31    Types: Cigarettes    Passive exposure: Current   Smokeless tobacco: Never   Vaping Use   Vaping Use: Never used  Substance and Sexual Activity   Alcohol use: No    Alcohol/week: 0.0 standard drinks   Drug use: No   Sexual activity: Yes    Birth control/protection: None  Other Topics Concern   Not on file  Social History Narrative   Lives with [name] and 3 children   [name]  [name]      Enjoys: playing with kids, going outside, beach, crafts      Diet: does not eat a lot of veggies or fruits, a lot of junk    Caffeine: 15 sodas   Water: 4-5 cups daily      Wears seat belt    Smoke detectors   Does not use phone while driving    Social Determinants of Health   Financial Resource Strain: Not on file  Food Insecurity: Not on file  Transportation Needs: Not on file  Physical Activity: Not on file  Stress: Not on file  Social Connections: Not on file    Allergies:  Allergies  Allergen Reactions   Codeine Hives, Swelling and Rash    & hallucinations/    Metabolic Disorder Labs: No results found for: HGBA1C, MPG No results found for: PROLACTIN Lab Results  Component Value Date   CHOL 147 07/24/2019   TRIG 73 07/24/2019   HDL 44 07/24/2019   CHOLHDL 3.3 07/24/2019   LDLCALC 89 07/24/2019   LDLCALC 85 02/16/2017   Lab Results  Component Value Date   TSH 1.780 07/29/2020   TSH 1.700 02/16/2017    Therapeutic Level Labs: No results found for: LITHIUM No results found for: VALPROATE No components found for:  CBMZ  Current Medications: Current Outpatient Medications  Medication Sig Dispense Refill   methylphenidate (CONCERTA) 36 MG PO CR tablet Take 1 tablet (36 mg total) by mouth daily. 30 tablet 0   methylphenidate (CONCERTA) 36 MG PO CR tablet Take 1 tablet (36 mg total) by mouth daily. 30 tablet 0   traZODone (DESYREL) 50 MG tablet Take 1 tablet (50 mg total) by mouth at bedtime. 30 tablet 2   albuterol (VENTOLIN HFA) 108 (90 Base) MCG/ACT inhaler Inhale 1-2 puffs into the lungs every 4 (four) hours as needed for  wheezing or shortness of breath. 18 g 1   amoxicillin (AMOXIL) 500 MG capsule Take 1 capsule (500 mg total) by mouth 3 (three) times daily. 21 capsule 0   doxycycline (VIBRA-TABS) 100 MG tablet Take 1 tablet (100 mg total) by mouth 2 (two) times daily. 20 tablet 0   FLUoxetine (PROZAC) 40 MG capsule Take 1 capsule (40 mg total) by mouth daily. 30 capsule 2   fluticasone (FLONASE) 50 MCG/ACT nasal spray Use 2 sprays in each nostril BID for a week. After 1 week, decrease to 1 spray in each nostril BID as needed for congestion/allergies. 16 g 6   lubiprostone (AMITIZA) 8 MCG capsule One tablet once to twice daily with food for constipation. NEED OFFICE VISIT FOR MORE REFILLS 60 capsule 3   meclizine (ANTIVERT) 25 MG tablet Take 1 tablet (25 mg total) by mouth 2 (two) times daily as needed for dizziness. 14 tablet 0   methylphenidate (CONCERTA) 36 MG PO CR tablet Take 1 tablet (36 mg total) by mouth daily. 30 tablet 0   neomycin-polymyxin-hydrocortisone (CORTISPORIN) OTIC solution Place 4 drops into the left ear 4 (four) times daily. 10 mL 0   omeprazole (PRILOSEC) 20 MG capsule Take one tablet daily in the evening. 90 capsule 1   rizatriptan (MAXALT) 5 MG tablet Take 1 tablet (5 mg total) by mouth as needed for migraine. May repeat in 2 hours if needed 10 tablet 0   Saccharomyces boulardii (PROBIOTIC) 250 MG CAPS Take 1 capsule by mouth at bedtime. 30 capsule 1   No current facility-administered medications for this visit.  Musculoskeletal: Strength & Muscle Tone: na Gait & Station: na Patient leans: N/A  Psychiatric Specialty Exam: Review of Systems  Psychiatric/Behavioral:  Positive for sleep disturbance.   All other systems reviewed and are negative.  There were no vitals taken for this visit.There is no height or weight on file to calculate BMI.  General Appearance: NA  Eye Contact:  NA  Speech:  Clear and Coherent  Volume:  Normal  Mood:  Euthymic  Affect:  NA  Thought Process:   Goal Directed  Orientation:  Full (Time, Place, and Person)  Thought Content: Rumination   Suicidal Thoughts:  No  Homicidal Thoughts:  No  Memory:  Immediate;   Good Recent;   Good Remote;   Fair  Judgement:  Good  Insight:  Fair  Psychomotor Activity:  Normal  Concentration:  Concentration: Poor and Attention Span: Poor  Recall:  [place] of Knowledge: Fair  Language: Good  Akathisia:  No  Handed:  Right  AIMS (if indicated): not done  Assets:  Communication Skills Desire for Improvement Physical Health Resilience Social Support Talents/Skills  ADL's:  Intact  Cognition: Impaired,  Mild  Sleep:  Poor   Screenings: GAD-7    Flowsheet Row Video Visit from 03/11/2020 in [facility] Office Visit from 12/20/2019 in [facility] Office Visit from 11/22/2019 in [facility]  Total GAD-7 Score 5 18 14       PHQ2-9    Flowsheet Row Video Visit from 11/30/2021 in [facility] Visit from 10/05/2021 in [facility] Office Visit from 05/20/2021 in [facility] Video Visit from 04/29/2021 in [facility] Video Visit from 02/12/2021 in [facility]  PHQ-2 Total Score 1 0 0 0 0      Flowsheet Row Video Visit from 11/30/2021 in [facility] from 10/13/2021 in [facility] from 10/02/2021 in [facility] No Risk No Risk No Risk        Assessment and Plan: This patient is a 26 year old female with a history of mild cognitive impairment, hearing loss depression and ADD.  She has not had compliance with visits for a long time so she is not well on Concerta.  Consequently she is not focusing that well.  We will restart Concerta at 36 mg every morning for ADD.  She will continue Prozac 40 mg daily for depression.  She will start  trazodone 50 mg at bedtime to help with sleep.  She will return to see me in 3 months   Levonne Spiller, MD 11/30/2021, 11:07 AM

## 2021-12-03 ENCOUNTER — Other Ambulatory Visit: Payer: Self-pay

## 2021-12-03 ENCOUNTER — Encounter: Payer: Self-pay | Admitting: Nurse Practitioner

## 2021-12-03 ENCOUNTER — Ambulatory Visit (INDEPENDENT_AMBULATORY_CARE_PROVIDER_SITE_OTHER): Payer: Medicaid Other | Admitting: Nurse Practitioner

## 2021-12-03 VITALS — BP 120/82 | HR 79 | Ht 60.0 in | Wt 181.1 lb

## 2021-12-03 DIAGNOSIS — N644 Mastodynia: Secondary | ICD-10-CM

## 2021-12-03 DIAGNOSIS — R140 Abdominal distension (gaseous): Secondary | ICD-10-CM

## 2021-12-03 DIAGNOSIS — E669 Obesity, unspecified: Secondary | ICD-10-CM | POA: Diagnosis not present

## 2021-12-03 DIAGNOSIS — G44009 Cluster headache syndrome, unspecified, not intractable: Secondary | ICD-10-CM

## 2021-12-03 DIAGNOSIS — Z0000 Encounter for general adult medical examination without abnormal findings: Secondary | ICD-10-CM

## 2021-12-03 DIAGNOSIS — K219 Gastro-esophageal reflux disease without esophagitis: Secondary | ICD-10-CM

## 2021-12-03 DIAGNOSIS — Z139 Encounter for screening, unspecified: Secondary | ICD-10-CM | POA: Diagnosis not present

## 2021-12-03 MED ORDER — PROBIOTIC 250 MG PO CAPS: 1.0000 | ORAL_CAPSULE | Freq: Every day | ORAL | 1 refills | Status: DC

## 2021-12-03 MED ORDER — FLUOXETINE HCL 40 MG PO CAPS: 40.0000 mg | ORAL_CAPSULE | Freq: Every day | ORAL | 2 refills | Status: DC

## 2021-12-03 MED ORDER — OMEPRAZOLE 20 MG PO CPDR: DELAYED_RELEASE_CAPSULE | ORAL | 1 refills | Status: DC

## 2021-12-03 MED ORDER — MECLIZINE HCL 25 MG PO TABS: 25.0000 mg | ORAL_TABLET | Freq: Two times a day (BID) | ORAL | 0 refills | Status: DC | PRN

## 2021-12-03 MED ORDER — RIZATRIPTAN BENZOATE 5 MG PO TABS: 5.0000 mg | ORAL_TABLET | ORAL | 0 refills | Status: DC | PRN

## 2021-12-03 NOTE — Assessment & Plan Note (Signed)
Annual exam as documented.  ?Counseling done include healthy lifestyle involving committing to 150 minutes of exercise per week, heart healthy diet, and attaining healthy weight. The importance of adequate sleep also discussed.  ?Regular use of seat belt and home safety were also discussed . ?Changes in health habits are decided on by patient with goals and time frames set for achieving them. ?Immunization and cancer screening  needs are specifically addressed at this visit.   ?

## 2021-12-03 NOTE — Assessment & Plan Note (Addendum)
Non cyclical breast pain, left breast,lower outer quadrant  has family history of breast cancer, left breast US done two years ago was normal. [place] of left breast ordered today,  Use ibuprofen as needed for pain,  No sign of infection noted except for tenderness, skin warm and dry no erythema, no discharge.

## 2021-12-03 NOTE — Patient Instructions (Addendum)
°  Please get your covid vaccine at your pharmacy, get your flu vaccine today.  Use ibuprofen as needed for your breast pain, please get the ultrasound of your left breast done.   It is important that you exercise regularly at least 30 minutes 5 times a week.  Think about what you will eat, plan ahead. Choose " clean, green, fresh or frozen" over canned, processed or packaged foods which are more sugary, salty and fatty. 70 to 75% of food eaten should be vegetables and fruit. Three meals at set times with snacks allowed between meals, but they must be fruit or vegetables. Aim to eat over a 12 hour period , example 7 am to 7 pm, and STOP after  your last meal of the day. Drink water,generally about 64 ounces per day, no other drink is as healthy. Fruit juice is best enjoyed in a healthy way, by EATING the fruit.  Thanks for choosing [facility], we consider it a privelige to serve you.

## 2021-12-03 NOTE — Assessment & Plan Note (Signed)
Importance of healthy food choices with portion control discussed as well as eating regularly within 12  hour window.   The need to choose clean green food 50%-75% of time is discussed as well as make water the primary drink and set a goal for 64 ounces daily.  Patient reeducated about the importance of committment to minimum of 150 minutes of exercise per week.  Three meals at set times with snacks allowed between meals but they must be fruit or vegetable.   Aim to eat  over 12 hour period  for example 7 am to 7 pm. Stop after your last meal of the day.  Wt Readings from Last 3 Encounters:  12/03/21 181 lb 1.3 oz (82.1 kg)  10/13/21 178 lb (80.7 kg)  10/05/21 178 lb 1.9 oz (80.8 kg)

## 2021-12-03 NOTE — Progress Notes (Addendum)
New Patient Office Visit  Subjective:  Patient ID: Lacey Hartman, female    DOB: 1995/12/02  Age: 26 y.o. MRN: [id number]  CC:  Chief Complaint  Patient presents with   Annual Exam    CPE wants covid test for gastro doctor     HPI Lacey Hartman presents for annual physical exam. Pt c/o left breast tenderness, stated that her symptoms started gradually a month ago, she denies lump, fever , chills, skin changes, trauma, she is not lactating. She stated that she has breast US done 2  years ago, she requested for breast US then because she has family  history of breast cancer,    Pt stated that she tested positive COVID last month, she has IBS her gastro will not see her until she has another test done to make sure she does not currently have COVID. Pt denies cough, fever, chills, wheezing , sob.    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 Three primary cancers   Cancer Maternal Uncle        throat   ADD / ADHD Brother    Breast cancer Brother    ADD / ADHD Brother    Asthma Brother    Allergic rhinitis Brother    Cancer Paternal Grandmother        breast   Angioedema  Neg Hx    Atopy Neg Hx    Eczema Neg Hx    Immunodeficiency Neg Hx    Urticaria Neg Hx     Social History   Socioeconomic History   Marital status: Significant Other    Spouse name: [name]   Number of children: 3   Years of education: Not on file   Highest education level: 9th grade  Occupational History   Not on file  Tobacco Use   Smoking status: Former    Packs/day: 0.33    Years: 7.00    Pack years: 2.31    Types: Cigarettes    Passive exposure: Current   Smokeless tobacco: Never  Vaping Use   Vaping Use: Never used  Substance and Sexual Activity   Alcohol use: No    Alcohol/week: 0.0 standard drinks   Drug use: No   Sexual activity: Yes    Birth control/protection: None  Other Topics Concern   Not on file  Social History Narrative  Lives with [name] and 3 children   [name]   [name]      Enjoys: playing with kids, going outside, beach, crafts      Diet: does not eat a lot of veggies or fruits, a lot of junk    Caffeine: 15 sodas   Water: 4-5 cups daily      Wears seat belt    Smoke detectors   Does not use phone while driving    Social Determinants of Health   Financial Resource Strain: Not on file  Food Insecurity: Not on file  Transportation Needs: Not on file  Physical Activity: Not on file  Stress: Not on file  Social Connections: Not on file  Intimate Partner Violence: Not on file    ROS Review of Systems  Constitutional: Negative.   HENT:  Positive for hearing loss.        Deaf left ear, hearing aid right ear  Eyes: Negative.   Respiratory: Negative.    Cardiovascular: Negative.   Gastrointestinal: Negative.   Endocrine: Negative.   Genitourinary: Negative.   Musculoskeletal: Negative.   Skin: Negative.   Allergic/Immunologic: Negative.   Neurological: Negative.   Hematological: Negative.   Psychiatric/Behavioral: Negative.     Objective:   Today's Vitals: BP 120/82    Pulse 79    Ht 5' (1.524 m)    Wt 181 lb 1.3  oz (82.1 kg)    SpO2 98%    BMI 35.36 kg/m  Katie cma was chaperone  Physical Exam Constitutional:      General: She is not in acute distress.    Appearance: She is obese. She is not ill-appearing, toxic-appearing or diaphoretic.  HENT:     Head: Normocephalic.     Right Ear: Ear canal and external ear normal. There is no impacted cerumen.     Left Ear: Tympanic membrane, ear canal and external ear normal. There is no impacted cerumen.     Ears:     Comments: Right TM appears punctured    Nose: No congestion or rhinorrhea.     Mouth/Throat:     Mouth: Mucous membranes are moist.     Pharynx: Oropharynx is clear. No oropharyngeal exudate or posterior oropharyngeal erythema.  Eyes:     General: No scleral icterus.       Right eye: No discharge.        Left eye: No discharge.     Extraocular Movements: Extraocular movements intact.     Conjunctiva/sclera: Conjunctivae normal.     Pupils: Pupils are equal, round, and reactive to light.  Neck:     Vascular: No carotid bruit.  Cardiovascular:     Rate and Rhythm: Normal rate and regular rhythm.     Pulses: Normal pulses.     Heart sounds: No murmur heard.   No friction rub. No gallop.  Pulmonary:     Effort: Pulmonary effort is normal. No respiratory distress.     Breath sounds: Normal breath sounds. No stridor. No wheezing, rhonchi or rales.  Chest:     Chest wall: No mass, lacerations, deformity, swelling, tenderness or edema.  Breasts:    Tanner Score is 5.     Breasts are symmetrical.     Right: Normal. No swelling, bleeding, inverted nipple, mass, nipple discharge, skin change or tenderness.     Left: Tenderness present. No swelling, bleeding, inverted nipple, mass, nipple discharge or skin change.     Comments: Tenderness of left  breast , tenderness localized to lower outer quadrant.  Abdominal:     General: There is no distension.     Palpations: There is no mass.     Tenderness: There is no abdominal tenderness. There  is no right CVA tenderness, guarding or rebound.     Hernia: No hernia is present.  Musculoskeletal:        General: No swelling, tenderness, deformity or signs of injury.     Cervical back: Normal range of motion and neck supple. No rigidity or tenderness.     Right lower leg: No edema.     Left lower leg: No edema.  Lymphadenopathy:     Cervical: No cervical adenopathy.     Upper Body:     Right upper body: No supraclavicular, axillary or pectoral adenopathy.     Left upper body: No supraclavicular, axillary or pectoral adenopathy.  Skin:    General: Skin is warm and dry.     Capillary Refill: Capillary refill takes less than 2 seconds.     Coloration: Skin is not jaundiced or pale.     Findings: No bruising, erythema, lesion or rash.  Neurological:     Mental Status: She is alert and oriented to person, place, and time.     Cranial Nerves: No cranial nerve deficit.     Sensory: No sensory deficit.     Motor: No weakness.     Coordination: Coordination normal.     Gait: Gait normal.     Deep Tendon Reflexes: Reflexes normal.  Psychiatric:        Mood and Affect: Mood normal.        Behavior: Behavior normal.        Thought Content: Thought content normal.    Assessment & Plan:   Problem List Items Addressed This Visit   None   Outpatient Encounter Medications as of 12/03/2021  Medication Sig   albuterol (VENTOLIN HFA) 108 (90 Base) MCG/ACT inhaler Inhale 1-2 puffs into the lungs every 4 (four) hours as needed for wheezing or shortness of breath.   fluticasone (FLONASE) 50 MCG/ACT nasal spray Use 2 sprays in each nostril BID for a week. After 1 week, decrease to 1 spray in each nostril BID as needed for congestion/allergies.   lubiprostone (AMITIZA) 8 MCG capsule One tablet once to twice daily with food for constipation. NEED OFFICE VISIT FOR MORE REFILLS   methylphenidate (CONCERTA) 36 MG PO CR tablet Take 1 tablet (36 mg total) by mouth daily.   traZODone (DESYREL) 50 MG  tablet Take 1 tablet (50 mg total) by mouth at bedtime.   [DISCONTINUED] FLUoxetine (PROZAC) 40 MG capsule Take 1 capsule (40 mg total) by mouth daily.   [DISCONTINUED] meclizine (ANTIVERT) 25 MG tablet Take 1 tablet (25 mg total) by mouth 2 (two) times daily as needed for dizziness.   [DISCONTINUED] methylphenidate (CONCERTA) 36 MG PO CR tablet Take 1 tablet (36 mg total) by mouth daily.   [DISCONTINUED] methylphenidate (CONCERTA) 36 MG PO CR tablet Take 1 tablet (36 mg total) by mouth daily.   [DISCONTINUED] omeprazole (PRILOSEC) 20 MG capsule Take one tablet daily in the evening.   [DISCONTINUED] rizatriptan (MAXALT) 5 MG tablet Take 1 tablet (5 mg total) by mouth as needed for migraine. May repeat in 2 hours if needed   amoxicillin (AMOXIL) 500 MG capsule Take 1 capsule (500 mg total) by mouth 3 (three) times daily. (Patient not taking: Reported on 12/03/2021)   doxycycline (VIBRA-TABS) 100 MG  tablet Take 1 tablet (100 mg total) by mouth 2 (two) times daily. (Patient not taking: Reported on 12/03/2021)   neomycin-polymyxin-hydrocortisone (CORTISPORIN) OTIC solution Place 4 drops into the left ear 4 (four) times daily. (Patient not taking: Reported on 12/03/2021)   [DISCONTINUED] Saccharomyces boulardii (PROBIOTIC) 250 MG CAPS Take 1 capsule by mouth at bedtime. (Patient not taking: Reported on 12/03/2021)   No facility-administered encounter medications on file as of 12/03/2021.    Follow-up: No follow-ups on file.   Renee Rival, FNP

## 2021-12-05 LAB — NOVEL CORONAVIRUS, NAA: SARS-CoV-2, NAA: NOT DETECTED

## 2021-12-05 NOTE — Progress Notes (Signed)
Please review results with patient, she is COVID-negative.

## 2021-12-08 DIAGNOSIS — Z0000 Encounter for general adult medical examination without abnormal findings: Secondary | ICD-10-CM | POA: Diagnosis not present

## 2021-12-09 ENCOUNTER — Other Ambulatory Visit: Payer: Self-pay | Admitting: Nurse Practitioner

## 2021-12-09 DIAGNOSIS — E559 Vitamin D deficiency, unspecified: Secondary | ICD-10-CM

## 2021-12-09 LAB — CBC WITH DIFFERENTIAL/PLATELET
Basophils Absolute: 0 10*3/uL (ref 0.0–0.2)
Basos: 0 %
EOS (ABSOLUTE): 0.1 10*3/uL (ref 0.0–0.4)
Eos: 1 %
Hematocrit: 39.2 % (ref 34.0–46.6)
Hemoglobin: 12.9 g/dL (ref 11.1–15.9)
Immature Grans (Abs): 0.1 10*3/uL (ref 0.0–0.1)
Immature Granulocytes: 1 %
Lymphocytes Absolute: 2.1 10*3/uL (ref 0.7–3.1)
Lymphs: 28 %
MCH: 30.3 pg (ref 26.6–33.0)
MCHC: 32.9 g/dL (ref 31.5–35.7)
MCV: 92 fL (ref 79–97)
Monocytes Absolute: 0.4 10*3/uL (ref 0.1–0.9)
Monocytes: 5 %
Neutrophils Absolute: 4.7 10*3/uL (ref 1.4–7.0)
Neutrophils: 65 %
Platelets: 245 10*3/uL (ref 150–450)
RBC: 4.26 x10E6/uL (ref 3.77–5.28)
RDW: 12.5 % (ref 11.7–15.4)
WBC: 7.3 10*3/uL (ref 3.4–10.8)

## 2021-12-09 LAB — CMP14+EGFR
ALT: 24 IU/L (ref 0–32)
AST: 17 IU/L (ref 0–40)
Albumin/Globulin Ratio: 1.9 (ref 1.2–2.2)
Albumin: 4.5 g/dL (ref 3.9–5.0)
Alkaline Phosphatase: 104 IU/L (ref 44–121)
BUN/Creatinine Ratio: 11 (ref 9–23)
BUN: 9 mg/dL (ref 6–20)
Bilirubin Total: 0.4 mg/dL (ref 0.0–1.2)
CO2: 22 mmol/L (ref 20–29)
Calcium: 9.4 mg/dL (ref 8.7–10.2)
Chloride: 105 mmol/L (ref 96–106)
Creatinine, Ser: 0.84 mg/dL (ref 0.57–1.00)
Globulin, Total: 2.4 g/dL (ref 1.5–4.5)
Glucose: 88 mg/dL (ref 70–99)
Potassium: 4.1 mmol/L (ref 3.5–5.2)
Sodium: 141 mmol/L (ref 134–144)
Total Protein: 6.9 g/dL (ref 6.0–8.5)
eGFR: 98 mL/min/{1.73_m2} (ref >59)

## 2021-12-09 LAB — LIPID PANEL
Chol/HDL Ratio: 3.4 ratio (ref 0.0–4.4)
Cholesterol, Total: 141 mg/dL (ref 100–199)
HDL: 41 mg/dL (ref >39)
LDL Chol Calc (NIH): 83 mg/dL (ref 0–99)
Triglycerides: 86 mg/dL (ref 0–149)
VLDL Cholesterol Cal: 17 mg/dL (ref 5–40)

## 2021-12-09 LAB — TSH: TSH: 1.4 u[IU]/mL (ref 0.450–4.500)

## 2021-12-09 LAB — HEMOGLOBIN A1C
Est. average glucose Bld gHb Est-mCnc: 100 mg/dL
Hgb A1c MFr Bld: 5.1 % (ref 4.8–5.6)

## 2021-12-09 LAB — VITAMIN D 25 HYDROXY (VIT D DEFICIENCY, FRACTURES): Vit D, 25-Hydroxy: 26.5 ng/mL — ABNORMAL LOW (ref 30.0–100.0)

## 2021-12-09 MED ORDER — VITAMIN D3 25 MCG (1000 UT) PO CAPS
1000.0000 [IU] | ORAL_CAPSULE | Freq: Every day | ORAL | 3 refills | Status: DC
Start: 2021-12-09 — End: 2023-02-17

## 2021-12-09 NOTE — Progress Notes (Signed)
Vitamin d level is low, take vitamin d 1000 units daily Other labs are normal.  Please schedule pt for a 6 mths follow up for her depression

## 2021-12-22 ENCOUNTER — Encounter (HOSPITAL_COMMUNITY): Payer: Self-pay

## 2021-12-22 ENCOUNTER — Ambulatory Visit (HOSPITAL_COMMUNITY): Admission: RE | Admit: 2021-12-22 | Payer: Medicaid Other | Source: Ambulatory Visit

## 2022-01-07 ENCOUNTER — Other Ambulatory Visit (HOSPITAL_COMMUNITY): Payer: Self-pay | Admitting: Psychiatry

## 2022-01-07 MED ORDER — METHYLPHENIDATE HCL ER (OSM) 36 MG PO TBCR: 36.0000 mg | EXTENDED_RELEASE_TABLET | Freq: Every day | ORAL | 0 refills | Status: DC

## 2022-01-28 ENCOUNTER — Other Ambulatory Visit: Payer: Self-pay

## 2022-01-28 ENCOUNTER — Encounter (HOSPITAL_COMMUNITY): Payer: Self-pay | Admitting: Emergency Medicine

## 2022-01-28 ENCOUNTER — Emergency Department (HOSPITAL_COMMUNITY): Payer: Medicaid Other

## 2022-01-28 DIAGNOSIS — S8001XA Contusion of right knee, initial encounter: Secondary | ICD-10-CM | POA: Insufficient documentation

## 2022-01-28 DIAGNOSIS — M25561 Pain in right knee: Secondary | ICD-10-CM | POA: Diagnosis not present

## 2022-01-28 DIAGNOSIS — W19XXXA Unspecified fall, initial encounter: Secondary | ICD-10-CM | POA: Insufficient documentation

## 2022-01-28 NOTE — ED Triage Notes (Addendum)
Pt c/o fall x 6 days ago and has had right knee pain since. Swelling noted. No bruising noted. Has been taking ibuprofen but none today. Nad.  ?

## 2022-01-29 ENCOUNTER — Emergency Department (HOSPITAL_COMMUNITY)
Admission: EM | Admit: 2022-01-29 | Discharge: 2022-01-29 | Disposition: A | Discharge status: Home / Self Care | Payer: Medicaid Other | Attending: Emergency Medicine | Admitting: Emergency Medicine

## 2022-01-29 ENCOUNTER — Emergency Department (HOSPITAL_COMMUNITY): Payer: Medicaid Other

## 2022-01-29 DIAGNOSIS — M25561 Pain in right knee: Secondary | ICD-10-CM | POA: Diagnosis not present

## 2022-01-29 DIAGNOSIS — S8001XA Contusion of right knee, initial encounter: Secondary | ICD-10-CM

## 2022-01-29 NOTE — ED Notes (Signed)
Pt ambulatory from [facility] to  treatment room 1 ?

## 2022-01-29 NOTE — ED Provider Notes (Addendum)
?[facility] ?Provider Note ? ? ?CSN: [id number] ?Arrival date & time: 01/28/22  2316 ? ?  ? ?History ? ?Chief Complaint  ?Patient presents with  ? Knee Pain  ? ? ?Lacey Hartman is a 26 y.o. female. ? ?The history is provided by the patient.  ?Knee Pain ? ?  ? ?Home Medications ?Prior to Admission medications   ?Medication Sig Start Date End Date Taking? Authorizing Provider  ?albuterol (VENTOLIN HFA) 108 (90 Base) MCG/ACT inhaler Inhale 1-2 puffs into the lungs every 4 (four) hours as needed for wheezing or shortness of breath. 11/20/20   Noreene Larsson, NP  ?Cholecalciferol (VITAMIN D3) 25 MCG (1000 UT) CAPS Take 1 capsule (1,000 Units total) by mouth daily. 12/09/21   Renee Rival, FNP  ?FLUoxetine (PROZAC) 40 MG capsule Take 1 capsule (40 mg total) by mouth daily. 12/03/21   Renee Rival, FNP  ?fluticasone (FLONASE) 50 MCG/ACT nasal spray Use 2 sprays in each nostril BID for a week. After 1 week, decrease to 1 spray in each nostril BID as needed for congestion/allergies. 10/05/21   Noreene Larsson, NP  ?lubiprostone (AMITIZA) 8 MCG capsule One tablet once to twice daily with food for constipation. NEED OFFICE VISIT FOR MORE REFILLS 11/27/21   Mahala Menghini, PA-C  ?meclizine (ANTIVERT) 25 MG tablet Take 1 tablet (25 mg total) by mouth 2 (two) times daily as needed for dizziness. 12/03/21   Renee Rival, FNP  ?methylphenidate (CONCERTA) 36 MG PO CR tablet Take 1 tablet (36 mg total) by mouth daily. 01/07/22 01/07/23  Cloria Spring, MD  ?omeprazole (PRILOSEC) 20 MG capsule Take one tablet daily in the evening. 12/03/21   Renee Rival, FNP  ?rizatriptan (MAXALT) 5 MG tablet Take 1 tablet (5 mg total) by mouth as needed for migraine. May repeat in 2 hours if needed 12/03/21   Renee Rival, FNP  ?Saccharomyces boulardii (PROBIOTIC) 250 MG CAPS Take 1 capsule by mouth at bedtime. 12/03/21   Renee Rival, FNP  ?traZODone (DESYREL) 50 MG tablet Take 1 tablet (50 mg  total) by mouth at bedtime. 11/30/21   Cloria Spring, MD  ?   ? ?Allergies    ?Codeine   ? ?Review of Systems   ?Review of Systems  ?All other systems reviewed and are negative. ? ?Physical Exam ?Updated Vital Signs ?BP (!) 143/86 (BP Location: Right Arm)   Pulse 89   Temp 98.6 ?F (37 ?C) (Oral)   Resp 16   LMP 01/04/2022 (Approximate)   SpO2 100%  ?Physical Exam ?Vitals and nursing note reviewed.  ?26 year old female, resting comfortably and in no acute distress. Vital signs are significant for slightly elevated blood pressure. Oxygen saturation is 100%, which is normal. ?Head is normocephalic and atraumatic. PERRLA, EOMI. Oropharynx is clear. ?Neck is nontender and supple without adenopathy or JVD. ?Back is nontender and there is no CVA tenderness. ?Lungs are clear without rales, wheezes, or rhonchi. ?Chest is nontender. ?Heart has regular rate and rhythm without murmur. ?Abdomen is soft, flat. ?Extremities: There is soft tissue swelling in the anterior aspect of the right knee overlying the patellar tendon.  No effusion is detected.  She is able to extend her right knee against gravity but there is pain with extension.  There is also pain with passive flexion.  There is no instability of the knee to valgus or varus stress. ?Skin is warm and dry without rash. ?Neurologic: Mental status  is normal, cranial nerves are intact, moves all extremities equally. ? ?ED Results / Procedures / Treatments   ? ?Radiology ?DG Knee Complete 4 Views Right ? ?Result Date: 01/29/2022 ?CLINICAL DATA:  Right-sided knee pain EXAM: RIGHT KNEE - COMPLETE 4+ VIEW COMPARISON:  02/12/2018 FINDINGS: No evidence of fracture, dislocation, or joint effusion. No evidence of arthropathy or other focal bone abnormality. Soft tissues are unremarkable. IMPRESSION: Negative. Electronically Signed   By: Donavan Foil M.D.   On: 01/29/2022 00:37   ? ?Procedures ?Procedures  ? ? ?Medications Ordered in ED ?Medications - No data to display ? ?ED  Course/ Medical Decision Making/ A&P ?  ?                        ?Medical Decision Making ?Amount and/or Complexity of Data Reviewed ?Radiology: ordered. ? ? ?Right knee injury, clinically appears to be a contusion.  No evidence on exam of internal derangement.  X-rays are obtained and show no evidence of fracture or joint effusion.  I am independently viewed the images, and agree with the radiologist's interpretation.  She is given a knee immobilizer for comfort, told to use crutches as needed.  Recommended ice and elevation, use over-the-counter NSAIDs and acetaminophen for pain.  She is referred to orthopedics for follow-up. ? ?Final Clinical Impression(s) / ED Diagnoses ?Final diagnoses:  ?Contusion of right knee, initial encounter  ? ? ?Rx / DC Orders ?ED Discharge Orders   ? ? None  ? ?  ? ? ?  ?Delora Fuel, MD ?23/76/28 0132 ? ?  ?Delora Fuel, MD ?31/51/76 0141 ? ?

## 2022-01-29 NOTE — Discharge Instructions (Signed)
Apply ice for 30 minutes at a time, 4 times a day. ? ?Wear the knee immobilizer as needed. ? ?Use crutches as needed. ? ?Follow-up with the orthopedic doctor if it is not improving. ? ?He may take ibuprofen or naproxen as needed for pain.  To get additional pain relief, add acetaminophen.  When you combine acetaminophen with either ibuprofen or naproxen, you get better pain relief than you get from taking either medication by itself. ?

## 2022-02-01 ENCOUNTER — Telehealth: Payer: Self-pay

## 2022-02-01 NOTE — Telephone Encounter (Signed)
Transition Care Management Unsuccessful Follow-up Telephone Call ? ?Date of discharge and from where:  01/29/2022 from [facility] ? ?Attempts:  1st Attempt ? ?Reason for unsuccessful TCM follow-up call:  Left voice message ? ? ? ?

## 2022-02-02 NOTE — Telephone Encounter (Signed)
Transition Care Management Follow-up Telephone Call ?Date of discharge and from where: 01/29/2022 from [facility] ?How have you been since you were released from the hospital? Patient stated that she is feeling better but she is still having pain in her right knee.  ?Any questions or concerns? No ? ?Items Reviewed: ?Did the pt receive and understand the discharge instructions provided? Yes  ?Medications obtained and verified? Yes  ?Other? No  ?Any new allergies since your discharge? No  ?Dietary orders reviewed? No ?Do you have support at home? Yes  ? ?Home Care and Equipment/Supplies: ? ?Were any new equipment or medical supplies ordered?  Yes: Knee brace and crutches ?What is the name of the medical supply agency? N/a ?Were you able to get the supplies/equipment? no ?Do you have any questions related to the use of the equipment or supplies? No ? ?Functional Questionnaire: (I = Independent and D = Dependent) ?ADLs: I ? ?Bathing/Dressing- I ? ?Meal Prep- I ? ?Eating- I ? ?Maintaining continence- I ? ?Transferring/Ambulation- I ? ?Managing Meds- I ? ? ?Follow up appointments reviewed: ? ?PCP Hospital f/u appt confirmed? No  Patient encouraged to reach out to PCP if there is no improvement in sx.  ?[facility] f/u appt confirmed? No   ?Are transportation arrangements needed? No  ?If their condition worsens, is the pt aware to call PCP or go to the Emergency Dept.? Yes ?Was the patient provided with contact information for the PCP's office or ED? Yes ?Was to pt encouraged to call back with questions or concerns? Yes ? ?

## 2022-02-06 ENCOUNTER — Encounter (HOSPITAL_COMMUNITY): Payer: Self-pay

## 2022-02-06 ENCOUNTER — Other Ambulatory Visit: Payer: Self-pay

## 2022-02-06 ENCOUNTER — Emergency Department (HOSPITAL_COMMUNITY)
Admission: EM | Admit: 2022-02-06 | Discharge: 2022-02-06 | Disposition: A | Discharge status: Home / Self Care | Payer: Medicaid Other | Attending: Emergency Medicine | Admitting: Emergency Medicine

## 2022-02-06 DIAGNOSIS — T7840XA Allergy, unspecified, initial encounter: Secondary | ICD-10-CM | POA: Insufficient documentation

## 2022-02-06 MED ORDER — PREDNISONE 20 MG PO TABS
40.0000 mg | ORAL_TABLET | Freq: Once | ORAL | Status: AC
  Administered 2022-02-06: 40 mg via ORAL
  Filled 2022-02-06: qty 2

## 2022-02-06 NOTE — ED Triage Notes (Signed)
Reports feels as though she is having an allergic reaction to chlorox.  Reports she is itching and burning all over.  ?

## 2022-02-06 NOTE — ED Provider Notes (Signed)
?[facility] ?Provider Note ? ? ?CSN: [id number] ?Arrival date & time: 02/06/22  1848 ? ?  ? ?History ? ?Chief Complaint  ?Patient presents with  ? Allergic Reaction  ? ? ?Lacey Hartman is a 26 y.o. female.  She is here with a complaint of possible allergic reaction.  She said she was in a kitchen at her friend's house who was mopping the floor and she began feeling some itching of her upper chest and some shortness of breath.  Burning sensation to upper chest.  No difficulty speaking or swallowing.  No nausea or vomiting or diarrhea.  She tried a Benadryl 1 tablet about 15 minutes ago. ? ?The history is provided by the patient.  ?Allergic Reaction ?Presenting symptoms: difficulty breathing and itching   ?Presenting symptoms: no rash   ?Severity:  Moderate ?Duration:  30 minutes ?Context: chemicals   ?Relieved by:  Nothing ?Worsened by:  Nothing ?Ineffective treatments:  Antihistamines ? ?  ? ?Home Medications ?Prior to Admission medications   ?Medication Sig Start Date End Date Taking? Authorizing Provider  ?albuterol (VENTOLIN HFA) 108 (90 Base) MCG/ACT inhaler Inhale 1-2 puffs into the lungs every 4 (four) hours as needed for wheezing or shortness of breath. 11/20/20   Noreene Larsson, NP  ?Cholecalciferol (VITAMIN D3) 25 MCG (1000 UT) CAPS Take 1 capsule (1,000 Units total) by mouth daily. 12/09/21   Renee Rival, FNP  ?FLUoxetine (PROZAC) 40 MG capsule Take 1 capsule (40 mg total) by mouth daily. 12/03/21   Renee Rival, FNP  ?fluticasone (FLONASE) 50 MCG/ACT nasal spray Use 2 sprays in each nostril BID for a week. After 1 week, decrease to 1 spray in each nostril BID as needed for congestion/allergies. 10/05/21   Noreene Larsson, NP  ?lubiprostone (AMITIZA) 8 MCG capsule One tablet once to twice daily with food for constipation. NEED OFFICE VISIT FOR MORE REFILLS 11/27/21   Mahala Menghini, PA-C  ?meclizine (ANTIVERT) 25 MG tablet Take 1 tablet (25 mg total) by mouth 2 (two) times  daily as needed for dizziness. 12/03/21   Renee Rival, FNP  ?methylphenidate (CONCERTA) 36 MG PO CR tablet Take 1 tablet (36 mg total) by mouth daily. 01/07/22 01/07/23  Cloria Spring, MD  ?omeprazole (PRILOSEC) 20 MG capsule Take one tablet daily in the evening. 12/03/21   Renee Rival, FNP  ?rizatriptan (MAXALT) 5 MG tablet Take 1 tablet (5 mg total) by mouth as needed for migraine. May repeat in 2 hours if needed 12/03/21   Renee Rival, FNP  ?Saccharomyces boulardii (PROBIOTIC) 250 MG CAPS Take 1 capsule by mouth at bedtime. 12/03/21   Renee Rival, FNP  ?traZODone (DESYREL) 50 MG tablet Take 1 tablet (50 mg total) by mouth at bedtime. 11/30/21   Cloria Spring, MD  ?   ? ?Allergies    ?Codeine   ? ?Review of Systems   ?Review of Systems  ?Constitutional:  Negative for fever.  ?HENT:  Negative for sore throat.   ?Eyes:  Negative for visual disturbance.  ?Respiratory:  Positive for chest tightness and shortness of breath.   ?Skin:  Positive for itching. Negative for rash.  ?Neurological:  Negative for headaches.  ? ?Physical Exam ?Updated Vital Signs ?BP (!) 125/94 (BP Location: Right Arm)   Pulse 80   Temp 97.7 ?F (36.5 ?C) (Oral)   Resp 20   Ht 5' (1.524 m)   Wt 86.6 kg   SpO2 100%  BMI 37.30 kg/m?  ?Physical Exam ?Vitals and nursing note reviewed.  ?Constitutional:   ?   General: She is not in acute distress. ?   Appearance: Normal appearance. She is well-developed.  ?HENT:  ?   Head: Normocephalic and atraumatic.  ?   Mouth/Throat:  ?   Mouth: Mucous membranes are moist.  ?   Pharynx: Oropharynx is clear.  ?Eyes:  ?   Conjunctiva/sclera: Conjunctivae normal.  ?Cardiovascular:  ?   Rate and Rhythm: Normal rate and regular rhythm.  ?   Heart sounds: No murmur heard. ?Pulmonary:  ?   Effort: Pulmonary effort is normal. No respiratory distress.  ?   Breath sounds: Normal breath sounds.  ?Abdominal:  ?   Palpations: Abdomen is soft.  ?   Tenderness: There is no abdominal  tenderness. There is no guarding or rebound.  ?Musculoskeletal:     ?   General: No swelling.  ?   Cervical back: Neck supple.  ?Skin: ?   General: Skin is warm and dry.  ?   Capillary Refill: Capillary refill takes less than 2 seconds.  ?Neurological:  ?   General: No focal deficit present.  ?   Mental Status: She is alert.  ? ? ?ED Results / Procedures / Treatments   ?Labs ?(all labs ordered are listed, but only abnormal results are displayed) ?Labs Reviewed - No data to display ? ?EKG ?None ? ?Radiology ?No results found. ? ?Procedures ?Procedures  ? ? ?Medications Ordered in ED ?Medications - No data to display ? ?ED Course/ Medical Decision Making/ A&P ?Clinical Course as of 02/07/22 1020  ?Sat Feb 06, 2022  ?2021 Assessed patient, she said she is feeling much better now.  She is comfortable plan for discharge. [MB]  ?  ?Clinical Course User Index ?[MB] Hayden Rasmussen, MD  ? ?                        ?Medical Decision Making ?Risk ?Prescription drug management. ? ?26 year old female here with respiratory and skin symptoms after exposure to Clorox.  She denies any skin exposure.  She is well-appearing satting 100% on room air.  She had self treated with Benadryl and I gave her a dose of prednisone.  She was observed for period of time with improvement in her symptoms.  I do not feel she needs further course of steroids.  Recommended symptomatic treatment with Benadryl as needed.  Return instructions discussed ? ? ? ? ? ? ? ? ?Final Clinical Impression(s) / ED Diagnoses ?Final diagnoses:  ?Allergic reaction, initial encounter  ? ? ?Rx / DC Orders ?ED Discharge Orders   ? ? None  ? ?  ? ? ?  ?Hayden Rasmussen, MD ?02/07/22 1022 ? ?

## 2022-02-06 NOTE — Discharge Instructions (Signed)
You are seen in the emergency department for possible allergic reaction.  You received a dose of steroids and you had already taken some Benadryl and your symptoms were improved.  You can continue Benadryl 1 or 2 tablets every 6 hours as needed.  Stay well-hydrated.  Avoid exposure to Clorox again.  Return if any worsening or concerning symptoms ?

## 2022-02-08 ENCOUNTER — Telehealth: Payer: Self-pay

## 2022-02-08 NOTE — Telephone Encounter (Signed)
Transition Care Management Unsuccessful Follow-up Telephone Call ? ?Date of discharge and from where:  02/06/2022 from [facility] ? ?Attempts:  1st Attempt ? ?Reason for unsuccessful TCM follow-up call:  Left voice message ? ? ? ?

## 2022-02-09 NOTE — Telephone Encounter (Signed)
Transition Care Management Unsuccessful Follow-up Telephone Call ? ?Date of discharge and from where:  02/06/2022-  ? ?Attempts:  2nd Attempt ? ?Reason for unsuccessful TCM follow-up call:  Left voice message ? ?  ?

## 2022-02-10 NOTE — Telephone Encounter (Signed)
Transition Care Management Unsuccessful Follow-up Telephone Call ? ?Date of discharge and from where:  02/06/2022-[facility]  ? ?Attempts:  3rd Attempt ? ?Reason for unsuccessful TCM follow-up call:  Left voice message ? ?  ?

## 2022-02-11 DIAGNOSIS — N643 Galactorrhea not associated with childbirth: Secondary | ICD-10-CM | POA: Diagnosis not present

## 2022-02-19 DIAGNOSIS — N643 Galactorrhea not associated with childbirth: Secondary | ICD-10-CM | POA: Diagnosis not present

## 2022-03-19 ENCOUNTER — Encounter (HOSPITAL_COMMUNITY): Payer: Self-pay | Admitting: Psychiatry

## 2022-03-19 ENCOUNTER — Telehealth (INDEPENDENT_AMBULATORY_CARE_PROVIDER_SITE_OTHER): Payer: Medicaid Other | Admitting: Psychiatry

## 2022-03-19 DIAGNOSIS — F331 Major depressive disorder, recurrent, moderate: Secondary | ICD-10-CM

## 2022-03-19 DIAGNOSIS — F900 Attention-deficit hyperactivity disorder, predominantly inattentive type: Secondary | ICD-10-CM | POA: Diagnosis not present

## 2022-03-19 MED ORDER — METHYLPHENIDATE HCL ER (OSM) 36 MG PO TBCR: 36.0000 mg | EXTENDED_RELEASE_TABLET | Freq: Every day | ORAL | 0 refills | Status: DC

## 2022-03-19 MED ORDER — TRAZODONE HCL 50 MG PO TABS: 50.0000 mg | ORAL_TABLET | Freq: Every day | ORAL | 2 refills | Status: DC

## 2022-03-19 MED ORDER — FLUOXETINE HCL 40 MG PO CAPS
40.0000 mg | ORAL_CAPSULE | Freq: Every day | ORAL | 2 refills | Status: DC
Start: 2022-03-19 — End: 2022-11-01

## 2022-03-19 NOTE — Progress Notes (Signed)
Virtual Visit via Telephone Note  I connected with Lacey Hartman on 03/19/22 at  9:20 AM EDT by telephone and verified that I am speaking with the correct person using two identifiers.  Location: Patient: home Provider: home office   I discussed the limitations, risks, security and privacy concerns of performing an evaluation and management service by telephone and the availability of in person appointments. I also discussed with the patient that there may be a patient responsible charge related to this service. The patient expressed understanding and agreed to proceed.      I discussed the assessment and treatment plan with the patient. The patient was provided an opportunity to ask questions and all were answered. The patient agreed with the plan and demonstrated an understanding of the instructions.   The patient was advised to call back or seek an in-person evaluation if the symptoms worsen or if the condition fails to improve as anticipated.  I provided 15 minutes of non-face-to-face time during this encounter.   Levonne Spiller, MD  [facility] MD/PA/NP OP Progress Note  03/19/2022 9:43 AM Lacey Hartman  MRN:  [id number]  Chief Complaint:  Chief Complaint  Patient presents with   Depression   Anxiety   ADHD   HPI: This patient is a 26 year old single white female lives with her boyfriend 2 daughters  and one son  in [place]. 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  The patient returns for follow-up after about 4 months.  She states that she has run out of her medications last several days.  Since then she is felt a little bit more down and anxious and unable to sleep.  However when she is on the Prozac and trazodone she does well.  She did have an episode of galactorrhea but it has resolved.  Her gynecologist thought it was secondary to medication but she has never had this before.  Apparently she had a normal breast ultrasound.  Her prolactin was normal.  The patient's  controlled substance report indicates she has not picked up with the Concerta since November.  We will need to check with her in the pharmacy regarding this. Visit Diagnosis:    ICD-10-CM   1. Moderate episode of recurrent major depressive disorder (HCC)  F33.1     2. 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 Three primary cancers   Cancer Paternal Grandmother        breast   Angioedema Neg Hx    Atopy Neg Hx    Eczema Neg Hx    Immunodeficiency Neg Hx    Urticaria Neg Hx     Social History:  Social History   Socioeconomic History   Marital status: Significant Other    Spouse name: [name]   Number of children: 3   Years of education: Not on file   Highest education level: 9th grade  Occupational History   Not on file  Tobacco Use   Smoking status: Former    Packs/day: 0.33     Years: 7.00    Pack years: 2.31    Types: Cigarettes    Quit date: 2018    Years since quitting: 5.4    Passive exposure: Current   Smokeless tobacco: Never  Vaping Use   Vaping Use: Never used  Substance and Sexual Activity   Alcohol use: No    Alcohol/week: 0.0 standard drinks   Drug use: No   Sexual activity: Yes    Birth control/protection: Pill  Other Topics Concern   Not on file  Social History Narrative   Lives with [name] and 3 children   [name]   [name]      Enjoys: playing with kids, going outside, beach, crafts      Diet: does not eat a lot of veggies or fruits, a lot of junk    Caffeine: 15 sodas   Water: 4-5 cups daily      Wears seat belt    Smoke detectors   Does not use phone while driving    Social Determinants of Health   Financial Resource Strain: Not on file  Food Insecurity: Not on file  Transportation Needs: Not on file  Physical Activity: Not on file  Stress: Not on file  Social Connections: Not on file    Allergies:  Allergies  Allergen Reactions   Codeine Hives, Swelling and Rash    & hallucinations/    Metabolic Disorder Labs: Lab Results  Component Value Date   HGBA1C 5.1 12/08/2021   No results found for: PROLACTIN Lab Results  Component Value Date   CHOL 141 12/08/2021   TRIG 86 12/08/2021   HDL 41 12/08/2021   CHOLHDL 3.4 12/08/2021   LDLCALC 83 12/08/2021   LDLCALC 89 07/24/2019   Lab Results  Component Value Date   TSH 1.400 12/08/2021   TSH 1.780 07/29/2020    Therapeutic Level Labs: No results found for: LITHIUM No results found for: VALPROATE No components found for:  CBMZ  Current Medications: Current Outpatient Medications  Medication Sig Dispense Refill   methylphenidate (CONCERTA) 36 MG PO CR tablet Take 1 tablet (36 mg total) by mouth daily. 30 tablet 0   methylphenidate (CONCERTA) 36 MG PO CR tablet Take 1 tablet (36 mg total) by mouth daily. 30 tablet 0   albuterol (VENTOLIN HFA) 108  (90 Base) MCG/ACT inhaler Inhale 1-2 puffs into the lungs every 4 (four) hours as needed for wheezing or shortness of breath. 18 g 1   Cholecalciferol (VITAMIN  D3) 25 MCG (1000 UT) CAPS Take 1 capsule (1,000 Units total) by mouth daily. 60 capsule 3   diphenhydrAMINE HCl (BENADRYL ALLERGY PO) Take 1 tablet by mouth daily as needed (allergic reaction).     FLUoxetine (PROZAC) 40 MG capsule Take 1 capsule (40 mg total) by mouth daily. 30 capsule 2   fluticasone (FLONASE) 50 MCG/ACT nasal spray Use 2 sprays in each nostril BID for a week. After 1 week, decrease to 1 spray in each nostril BID as needed for congestion/allergies. (Patient not taking: Reported on 02/06/2022) 16 g 6   lubiprostone (AMITIZA) 8 MCG capsule One tablet once to twice daily with food for constipation. NEED OFFICE VISIT FOR MORE REFILLS 60 capsule 3   meclizine (ANTIVERT) 25 MG tablet Take 1 tablet (25 mg total) by mouth 2 (two) times daily as needed for dizziness. (Patient not taking: Reported on 02/06/2022) 14 tablet 0   methylphenidate (CONCERTA) 36 MG PO CR tablet Take 1 tablet (36 mg total) by mouth daily. 30 tablet 0   norgestimate-ethinyl estradiol (ORTHO-CYCLEN) 0.25-35 MG-MCG tablet Take 1 tablet by mouth daily.     omeprazole (PRILOSEC) 20 MG capsule Take one tablet daily in the evening. 90 capsule 1   rizatriptan (MAXALT) 5 MG tablet Take 1 tablet (5 mg total) by mouth as needed for migraine. May repeat in 2 hours if needed 10 tablet 0   Saccharomyces boulardii (PROBIOTIC) 250 MG CAPS Take 1 capsule by mouth at bedtime. 30 capsule 1   traZODone (DESYREL) 50 MG tablet Take 1 tablet (50 mg total) by mouth at bedtime. 30 tablet 2   No current facility-administered medications for this visit.     Musculoskeletal: Hartman & Muscle Tone: na Gait & Station: na Patient leans: na  Psychiatric Specialty Exam: Review of Systems  All other systems reviewed and are negative.  There were no vitals taken for this visit.There  is no height or weight on file to calculate BMI.  General Appearance: NA  Eye Contact:  NA  Speech:  Clear and Coherent  Volume:  Normal  Mood:  Euthymic  Affect:  NA  Thought Process:  Goal Directed  Orientation:  Full (Time, Place, and Person)  Thought Content: WDL   Suicidal Thoughts:  No  Homicidal Thoughts:  No  Memory:  Immediate;   Good Recent;   Fair Remote;   Fair  Judgement:  Fair  Insight:  Shallow  Psychomotor Activity:  Normal  Concentration:  Concentration: Fair and Attention Span: Fair  Recall:  [employer] of Knowledge: Fair  Language: Good  Akathisia:  No  Handed:  Right  AIMS (if indicated): not done  Assets:  Communication Skills Desire for Improvement Physical Health Resilience Social Support  ADL's:  Intact  Cognition: Impaired,  Mild  Sleep:  Good   Screenings: GAD-7    Flowsheet Row Video Visit from 03/11/2020 in [facility] Office Visit from 12/20/2019 in [facility] Office Visit from 11/22/2019 in [facility]  Total GAD-7 Score '5 18 14      '$ PHQ2-9    Flowsheet Row Video Visit from 03/19/2022 in [facility] Office Visit from 12/03/2021 in [facility] Video Visit from 11/30/2021 in [facility] Office Visit from 10/05/2021 in [facility] Office Visit from 05/20/2021 in [facility]  PHQ-2 Total Score 0 0 1 0 0      Flowsheet Row Video Visit from 03/19/2022 in [facility] from 02/06/2022 in [facility] from 01/29/2022 in [facility] No Risk No Risk No Risk        Assessment and Plan: This patient is a 26 year old female with a history of mild cognitive impairment hearing loss depression and ADD.  Is unclear whether or not she has been compliant with all the medications particularly the  Concerta.  We will need to check this with her pharmacy.  We will restart Concerta 36 mg every morning for ADD, continue Prozac 40 mg daily for depression and trazodone 50 mg at bedtime for sleep.  She will return to see me in 3 months  Collaboration of Care:  Primary Care Provider AEB chart notes are shared with PCP on the epic system  Patient/Guardian was advised Release of Information must be obtained prior to any record release in order to collaborate their care with an outside provider. Patient/Guardian was advised if they have not already done so to contact the registration department to sign all necessary forms in order for [place] to release information regarding their care.   Consent: Patient/Guardian gives verbal consent for treatment and assignment of benefits for services provided during this visit. Patient/Guardian expressed understanding and agreed to proceed.    Levonne Spiller, MD 03/19/2022, 9:43 AM

## 2022-04-28 ENCOUNTER — Emergency Department (HOSPITAL_COMMUNITY)
Admission: EM | Admit: 2022-04-28 | Discharge: 2022-04-28 | Disposition: A | Discharge status: Home / Self Care | Payer: Medicaid Other | Attending: Emergency Medicine | Admitting: Emergency Medicine

## 2022-04-28 ENCOUNTER — Encounter (HOSPITAL_COMMUNITY): Payer: Self-pay | Admitting: *Deleted

## 2022-04-28 ENCOUNTER — Other Ambulatory Visit: Payer: Self-pay

## 2022-04-28 DIAGNOSIS — Z7951 Long term (current) use of inhaled steroids: Secondary | ICD-10-CM | POA: Diagnosis not present

## 2022-04-28 DIAGNOSIS — H9201 Otalgia, right ear: Secondary | ICD-10-CM | POA: Diagnosis present

## 2022-04-28 DIAGNOSIS — J45909 Unspecified asthma, uncomplicated: Secondary | ICD-10-CM | POA: Diagnosis not present

## 2022-04-28 DIAGNOSIS — H60331 Swimmer's ear, right ear: Secondary | ICD-10-CM | POA: Insufficient documentation

## 2022-04-28 MED ORDER — NEOMYCIN-POLYMYXIN-HC 3.5-10000-1 OT SUSP: 3.0000 [drp] | Freq: Three times a day (TID) | OTIC | 0 refills | Status: DC

## 2022-04-28 NOTE — ED Triage Notes (Signed)
Pt in c/o R ear pain onset x 2 days, denies drainage, reports swimming recently, denies fever, A&O x4

## 2022-04-28 NOTE — ED Provider Notes (Signed)
[facility] Provider Note   CSN: [id number] Arrival date & time: 04/28/22  1133     History  Chief Complaint  Patient presents with   Otalgia    Lacey Hartman is a 26 y.o. female with medical history of speech impediment, hard of hearing, depression, asthma, ADHD.  Patient presents to the ED for evaluation of right-sided ear pain.  Patient reports that she has recently been on vacation at [employer] every day.  The patient states for the last 3 days she has had progressively worsening right-sided ear pain described as a throb.  Patient states that she is able to relieve symptoms at home with ibuprofen however pain always returns.  Patient reports history of being hard of hearing, states that she wears a hearing aid in her right ear which she has been unable to wear due to pain.  Patient reports that she has a an appointment with her PCP this Friday however due to pain she was unable to make it to this appointment.  Patient denies any fevers, nausea, vomiting, body aches or chills, ear drainage.   Otalgia Associated symptoms: no ear discharge, no fever and no vomiting        Home Medications Prior to Admission medications   Medication Sig Start Date End Date Taking? Authorizing Provider  neomycin-polymyxin-hydrocortisone (CORTISPORIN) 3.5-10000-1 OTIC suspension Place 3 drops into the right ear 3 (three) times daily. Please place 3 drops into right ear 3 times daily for 7 days. 04/28/22  Yes Azucena Cecil, PA-C  albuterol (VENTOLIN HFA) 108 (90 Base) MCG/ACT inhaler Inhale 1-2 puffs into the lungs every 4 (four) hours as needed for wheezing or shortness of breath. 11/20/20   Noreene Larsson, NP  Cholecalciferol (VITAMIN D3) 25 MCG (1000 UT) CAPS Take 1 capsule (1,000 Units total) by mouth daily. 12/09/21   Renee Rival, FNP  diphenhydrAMINE HCl (BENADRYL ALLERGY PO) Take 1 tablet by mouth daily as needed (allergic reaction).    [provider]  FLUoxetine (PROZAC) 40 MG capsule Take 1 capsule (40 mg total) by mouth daily. 03/19/22   Cloria Spring, MD  fluticasone (FLONASE) 50 MCG/ACT nasal spray Use 2 sprays in each nostril BID for a week. After 1 week, decrease to 1 spray in each nostril BID as needed for congestion/allergies. Patient not taking: Reported on 02/06/2022 10/05/21   Noreene Larsson, NP  lubiprostone (AMITIZA) 8 MCG capsule One tablet once to twice daily with food for constipation. NEED OFFICE VISIT FOR MORE REFILLS 11/27/21   Mahala Menghini, PA-C  meclizine (ANTIVERT) 25 MG tablet Take 1 tablet (25 mg total) by mouth 2 (two) times daily as needed for dizziness. Patient not taking: Reported on 02/06/2022 12/03/21   Renee Rival, FNP  methylphenidate (CONCERTA) 36 MG PO CR tablet Take 1 tablet (36 mg total) by mouth daily. 03/19/22 03/19/23  Cloria Spring, MD  methylphenidate (CONCERTA) 36 MG PO CR tablet Take 1 tablet (36 mg total) by mouth daily. 03/19/22 03/19/23  Cloria Spring, MD  methylphenidate (CONCERTA) 36 MG PO CR tablet Take 1 tablet (36 mg total) by mouth daily. 03/19/22 03/19/23  Cloria Spring, MD  norgestimate-ethinyl estradiol (ORTHO-CYCLEN) 0.25-35 MG-MCG tablet Take 1 tablet by mouth daily. 02/03/22 02/03/23  [provider]  omeprazole (PRILOSEC) 20 MG capsule Take one tablet daily in the evening. 12/03/21   Renee Rival, FNP  rizatriptan (MAXALT) 5 MG tablet Take 1 tablet (5  mg total) by mouth as needed for migraine. May repeat in 2 hours if needed 12/03/21   Renee Rival, FNP  Saccharomyces boulardii (PROBIOTIC) 250 MG CAPS Take 1 capsule by mouth at bedtime. 12/03/21   Renee Rival, FNP  traZODone (DESYREL) 50 MG tablet Take 1 tablet (50 mg total) by mouth at bedtime. 03/19/22   Cloria Spring, MD      Allergies    Codeine    Review of Systems   Review of Systems  Constitutional:  Negative for chills and fever.  HENT:  Positive for ear pain. Negative for ear  discharge.   Gastrointestinal:  Negative for nausea and vomiting.  All other systems reviewed and are negative.   Physical Exam Updated Vital Signs BP 120/78 (BP Location: Right Arm)   Pulse 68   Temp (!) 97.3 F (36.3 C) (Oral)   Resp 18   LMP 04/26/2022 (Exact Date)   SpO2 100%  Physical Exam Vitals and nursing note reviewed.  Constitutional:      General: She is not in acute distress.    Appearance: Normal appearance. She is not ill-appearing, toxic-appearing or diaphoretic.  HENT:     Head: Normocephalic and atraumatic.     Right Ear: Decreased hearing noted. Tenderness present. No drainage. No middle ear effusion. There is no impacted cerumen. No foreign body.     Left Ear: Decreased hearing noted. No drainage.     Ears:     Comments: Patient with erythematous external auditory canal.  Tympanic membrane is intact.    Nose: Nose normal. No congestion.     Mouth/Throat:     Mouth: Mucous membranes are moist.     Pharynx: Oropharynx is clear.  Eyes:     Extraocular Movements: Extraocular movements intact.     Conjunctiva/sclera: Conjunctivae normal.     Pupils: Pupils are equal, round, and reactive to light.  Cardiovascular:     Rate and Rhythm: Normal rate and regular rhythm.  Pulmonary:     Effort: Pulmonary effort is normal.     Breath sounds: Normal breath sounds. No wheezing.  Abdominal:     General: Abdomen is flat. Bowel sounds are normal.     Palpations: Abdomen is soft.     Tenderness: There is no abdominal tenderness.  Musculoskeletal:     Cervical back: Normal range of motion and neck supple. No tenderness.  Skin:    General: Skin is warm and dry.     Capillary Refill: Capillary refill takes less than 2 seconds.  Neurological:     Mental Status: She is alert and oriented to person, place, and time.     ED Results / Procedures / Treatments   Labs (all labs ordered are listed, but only abnormal results are displayed) Labs Reviewed - No data to  display  EKG None  Radiology No results found.  Procedures Procedures   Medications Ordered in ED Medications - No data to display  ED Course/ Medical Decision Making/ A&P                           Medical Decision Making  26 year old female presents to the ED for evaluation.  Please see HPI for further details.  On examination, the patient is afebrile, nontachycardic.  The patient lung sounds are clear bilaterally.  The patient is nontoxic in appearance.  The patient's right tympanic membrane is intact.  There is no appreciable drainage coming from  the ear.  There is erythema throughout the external auditory canal.  This is consistent with otitis externa, the patient states that she has recent history of swimming.  The patient will be placed on polymyxin otic drops 3 times daily for the next 7 days.  The patient has been advised to follow-up with her PCP on Friday as previously planned for further management, follow-up and to ensure that her condition is improving.  The patient voices understanding of these instructions.  The patient was given return precautions to include fevers, nausea, vomiting, ear drainage and she has voiced understanding of these.  The patient has had all her questions answered to her satisfaction.  The patient is stable at this time for discharge home   Final Clinical Impression(s) / ED Diagnoses Final diagnoses:  Acute swimmer's ear of right side    Rx / DC Orders ED Discharge Orders          Ordered    neomycin-polymyxin-hydrocortisone (CORTISPORIN) 3.5-10000-1 OTIC suspension  3 times daily        04/28/22 1321              Lawana Chambers 04/28/22 1324    Godfrey Pick, MD 04/30/22 1210

## 2022-04-28 NOTE — ED Notes (Signed)
ED Provider at bedside. 

## 2022-04-28 NOTE — Discharge Instructions (Addendum)
Please return to the ED with any new symptom such as fevers, nausea or vomiting, ear drainage Please follow-up with your PCP on Friday as we previously discussed unless your clinical condition improves Please read the attached guide concerning otitis externa or swimmer's ear Please utilize eardrops provided.  You will use these 3 times daily in the right ear for the next 7 days. Please continue taking ibuprofen at home for relief of pain.  If you begin experiencing dark stool, blood in your stool, abdominal pain please stop using ibuprofen.

## 2022-05-11 ENCOUNTER — Ambulatory Visit: Payer: Medicaid Other | Admitting: Nurse Practitioner

## 2022-05-11 ENCOUNTER — Encounter: Payer: Self-pay | Admitting: Nurse Practitioner

## 2022-05-11 DIAGNOSIS — M545 Low back pain, unspecified: Secondary | ICD-10-CM

## 2022-05-11 DIAGNOSIS — R399 Unspecified symptoms and signs involving the genitourinary system: Secondary | ICD-10-CM | POA: Diagnosis not present

## 2022-05-11 DIAGNOSIS — R350 Frequency of micturition: Secondary | ICD-10-CM | POA: Diagnosis not present

## 2022-05-11 LAB — POCT URINALYSIS DIP (CLINITEK)
Bilirubin, UA: NEGATIVE
Glucose, UA: NEGATIVE mg/dL
Ketones, POC UA: NEGATIVE mg/dL
Nitrite, UA: NEGATIVE
POC PROTEIN,UA: NEGATIVE
Spec Grav, UA: 1.02 (ref 1.010–1.025)
Urobilinogen, UA: 0.2 E.U./dL
pH, UA: 8.5 — AB (ref 5.0–8.0)

## 2022-05-11 NOTE — Assessment & Plan Note (Signed)
Pain could be musculoskeletal injury  Take OTC ibuprofen as needed Urine sent to the lab for culture to rule out UTI

## 2022-05-11 NOTE — Progress Notes (Signed)
Virtual Visit via Telephone Note  I connected with Lacey Hartman @ on 05/11/22 at 1252pmby telephone and verified that I am speaking with the correct person using two identifiers.  I spent 7 minutes on this telephone encounter  Location: Patient: home Provider: office   I discussed the limitations, risks, security and privacy concerns of performing an evaluation and management service by telephone and the availability of in person appointments. I also discussed with the patient that there may be a patient responsible charge related to this service. The patient expressed understanding and agreed to proceed.   History of Present Illness: [name] with past medical history of anxiety, ADD, IBS, GERD c/o constant right sided low back pain, urinary frequency since the past 3 days, 4 days ago She lifted something heavy felt something move in her back, has sharp pain rated 8/10. Marland Kitchen She has been taking  ibuprofen and it helped some. Last menstrual period was July 6th.  Patient denies fever, chills, urgency, dysuria, abdominal discomfort   Observations/Objective:   Assessment and Plan: UTI symptoms UA showed trace leucocytes, Trace blood   urine sent to the lab for culture Patient told to drink plenty of water to maintain hydration.  Denies dysuria, hesitancy fever, chills, malaise Continue OTC  ibuprofen as needed for pain and fever   Low back pain Pain could be musculoskeletal injury  Take OTC ibuprofen as needed Urine sent to the lab for culture to rule out UTI   Follow Up Instructions:    I discussed the assessment and treatment plan with the patient. The patient was provided an opportunity to ask questions and all were answered. The patient agreed with the plan and demonstrated an understanding of the instructions.   The patient was advised to call back or seek an in-person evaluation if the symptoms worsen or if the condition fails to improve as anticipated.

## 2022-05-11 NOTE — Assessment & Plan Note (Signed)
UA showed trace leucocytes, Trace blood   urine sent to the lab for culture Patient told to drink plenty of water to maintain hydration.  Denies dysuria, hesitancy fever, chills, malaise Continue OTC  ibuprofen as needed for pain and fever

## 2022-05-14 LAB — URINE CULTURE

## 2022-05-14 NOTE — Progress Notes (Signed)
No UTI

## 2022-06-04 ENCOUNTER — Ambulatory Visit: Payer: Medicaid Other | Admitting: Nurse Practitioner

## 2022-06-04 ENCOUNTER — Encounter: Payer: Self-pay | Admitting: Nurse Practitioner

## 2022-06-08 ENCOUNTER — Ambulatory Visit: Payer: Medicaid Other | Admitting: Nurse Practitioner

## 2022-06-17 ENCOUNTER — Encounter: Payer: Self-pay | Admitting: Internal Medicine

## 2022-06-17 ENCOUNTER — Ambulatory Visit (INDEPENDENT_AMBULATORY_CARE_PROVIDER_SITE_OTHER): Payer: Medicaid Other | Admitting: Internal Medicine

## 2022-06-17 VITALS — BP 118/78 | HR 84 | Ht 60.0 in | Wt 182.2 lb

## 2022-06-17 DIAGNOSIS — Z0000 Encounter for general adult medical examination without abnormal findings: Secondary | ICD-10-CM

## 2022-06-17 DIAGNOSIS — F331 Major depressive disorder, recurrent, moderate: Secondary | ICD-10-CM | POA: Diagnosis not present

## 2022-06-17 DIAGNOSIS — E669 Obesity, unspecified: Secondary | ICD-10-CM | POA: Diagnosis not present

## 2022-06-17 DIAGNOSIS — J45909 Unspecified asthma, uncomplicated: Secondary | ICD-10-CM | POA: Diagnosis not present

## 2022-06-17 DIAGNOSIS — F900 Attention-deficit hyperactivity disorder, predominantly inattentive type: Secondary | ICD-10-CM

## 2022-06-17 DIAGNOSIS — K581 Irritable bowel syndrome with constipation: Secondary | ICD-10-CM

## 2022-06-17 DIAGNOSIS — Z789 Other specified health status: Secondary | ICD-10-CM

## 2022-06-17 MED ORDER — NORGESTIMATE-ETH ESTRADIOL 0.25-35 MG-MCG PO TABS: 1.0000 | ORAL_TABLET | Freq: Every day | ORAL | 3 refills | Status: DC

## 2022-06-17 MED ORDER — FLUTICASONE PROPIONATE 50 MCG/ACT NA SUSP: NASAL | 6 refills | Status: DC

## 2022-06-17 MED ORDER — ALBUTEROL SULFATE HFA 108 (90 BASE) MCG/ACT IN AERS: 1.0000 | INHALATION_SPRAY | RESPIRATORY_TRACT | 1 refills | Status: DC | PRN

## 2022-06-17 MED ORDER — LUBIPROSTONE 8 MCG PO CAPS: ORAL_CAPSULE | ORAL | 3 refills | Status: DC

## 2022-06-17 NOTE — Progress Notes (Signed)
Established Patient Office Visit  Subjective   Patient ID: Lacey Hartman, female    DOB: [birth year]  Age: 26 y.o. MRN: [id number]  Chief Complaint  Patient presents with   Follow-up   Ms. Lacey Hartman is a 26 year old woman returning to care today.  She is followed by [name], NP.  Last seen in late July for an acute visit.  She has a past medical history significant for anxiety, ADD, IBS-C, asthma, and GERD.  Ms. Lacey Hartman is feeling well today.  Her acute concerns today include requesting medication refills and wondering when she can resume normal activity at work.  She was previously instructed to adhere to light duty due to low back pain.  Her back pain has resolved and she reports feeling well.  Acute concerns and chronic medical conditions discussed today are individually addressed in A/P below.  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   Past Surgical History:  Procedure Laterality Date   NO PAST SURGERIES     Social History   Tobacco Use   Smoking status: Former    Packs/day: 0.33    Years: 7.00    Total pack years: 2.31    Types: Cigarettes    Quit date: 2018    Years since quitting: 5.6    Passive exposure: Current   Smokeless tobacco: Never  Vaping Use   Vaping Use: Never used  Substance Use Topics   Alcohol use: No    Alcohol/week: 0.0 standard drinks of alcohol   Drug use: No   Family History  Problem Relation Age of Onset   Depression Mother    Anxiety disorder Mother    Bipolar disorder Mother    Asthma Mother    Parkinson's disease Father    ADD /  ADHD Brother    Breast cancer Brother    ADD / ADHD Brother    Asthma Brother    Allergic rhinitis Brother    Cancer Maternal Uncle        throat   Cancer Maternal Grandmother        breast cancer, lung cancer X 2, colon cancer. Three primary cancers   Cancer Paternal Grandmother        breast   Angioedema Neg Hx    Atopy Neg Hx    Eczema Neg Hx    Immunodeficiency Neg Hx    Urticaria Neg Hx    Allergies  Allergen Reactions   Codeine Hives, Swelling and Rash    & hallucinations/   Review of Systems  Constitutional:  Negative for chills and fever.  HENT:  Positive for ear pain. Negative for sore throat.   Respiratory:  Negative for cough and shortness of breath.   Cardiovascular:  Negative for chest pain, palpitations and leg swelling.  Gastrointestinal:  Negative for abdominal pain, blood in stool, constipation, diarrhea, nausea and vomiting.  Genitourinary:  Negative for dysuria and hematuria.  Musculoskeletal:  Negative for myalgias.  Skin:  Negative for itching and rash.  Neurological:  Negative for dizziness and headaches.  Psychiatric/Behavioral:  Negative for depression and suicidal ideas.      Objective:     BP 118/78   Pulse 84   Ht 5' (1.524 m)   Wt 182 lb 3.2 oz (82.6 kg)   SpO2 98%   BMI 35.58 kg/m   Physical Exam Vitals reviewed.  Constitutional:      General: She is not in acute distress.    Appearance: Normal appearance. She is obese. She is not toxic-appearing.  HENT:     Head: Normocephalic and atraumatic.     Left Ear: Tympanic membrane, ear canal and external ear normal. There is no impacted cerumen.     Nose: Nose normal. No congestion or rhinorrhea.     Mouth/Throat:     Mouth: Mucous membranes are moist.     Pharynx: Oropharynx is clear. No oropharyngeal exudate or posterior oropharyngeal erythema.  Eyes:     General: No scleral icterus.    Extraocular Movements: Extraocular movements intact.     Conjunctiva/sclera: Conjunctivae  normal.     Pupils: Pupils are equal, round, and reactive to light.  Cardiovascular:     Rate and Rhythm: Normal rate and regular rhythm.     Pulses: Normal pulses.     Heart sounds: Normal heart sounds. No murmur heard.    No friction rub. No gallop.  Pulmonary:     Effort: Pulmonary effort is normal.     Breath sounds: Normal breath sounds. No wheezing, rhonchi or rales.  Abdominal:     General: Abdomen is flat. Bowel sounds are normal. There is no distension.     Palpations: Abdomen is soft. There is no mass.     Tenderness: There is no abdominal tenderness.  Musculoskeletal:        General: Normal range of motion.     Right lower leg: No edema.     Left lower leg: No edema.  Skin:    General: Skin is warm and dry.     Capillary Refill: Capillary refill takes less than 2 seconds.     Coloration: Skin is not jaundiced.  Neurological:     General: No focal deficit present.     Mental Status: She is alert and oriented to person, place, and time.     Motor: No weakness.  Psychiatric:        Mood and Affect: Mood normal.        Behavior: Behavior normal.    No results found for any visits on 06/17/22.  Last CBC Lab Results  Component Value Date   WBC 7.3 12/08/2021   HGB 12.9 12/08/2021   HCT 39.2 12/08/2021   MCV 92 12/08/2021   MCH 30.3 12/08/2021   RDW 12.5 12/08/2021   PLT 245 44/31/5400   Last metabolic panel Lab Results  Component Value Date   GLUCOSE 88 12/08/2021   NA 141 12/08/2021   K 4.1 12/08/2021   CL 105 12/08/2021   CO2 22 12/08/2021   BUN 9 12/08/2021   CREATININE 0.84 12/08/2021   EGFR 98 12/08/2021   CALCIUM 9.4 12/08/2021   PROT 6.9 12/08/2021   ALBUMIN 4.5 12/08/2021   LABGLOB 2.4 12/08/2021   AGRATIO 1.9 12/08/2021   BILITOT 0.4 12/08/2021   ALKPHOS 104 12/08/2021   AST 17 12/08/2021   ALT 24 12/08/2021   Last lipids Lab Results  Component Value  Date   CHOL 141 12/08/2021   HDL 41 12/08/2021   LDLCALC 83 12/08/2021   TRIG 86  12/08/2021   CHOLHDL 3.4 12/08/2021   Last hemoglobin A1c Lab Results  Component Value Date   HGBA1C 5.1 12/08/2021   Last thyroid functions Lab Results  Component Value Date   TSH 1.400 12/08/2021   T4TOTAL 9.7 07/29/2020   Last vitamin D Lab Results  Component Value Date   VD25OH 26.5 (L) 12/08/2021      Assessment & Plan:   Problem List Items Addressed This Visit       Respiratory   Mild asthma    Symptoms well controlled with albuterol as needed.  She has not had to use her inhaler recently. --Albuterol refilled today      Relevant Medications   albuterol (VENTOLIN HFA) 108 (90 Base) MCG/ACT inhaler   fluticasone (FLONASE) 50 MCG/ACT nasal spray     Digestive   IBS (irritable bowel syndrome) - Primary    IBS-C.  States that her symptoms are well controlled with Amitiza.  Refill provided today.      Relevant Medications   lubiprostone (AMITIZA) 8 MCG capsule     Other   ADD (attention deficit disorder)    Prescribed Concerta by Dr. Harrington Challenger (psychiatry).  She requests a refill today, however I have instructed her to contact Dr. Harrington Challenger since she has been managing this medication.      Depression    Mood remains stable on Prozac.  Denies SI/HI today.  She requests a refill of Prozac.  I have advised her to contact Dr. Harrington Challenger.      Obesity (BMI 30-39.9)    We discussed today that she is overweight and needs to lose weight.  She expressed understanding.  We specifically discussed the Mediterranean diet and the national recommendation for at least 150 total minutes of exercise in a week.      Preventative health care    -Declined influenza vaccination today -Due for Pap smear in October, states she has follow-up scheduled with OB/GYN next week.      Uses birth control    Currently taking Ortho-Cyclen for birth control.  Requests a refill today. -Ortho-Cyclen refilled      Relevant Medications   norgestimate-ethinyl estradiol (ORTHO-CYCLEN) 0.25-35 MG-MCG  tablet    Return in about 6 months (around 12/07/2022).    Johnette Abraham, MD

## 2022-06-17 NOTE — Assessment & Plan Note (Addendum)
Currently taking Ortho-Cyclen for birth control.  Requests a refill today. -Ortho-Cyclen refilled

## 2022-06-17 NOTE — Assessment & Plan Note (Signed)
IBS-C.  States that her symptoms are well controlled with Amitiza.  Refill provided today.

## 2022-06-17 NOTE — Assessment & Plan Note (Signed)
Symptoms well controlled with albuterol as needed.  She has not had to use her inhaler recently. --Albuterol refilled today

## 2022-06-17 NOTE — Assessment & Plan Note (Signed)
Mood remains stable on Prozac.  Denies SI/HI today.  She requests a refill of Prozac.  I have advised her to contact Dr. Harrington Challenger.

## 2022-06-17 NOTE — Assessment & Plan Note (Signed)
Prescribed Concerta by Dr. Harrington Challenger (psychiatry).  She requests a refill today, however I have instructed her to contact Dr. Harrington Challenger since she has been managing this medication.

## 2022-06-17 NOTE — Assessment & Plan Note (Signed)
We discussed today that she is overweight and needs to lose weight.  She expressed understanding.  We specifically discussed the Mediterranean diet and the national recommendation for at least 150 total minutes of exercise in a week.

## 2022-06-17 NOTE — Assessment & Plan Note (Signed)
-  Declined influenza vaccination today -Due for Pap smear in October, states she has follow-up scheduled with OB/GYN next week.

## 2022-06-17 NOTE — Patient Instructions (Signed)
It was a pleasure to see you today.  Thank you for giving [place] the opportunity to be involved in your care.  Below is a brief recap of your visit and next steps.  We will plan to see you again in 12/07/2022  Summary I have refilled your medications today  Next steps We will see you for follow up in February Please let us know if you need anything

## 2022-06-25 ENCOUNTER — Telehealth: Payer: Self-pay | Admitting: Internal Medicine

## 2022-06-25 ENCOUNTER — Telehealth: Payer: Self-pay | Admitting: Nurse Practitioner

## 2022-06-25 NOTE — Telephone Encounter (Signed)
Patient is pregnant , and has had a recent fall. OBGYN office cannot get in for ultrasound until 9/19. Wants to see if provider can send order in for ultrasound.  Patient wants a call back

## 2022-06-25 NOTE — Telephone Encounter (Signed)
Please advise 

## 2022-06-25 NOTE — Telephone Encounter (Signed)
Returned pt called and told her if she had any bleeding or pain she would need to go to the er

## 2022-06-25 NOTE — Telephone Encounter (Signed)
Pt called stating that she is pregnant and fell. She called ER to see if she could walk in for [place]. She states her OB can't see her till 07/06/22. Can you please order an [place] to check?

## 2022-06-29 NOTE — Telephone Encounter (Signed)
Pt was informed by Cyril Mourning, double messages sent

## 2022-07-06 DIAGNOSIS — O99341 Other mental disorders complicating pregnancy, first trimester: Secondary | ICD-10-CM | POA: Diagnosis not present

## 2022-07-06 DIAGNOSIS — Z8632 Personal history of gestational diabetes: Secondary | ICD-10-CM | POA: Diagnosis not present

## 2022-07-06 DIAGNOSIS — N912 Amenorrhea, unspecified: Secondary | ICD-10-CM | POA: Diagnosis not present

## 2022-07-06 DIAGNOSIS — Z3201 Encounter for pregnancy test, result positive: Secondary | ICD-10-CM | POA: Diagnosis not present

## 2022-07-06 DIAGNOSIS — Z3A00 Weeks of gestation of pregnancy not specified: Secondary | ICD-10-CM | POA: Diagnosis not present

## 2022-07-06 DIAGNOSIS — F419 Anxiety disorder, unspecified: Secondary | ICD-10-CM | POA: Diagnosis not present

## 2022-07-06 DIAGNOSIS — H9193 Unspecified hearing loss, bilateral: Secondary | ICD-10-CM | POA: Diagnosis not present

## 2022-07-06 DIAGNOSIS — F32A Depression, unspecified: Secondary | ICD-10-CM | POA: Diagnosis not present

## 2022-07-06 DIAGNOSIS — Z3689 Encounter for other specified antenatal screening: Secondary | ICD-10-CM | POA: Diagnosis not present

## 2022-07-06 DIAGNOSIS — O99210 Obesity complicating pregnancy, unspecified trimester: Secondary | ICD-10-CM | POA: Diagnosis not present

## 2022-07-06 DIAGNOSIS — O09299 Supervision of pregnancy with other poor reproductive or obstetric history, unspecified trimester: Secondary | ICD-10-CM | POA: Diagnosis not present

## 2022-07-23 ENCOUNTER — Other Ambulatory Visit: Payer: Self-pay

## 2022-07-23 MED ORDER — ONDANSETRON HCL 8 MG PO TABS: 8.0000 mg | ORAL_TABLET | Freq: Three times a day (TID) | ORAL | 0 refills | Status: DC | PRN

## 2022-11-01 ENCOUNTER — Other Ambulatory Visit (HOSPITAL_COMMUNITY): Payer: Self-pay | Admitting: Psychiatry

## 2022-11-01 ENCOUNTER — Ambulatory Visit (HOSPITAL_COMMUNITY): Payer: Medicaid Other | Admitting: Psychiatry

## 2022-11-01 ENCOUNTER — Telehealth (HOSPITAL_COMMUNITY): Payer: Self-pay

## 2022-11-01 MED ORDER — METHYLPHENIDATE HCL ER (OSM) 36 MG PO TBCR: 36.0000 mg | EXTENDED_RELEASE_TABLET | Freq: Every day | ORAL | 0 refills | Status: DC

## 2022-11-01 MED ORDER — FLUOXETINE HCL 40 MG PO CAPS
40.0000 mg | ORAL_CAPSULE | Freq: Every day | ORAL | 2 refills | Status: DC
Start: 2022-11-01 — End: 2022-11-16

## 2022-11-01 NOTE — Telephone Encounter (Signed)
Pt aware & verbalized understanding 

## 2022-11-01 NOTE — Telephone Encounter (Signed)
Pt was scheduled for today 11/01/22, r/s'd appt to 11/16/22. Pt is needing a refill on her FLUoxetine (PROZAC) 40 MG capsule  and her methylphenidate (CONCERTA) 36 MG PO CR tablet sent to [facility] in [place]. Please advise.

## 2022-11-01 NOTE — Telephone Encounter (Signed)
sent

## 2022-11-11 ENCOUNTER — Other Ambulatory Visit: Payer: Self-pay | Admitting: Internal Medicine

## 2022-11-11 DIAGNOSIS — G44009 Cluster headache syndrome, unspecified, not intractable: Secondary | ICD-10-CM

## 2022-11-11 MED ORDER — RIZATRIPTAN BENZOATE 5 MG PO TABS: 5.0000 mg | ORAL_TABLET | ORAL | 0 refills | Status: DC | PRN

## 2022-11-16 ENCOUNTER — Telehealth (INDEPENDENT_AMBULATORY_CARE_PROVIDER_SITE_OTHER): Payer: Medicaid Other | Admitting: Psychiatry

## 2022-11-16 ENCOUNTER — Encounter (HOSPITAL_COMMUNITY): Payer: Self-pay | Admitting: Psychiatry

## 2022-11-16 DIAGNOSIS — F331 Major depressive disorder, recurrent, moderate: Secondary | ICD-10-CM

## 2022-11-16 DIAGNOSIS — F900 Attention-deficit hyperactivity disorder, predominantly inattentive type: Secondary | ICD-10-CM | POA: Diagnosis not present

## 2022-11-16 MED ORDER — FLUOXETINE HCL 40 MG PO CAPS: 40.0000 mg | ORAL_CAPSULE | Freq: Every day | ORAL | 2 refills | Status: DC

## 2022-11-16 MED ORDER — TRAZODONE HCL 50 MG PO TABS
50.0000 mg | ORAL_TABLET | Freq: Every day | ORAL | 2 refills | Status: DC
Start: 2022-11-16 — End: 2023-02-17

## 2022-11-16 MED ORDER — METHYLPHENIDATE HCL ER (OSM) 36 MG PO TBCR: 36.0000 mg | EXTENDED_RELEASE_TABLET | Freq: Every day | ORAL | 0 refills | Status: DC

## 2022-11-16 NOTE — Progress Notes (Signed)
Virtual Visit via Video Note  I connected with Lacey Hartman on 11/16/22 at  9:20 AM EST by a video enabled telemedicine application and verified that I am speaking with the correct person using two identifiers.  Location: Patient: home Provider: office   I discussed the limitations of evaluation and management by telemedicine and the availability of in person appointments. The patient expressed understanding and agreed to proceed.      I discussed the assessment and treatment plan with the patient. The patient was provided an opportunity to ask questions and all were answered. The patient agreed with the plan and demonstrated an understanding of the instructions.   The patient was advised to call back or seek an in-person evaluation if the symptoms worsen or if the condition fails to improve as anticipated.  I provided 15 minutes of non-face-to-face time during this encounter.   Levonne Spiller, MD  [facility] MD/PA/NP OP Progress Note  11/16/2022 9:38 AM Lacey Hartman  MRN:  [id number]  Chief Complaint:  Chief Complaint  Patient presents with   Depression   Anxiety   Follow-up   HPI: This patient is a 27 year old single white female who lives with her boyfriend 2 daughters and 1 son in [place].  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  The patient returns for follow-up after about 7 months.  She states a lot of things have happened since I last saw her.  She states that she was pregnant back in September but then suffered a miscarriage.  I do not see any evidence of this in the chart although I do not see that she was seen for early pregnancy in September.  However in 10/25/23 her 61 year old brother died suddenly of a massive heart attack.  This has been a blow to her and the entire family.  The patient states that she has been feeling more depressed and anxious.  She has been having trouble sleeping and having a lot of nightmares.  She ran out of the trazodone since she  has not seen me since September.  She states that she is still taking the Prozac and it is helping to some degree.  She is trying to stay busy and active with her kids.  The Concerta continues to help her with focus.  I think if she can get more sleep she would be feeling better and we will also try to get her into therapy. Visit Diagnosis:    ICD-10-CM   1. Moderate episode of recurrent major depressive disorder (HCC)  F33.1     2. 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 Three primary cancers   Cancer Paternal Grandmother        breast   Angioedema Neg Hx    Atopy Neg Hx    Eczema Neg Hx    Immunodeficiency Neg Hx    Urticaria Neg Hx     Social History:  Social History   Socioeconomic History   Marital status: Significant Other    Spouse name: [name]   Number of children: 3   Years of  education: Not on file   Highest education level: 9th grade  Occupational History   Not on file  Tobacco Use   Smoking status: Former    Packs/day: 0.33    Years: 7.00    Total pack years: 2.31    Types: Cigarettes    Quit date: 2018    Years since quitting: 6.0    Passive exposure: Current   Smokeless tobacco: Never  Vaping Use   Vaping Use: Never used  Substance and Sexual Activity   Alcohol use: No    Alcohol/week: 0.0 standard drinks of alcohol   Drug use: No   Sexual activity: Yes    Birth control/protection: Pill  Other Topics Concern   Not on file  Social History Narrative   Lives with [name] and 3 children   [name]   [name]      Enjoys: playing with kids, going outside, beach, crafts      Diet: does not eat a lot of veggies or fruits, a lot of junk    Caffeine: 15 sodas   Water: 4-5 cups daily      Wears seat belt    Smoke detectors   Does not use phone while driving    Social Determinants of Health   Financial Resource Strain: Low Risk  (11/22/2019)   Overall Financial Resource Strain (CARDIA)    Difficulty of Paying Living Expenses: Not hard at all  Food Insecurity: No Food Insecurity (11/22/2019)   Hunger Vital Sign    Worried About Running Out of Food in the Last Year: Never true    [place] in the Last Year: Never true  Transportation Needs: No Transportation Needs (11/22/2019)   PRAPARE - Hydrologist (Medical): No    Lack of Transportation (Non-Medical): No  Physical Activity: Inactive (11/22/2019)   Exercise Vital Sign    Days of Exercise per Week: 0 days    Minutes of Exercise per Session: 0 min  Stress: Stress Concern Present (11/22/2019)   [place]    Feeling of Stress : To some extent  Social Connections: Moderately Isolated (11/22/2019)   Social Connection and Isolation Panel [NHANES]    Frequency of Communication with Friends and  Family: More than three times a week    Frequency of Social Gatherings with Friends and Family: More  than three times a week    Attends Religious Services: Never    Active Member of Clubs or Organizations: No    Attends Archivist Meetings: Never    Marital Status: Living with partner    Allergies:  Allergies  Allergen Reactions   Codeine Hives, Swelling and Rash    & hallucinations/    Metabolic Disorder Labs: Lab Results  Component Value Date   HGBA1C 5.1 12/08/2021   No results found for: "PROLACTIN" Lab Results  Component Value Date   CHOL 141 12/08/2021   TRIG 86 12/08/2021   HDL 41 12/08/2021   CHOLHDL 3.4 12/08/2021   LDLCALC 83 12/08/2021   LDLCALC 89 07/24/2019   Lab Results  Component Value Date   TSH 1.400 12/08/2021   TSH 1.780 07/29/2020    Therapeutic Level Labs: No results found for: "LITHIUM" No results found for: "VALPROATE" No results found for: "CBMZ"  Current Medications: Current Outpatient Medications  Medication Sig Dispense Refill   albuterol (VENTOLIN HFA) 108 (90 Base) MCG/ACT inhaler Inhale 1-2 puffs into the lungs every 4 (four) hours as needed for wheezing or shortness of breath. 18 g 1   Cholecalciferol (VITAMIN D3) 25 MCG (1000 UT) CAPS Take 1 capsule (1,000 Units total) by mouth daily. 60 capsule 3   diphenhydrAMINE HCl (BENADRYL ALLERGY PO) Take 1 tablet by mouth daily as needed (allergic reaction).     FLUoxetine (PROZAC) 40 MG capsule Take 1 capsule (40 mg total) by mouth daily. 30 capsule 2   fluticasone (FLONASE) 50 MCG/ACT nasal spray Use 2 sprays in each nostril BID for a week. After 1 week, decrease to 1 spray in each nostril BID as needed for congestion/allergies. 16 g 6   lubiprostone (AMITIZA) 8 MCG capsule One tablet once to twice daily with food for constipation. NEED OFFICE VISIT FOR MORE REFILLS 60 capsule 3   meclizine (ANTIVERT) 25 MG tablet Take 1 tablet (25 mg total) by mouth 2 (two) times daily as needed  for dizziness. 14 tablet 0   methylphenidate (CONCERTA) 36 MG PO CR tablet Take 1 tablet (36 mg total) by mouth daily. 30 tablet 0   neomycin-polymyxin-hydrocortisone (CORTISPORIN) 3.5-10000-1 OTIC suspension Place 3 drops into the right ear 3 (three) times daily. Please place 3 drops into right ear 3 times daily for 7 days. (Patient not taking: Reported on 05/11/2022) 3 mL 0   omeprazole (PRILOSEC) 20 MG capsule Take one tablet daily in the evening. 90 capsule 1   ondansetron (ZOFRAN) 8 MG tablet Take 1 tablet (8 mg total) by mouth every 8 (eight) hours as needed for nausea or vomiting. 30 tablet 0   rizatriptan (MAXALT) 5 MG tablet Take 1 tablet (5 mg total) by mouth as needed for migraine. May repeat in 2 hours if needed 10 tablet 0   Saccharomyces boulardii (PROBIOTIC) 250 MG CAPS Take 1 capsule by mouth at bedtime. 30 capsule 1   traZODone (DESYREL) 50 MG tablet Take 1 tablet (50 mg total) by mouth at bedtime. 30 tablet 2   No current facility-administered medications for this visit.     Musculoskeletal: Strength & Muscle Tone: within normal limits Gait & Station: normal Patient leans: N/A  Psychiatric Specialty Exam: Review of Systems  Psychiatric/Behavioral:  Positive for dysphoric mood and sleep disturbance.   All other systems reviewed and are negative.   There were no vitals taken for this visit.There is no height or weight on file to calculate BMI.  General Appearance:  Casual and Fairly Groomed  Eye Contact:  Good  Speech:  Clear and Coherent  Volume:  Normal  Mood:  Dysphoric  Affect:  Congruent  Thought Process:  Goal Directed  Orientation:  Full (Time, Place, and Person)  Thought Content: Rumination   Suicidal Thoughts:  No  Homicidal Thoughts:  No  Memory:  Immediate;   Good Recent;   Fair Remote;   NA  Judgement:  Fair  Insight:  Fair  Psychomotor Activity:  Decreased  Concentration:  Concentration: Good and Attention Span: Good  Recall:  [place] of  Knowledge: Fair  Language: Good  Akathisia:  No  Handed:  Right  AIMS (if indicated): not done  Assets:  Communication Skills Desire for Improvement Physical Health Resilience Social Support  ADL's:  Intact  Cognition: Impaired,  Mild  Sleep:  Poor   Screenings: GAD-7    Flowsheet Row Video Visit from 03/11/2020 in [facility] Office Visit from 12/20/2019 in [facility] Office Visit from 11/22/2019 in [facility]  Total GAD-7 Score '5 18 14      '$ PHQ2-9    Flowsheet Row Video Visit from 11/16/2022 in [facility] at [place] Visit from 06/17/2022 in [facility] Office Visit from 05/11/2022 in [facility] Video Visit from 03/19/2022 in [facility] [place] Visit from 12/03/2021 in [facility]  PHQ-2 Total Score 3 0 0 0 0  PHQ-9 Total Score 9 -- -- -- --      Flowsheet Row Video Visit from 11/16/2022 in [place] at [place] ED from 04/28/2022 in [facility] Emergency Department at [facility] Video Visit from 03/19/2022 in [place] [facility] [place] No Risk No Risk No Risk        Assessment and Plan: This patient is a 27 year old female with a history of mild cognitive impairment hearing loss depression and ADD.  Her depression has worsened since her recent miscarriage and loss of her brother and also due to lack of sleep.  We will add back the trazodone 50 mg at bedtime to help with sleep.  She will continue Concerta 36 mg every morning for ADD and Prozac 40 mg daily for depression.  She will return to see me in 4 weeks  Collaboration of Care: Collaboration of Care: Referral or follow-up with counselor/therapist AEB patient will be referred to a therapist in our  office.  Patient/Guardian was advised Release of Information must be obtained prior to any record release in order to collaborate their care with an outside provider. Patient/Guardian was advised if they have not already done so to contact the registration department to sign all necessary forms in order for [place] to release information regarding their care.   Consent: Patient/Guardian gives verbal consent for treatment and assignment of benefits for services provided during this visit. Patient/Guardian expressed understanding and agreed to proceed.    Levonne Spiller, MD 11/16/2022, 9:38 AM

## 2022-12-06 ENCOUNTER — Encounter: Payer: Medicaid Other | Admitting: Nurse Practitioner

## 2022-12-07 ENCOUNTER — Encounter: Payer: Medicaid Other | Admitting: Internal Medicine

## 2022-12-07 ENCOUNTER — Encounter: Payer: Medicaid Other | Admitting: Nurse Practitioner

## 2022-12-14 ENCOUNTER — Telehealth (HOSPITAL_COMMUNITY): Payer: Medicaid Other | Admitting: Psychiatry

## 2022-12-16 ENCOUNTER — Encounter: Payer: Self-pay | Admitting: Radiology

## 2023-01-12 ENCOUNTER — Ambulatory Visit (HOSPITAL_COMMUNITY): Payer: 59 | Admitting: Clinical

## 2023-01-12 ENCOUNTER — Telehealth (HOSPITAL_COMMUNITY): Payer: Self-pay | Admitting: Clinical

## 2023-01-12 NOTE — Telephone Encounter (Signed)
The patient was a no show 

## 2023-02-03 ENCOUNTER — Ambulatory Visit (HOSPITAL_COMMUNITY): Payer: 59 | Admitting: Psychiatry

## 2023-02-04 ENCOUNTER — Ambulatory Visit: Payer: Medicaid Other | Admitting: Internal Medicine

## 2023-02-04 ENCOUNTER — Encounter: Payer: Self-pay | Admitting: Internal Medicine

## 2023-02-04 VITALS — BP 121/78 | HR 87 | Ht 60.0 in | Wt 182.6 lb

## 2023-02-04 DIAGNOSIS — B9689 Other specified bacterial agents as the cause of diseases classified elsewhere: Secondary | ICD-10-CM | POA: Diagnosis not present

## 2023-02-04 DIAGNOSIS — J019 Acute sinusitis, unspecified: Secondary | ICD-10-CM

## 2023-02-04 MED ORDER — AMOXICILLIN-POT CLAVULANATE 875-125 MG PO TABS: 1.0000 | ORAL_TABLET | Freq: Two times a day (BID) | ORAL | 0 refills | Status: AC

## 2023-02-04 NOTE — Patient Instructions (Signed)
It was a pleasure to see you today.  Thank you for giving [place] the opportunity to be involved in your care.  Below is a brief recap of your visit and next steps.  We will plan to see you again in 4 months.  Summary Start Augmentin x 5 days for sinus infection Follow up in 4 months

## 2023-02-04 NOTE — Progress Notes (Unsigned)
Acute Office Visit  Subjective:     Patient ID: Lacey Hartman, female    DOB: 04-May-1996, 27 y.o.   MRN: [id number]  Chief Complaint  Patient presents with   Sinusitis    Ears and throat sore since 02/03/2023   Lacey Hartman presents for an acute visit today endorsing a 1 day history of left maxillary sinus pain/pressure with dark yellow nasal secretions.  She additionally endorses fullness in her ears and throat irritation.  She denies fever/chills.  She is unaware of any recent sick contacts.  Lacey Hartman has tried taking OTC cough/cold medication without significant symptomatic improvement.  Review of Systems  Constitutional:  Negative for chills, fever and malaise/fatigue.  HENT:  Positive for congestion and sinus pain.   Respiratory:  Negative for cough.       Objective:    BP 121/78   Pulse 87   Ht 5' (1.524 m)   Wt 182 lb 9.6 oz (82.8 kg)   SpO2 98%   BMI 35.66 kg/m   Physical Exam Vitals reviewed.  Constitutional:      Appearance: Normal appearance.  HENT:     Head: Normocephalic and atraumatic.     Right Ear: Tympanic membrane and external ear normal.     Left Ear: Tympanic membrane and external ear normal.     Nose: Congestion present.     Comments: TTP over left maxillary sinus    Mouth/Throat:     Mouth: Mucous membranes are moist.     Pharynx: Posterior oropharyngeal erythema present.  Eyes:     Extraocular Movements: Extraocular movements intact.     Conjunctiva/sclera: Conjunctivae normal.     Pupils: Pupils are equal, round, and reactive to light.  Cardiovascular:     Rate and Rhythm: Normal rate and regular rhythm.     Pulses: Normal pulses.     Heart sounds: Normal heart sounds.  Pulmonary:     Effort: Pulmonary effort is normal.     Breath sounds: Normal breath sounds. No wheezing, rhonchi or rales.  Abdominal:     General: Abdomen is flat. Bowel sounds are normal. There is no distension.     Palpations: Abdomen is soft.     Tenderness: There  is no abdominal tenderness.  Musculoskeletal:     Cervical back: Normal range of motion.  Skin:    General: Skin is warm and dry.     Capillary Refill: Capillary refill takes less than 2 seconds.  Neurological:     General: No focal deficit present.     Mental Status: She is alert and oriented to person, place, and time.       Assessment & Plan:   Problem List Items Addressed This Visit       Acute bacterial sinusitis - Primary    Presenting today for an acute visit endorsing a 1 day history of left maxillary sinus pain/pressure with dark yellow nasal secretions.  On exam there is tenderness palpation over the left maxillary sinus.  Her current symptoms and exam findings are concerning for bacterial sinusitis. -I have prescribed Augmentin x 5 days for treatment -She was encouraged to continue current supportive care measures -She will return to care if her symptoms worsen or fail to improve.       Meds ordered this encounter  Medications   amoxicillin-clavulanate (AUGMENTIN) 875-125 MG tablet    Sig: Take 1 tablet by mouth 2 (two) times daily for 5 days.    Dispense:  10  tablet    Refill:  0    Return in about 4 months (around 06/06/2023).  Billie Lade, MD

## 2023-02-05 ENCOUNTER — Emergency Department (HOSPITAL_COMMUNITY)
Admission: EM | Admit: 2023-02-05 | Discharge: 2023-02-05 | Disposition: A | Discharge status: Home / Self Care | Payer: Medicaid Other | Attending: Emergency Medicine | Admitting: Emergency Medicine

## 2023-02-05 ENCOUNTER — Other Ambulatory Visit: Payer: Self-pay

## 2023-02-05 DIAGNOSIS — B9789 Other viral agents as the cause of diseases classified elsewhere: Secondary | ICD-10-CM | POA: Insufficient documentation

## 2023-02-05 DIAGNOSIS — R0981 Nasal congestion: Secondary | ICD-10-CM | POA: Diagnosis present

## 2023-02-05 DIAGNOSIS — J069 Acute upper respiratory infection, unspecified: Secondary | ICD-10-CM | POA: Insufficient documentation

## 2023-02-05 DIAGNOSIS — Z20822 Contact with and (suspected) exposure to covid-19: Secondary | ICD-10-CM | POA: Diagnosis not present

## 2023-02-05 LAB — GROUP A STREP BY PCR: Group A Strep by PCR: NOT DETECTED

## 2023-02-05 LAB — SARS CORONAVIRUS 2 BY RT PCR: SARS Coronavirus 2 by RT PCR: NEGATIVE

## 2023-02-05 MED ORDER — IBUPROFEN 400 MG PO TABS
600.0000 mg | ORAL_TABLET | Freq: Once | ORAL | Status: AC
  Administered 2023-02-05: 600 mg via ORAL
  Filled 2023-02-05: qty 2

## 2023-02-05 NOTE — ED Provider Notes (Signed)
[place] EMERGENCY DEPARTMENT AT [facility] Provider Note   CSN: [id number] Arrival date & time: 02/05/23  2021     History  No chief complaint on file.   Lacey Hartman is a 27 y.o. female.  She denies any PMH.  Presents to ER complaining of sinus congestion, throat, cough body aches and chills.  This started 2 days ago.  She was seen by her PCP yesterday diagnosed with bacterial sinusitis and started on Augmentin.  She states she is taking over-the-counter cold medication with minimal relief.  She denies sick contacts, no chest pain or shortness of breath.  Cough is nonproductive.  She states her symptoms have not worsened but she is just not feeling any better and wanted to be evaluated so she came to the ED today.  HPI     Home Medications Prior to Admission medications   Medication Sig Start Date End Date Taking? Authorizing Provider  albuterol (VENTOLIN HFA) 108 (90 Base) MCG/ACT inhaler Inhale 1-2 puffs into the lungs every 4 (four) hours as needed for wheezing or shortness of breath. 06/17/22   Billie Lade, MD  amoxicillin-clavulanate (AUGMENTIN) 875-125 MG tablet Take 1 tablet by mouth 2 (two) times daily for 5 days. 02/04/23 02/09/23  Billie Lade, MD  Cholecalciferol (VITAMIN D3) 25 MCG (1000 UT) CAPS Take 1 capsule (1,000 Units total) by mouth daily. 12/09/21   Donell Beers, FNP  diphenhydrAMINE HCl (BENADRYL ALLERGY PO) Take 1 tablet by mouth daily as needed (allergic reaction).    [provider]  FLUoxetine (PROZAC) 40 MG capsule Take 1 capsule (40 mg total) by mouth daily. 11/16/22   Myrlene Broker, MD  fluticasone (FLONASE) 50 MCG/ACT nasal spray Use 2 sprays in each nostril BID for a week. After 1 week, decrease to 1 spray in each nostril BID as needed for congestion/allergies. 06/17/22   Billie Lade, MD  lubiprostone (AMITIZA) 8 MCG capsule One tablet once to twice daily with food for constipation. NEED OFFICE VISIT FOR MORE  REFILLS 06/17/22   Billie Lade, MD  meclizine (ANTIVERT) 25 MG tablet Take 1 tablet (25 mg total) by mouth 2 (two) times daily as needed for dizziness. 12/03/21   Donell Beers, FNP  methylphenidate (CONCERTA) 36 MG PO CR tablet Take 1 tablet (36 mg total) by mouth daily. 11/16/22 11/16/23  Myrlene Broker, MD  neomycin-polymyxin-hydrocortisone (CORTISPORIN) 3.5-10000-1 OTIC suspension Place 3 drops into the right ear 3 (three) times daily. Please place 3 drops into right ear 3 times daily for 7 days. 04/28/22   Al Decant, PA-C  omeprazole (PRILOSEC) 20 MG capsule Take one tablet daily in the evening. 12/03/21   Paseda, Baird Kay, FNP  ondansetron (ZOFRAN) 8 MG tablet Take 1 tablet (8 mg total) by mouth every 8 (eight) hours as needed for nausea or vomiting. 07/23/22   Billie Lade, MD  rizatriptan (MAXALT) 5 MG tablet Take 1 tablet (5 mg total) by mouth as needed for migraine. May repeat in 2 hours if needed 11/11/22   Billie Lade, MD  Saccharomyces boulardii (PROBIOTIC) 250 MG CAPS Take 1 capsule by mouth at bedtime. 12/03/21   Donell Beers, FNP  traZODone (DESYREL) 50 MG tablet Take 1 tablet (50 mg total) by mouth at bedtime. 11/16/22   Myrlene Broker, MD      Allergies    Codeine    Review of Systems   Review of Systems  Physical Exam  Updated Vital Signs BP (!) 140/96   Pulse (!) 111   Temp 98.8 F (37.1 C)   Resp 18   Ht 5' (1.524 m)   Wt 82 kg   SpO2 100%   BMI 35.31 kg/m  Physical Exam Vitals and nursing note reviewed.  Constitutional:      General: She is not in acute distress.    Appearance: She is well-developed.  HENT:     Head: Normocephalic and atraumatic.     Left Ear: Tympanic membrane normal.     Mouth/Throat:     Mouth: Mucous membranes are moist.  Eyes:     Conjunctiva/sclera: Conjunctivae normal.  Cardiovascular:     Rate and Rhythm: Normal rate and regular rhythm.     Heart sounds: No murmur heard. Pulmonary:     Effort:  Pulmonary effort is normal. No respiratory distress.     Breath sounds: Normal breath sounds.  Abdominal:     Palpations: Abdomen is soft.     Tenderness: There is no abdominal tenderness.  Musculoskeletal:        General: No swelling. Normal range of motion.     Cervical back: Neck supple.  Skin:    General: Skin is warm and dry.     Capillary Refill: Capillary refill takes less than 2 seconds.  Neurological:     General: No focal deficit present.     Mental Status: She is alert.  Psychiatric:        Mood and Affect: Mood normal.     ED Results / Procedures / Treatments   Labs (all labs ordered are listed, but only abnormal results are displayed) Labs Reviewed  SARS CORONAVIRUS 2 BY RT PCR  GROUP A STREP BY PCR    EKG None  Radiology No results found.  Procedures Procedures    Medications Ordered in ED Medications  ibuprofen (ADVIL) tablet 600 mg (600 mg Oral Given 02/05/23 2143)    ED Course/ Medical Decision Making/ A&P                             Medical Decision Making Ddx: Influenza, URI, sinusitis, pneumonia, otitis media, other ED Course: Patient feeling unwell with URI symptoms, body aches and chills, sore throat, cough.  She is well-appearing with reassuring exam, already on Augmentin. Negative COVID and strep.  Initially she was tachycardic, repeat exam patient's heart rate was 104,, she is able to orally hydrate at home.  She has not short of breath, no chest pain or pleurisy.  Discussed with her likely viral syndrome, continue supportive care at home given strict return precautions.  Risk Prescription drug management.           Final Clinical Impression(s) / ED Diagnoses Final diagnoses:  Viral upper respiratory tract infection    Rx / DC Orders ED Discharge Orders     None         Josem Kaufmann 02/05/23 2236    Derwood Kaplan, MD 02/09/23 1336

## 2023-02-05 NOTE — ED Triage Notes (Signed)
Pt c/o cough, sore throat, congestion, and fever.

## 2023-02-05 NOTE — Discharge Instructions (Addendum)
The primary care doctor, come back to the ER for new or worsening symptoms.

## 2023-02-09 ENCOUNTER — Encounter: Payer: Self-pay | Admitting: Internal Medicine

## 2023-02-09 DIAGNOSIS — B9689 Other specified bacterial agents as the cause of diseases classified elsewhere: Secondary | ICD-10-CM | POA: Insufficient documentation

## 2023-02-09 NOTE — Assessment & Plan Note (Signed)
Presenting today for an acute visit endorsing a 1 day history of left maxillary sinus pain/pressure with dark yellow nasal secretions.  On exam there is tenderness palpation over the left maxillary sinus.  Her current symptoms and exam findings are concerning for bacterial sinusitis. -I have prescribed Augmentin x 5 days for treatment -She was encouraged to continue current supportive care measures -She will return to care if her symptoms worsen or fail to improve.

## 2023-02-11 ENCOUNTER — Ambulatory Visit (HOSPITAL_COMMUNITY): Payer: 59 | Admitting: Psychiatry

## 2023-02-17 ENCOUNTER — Ambulatory Visit (INDEPENDENT_AMBULATORY_CARE_PROVIDER_SITE_OTHER): Payer: 59 | Admitting: Psychiatry

## 2023-02-17 ENCOUNTER — Encounter (HOSPITAL_COMMUNITY): Payer: Self-pay | Admitting: Psychiatry

## 2023-02-17 VITALS — BP 113/73 | HR 82 | Ht 60.0 in | Wt 182.0 lb

## 2023-02-17 DIAGNOSIS — F900 Attention-deficit hyperactivity disorder, predominantly inattentive type: Secondary | ICD-10-CM

## 2023-02-17 DIAGNOSIS — F331 Major depressive disorder, recurrent, moderate: Secondary | ICD-10-CM | POA: Diagnosis not present

## 2023-02-17 MED ORDER — METHYLPHENIDATE HCL ER (OSM) 36 MG PO TBCR: 36.0000 mg | EXTENDED_RELEASE_TABLET | ORAL | 0 refills | Status: DC

## 2023-02-17 MED ORDER — CLONAZEPAM 0.5 MG PO TABS: 0.5000 mg | ORAL_TABLET | Freq: Two times a day (BID) | ORAL | 2 refills | Status: DC | PRN

## 2023-02-17 MED ORDER — FLUOXETINE HCL 40 MG PO CAPS: 40.0000 mg | ORAL_CAPSULE | Freq: Every day | ORAL | 2 refills | Status: DC

## 2023-02-17 MED ORDER — TRAZODONE HCL 50 MG PO TABS: 50.0000 mg | ORAL_TABLET | Freq: Every day | ORAL | 2 refills | Status: DC

## 2023-02-17 MED ORDER — METHYLPHENIDATE HCL ER (OSM) 36 MG PO TBCR: 36.0000 mg | EXTENDED_RELEASE_TABLET | Freq: Every day | ORAL | 0 refills | Status: DC

## 2023-02-17 NOTE — Progress Notes (Signed)
BH MD/PA/NP OP Progress Note  02/17/2023 2:27 PM Lacey Hartman  MRN:  [id number]  Chief Complaint:  Chief Complaint  Patient presents with   Depression   Anxiety   Follow-up   HPI: This patient is a 27 year old white female who lives with her boyfriend 2 daughters and 1 son in [place].  She is on disability and a full-time homemaker.  The patient returns for follow-up after 3 months for her depression anxiety and ADHD.  She states right now she is under a lot of stress.  She states that she her boyfriend has 2 brothers and parents all live on the same property.  She is the only adult who is not working so his parents are constantly asking her to do things for them to pay their bills taking the medical appointments etc.  She is overwhelmed by this and also by taking care of her own children who have ADHD and other issues.  She is still grieving the loss of the baby that that she lost to miscarriage last October as well as her brother who died in 2023-10-14 of a sudden heart attack.  She is very anxious and jittery.  I suggest that we add a medication for anxiety and she is in agreement. Visit Diagnosis:    ICD-10-CM   1. Moderate episode of recurrent major depressive disorder (HCC)  F33.1     2. 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 Three primary cancers   Cancer Paternal Grandmother        breast   Angioedema Neg Hx    Atopy Neg Hx    Eczema Neg Hx    Immunodeficiency Neg Hx    Urticaria Neg Hx     Social History:  Social History   Socioeconomic History   Marital status: Significant Other    Spouse name: Molly Maduro   Number of children: 3   Years of education: Not on file   Highest education level: 9th grade  Occupational History   Not on  file  Tobacco Use   Smoking status: Former    Packs/day: 0.33    Years: 7.00    Additional pack years: 0.00    Total pack years: 2.31    Types: Cigarettes    Quit date: 2018    Years since quitting: 6.3    Passive exposure: Current   Smokeless tobacco: Never  Vaping Use   Vaping Use: Never used  Substance and Sexual Activity   Alcohol use: No    Alcohol/week: 0.0 standard drinks of alcohol   Drug use: No   Sexual activity: Yes    Birth control/protection: Pill  Other Topics Concern   Not on file  Social History Narrative   Lives with Molly Maduro and 3 children   [name]   Nelda [place]      Enjoys: playing with kids, going outside, beach, crafts      Diet: does not eat a lot of veggies or fruits, a lot of junk    Caffeine: 15 sodas   Water: 4-5 cups daily      Wears seat belt    Smoke detectors   Does not use phone while driving    Social Determinants of Health   Financial Resource Strain: Low Risk  (11/22/2019)   Overall Financial Resource Strain (CARDIA)    Difficulty of Paying Living Expenses: Not hard at all   Food Insecurity: No Food Insecurity (11/22/2019)   Hunger Vital Sign    Worried About Running Out of Food in the Last Year: Never true    Ran Out of Food in the Last Year: Never true  Transportation Needs: No Transportation Needs (11/22/2019)   PRAPARE - Administrator, Civil Service (Medical): No    Lack of Transportation (Non-Medical): No  Physical Activity: Inactive (11/22/2019)   Exercise Vital Sign    Days of Exercise per Week: 0 days    Minutes of Exercise per Session: 0 min  Stress: Stress Concern Present (11/22/2019)   [employer] of Occupational Health - Occupational Stress Questionnaire    Feeling of Stress : To some extent  Social Connections: Moderately Isolated (11/22/2019)   Social Connection and Isolation Panel [NHANES]    Frequency of Communication with Friends and Family: More than three times a week    Frequency of Social Gatherings with Friends and Family: More than three times a week    Attends Religious Services: Never    Database administrator or Organizations: No    Attends Banker Meetings: Never    Marital Status: Living with partner    Allergies:  Allergies  Allergen Reactions   Codeine Hives, Swelling and Rash    & hallucinations/    Metabolic Disorder Labs: Lab Results  Component Value Date   HGBA1C 5.1 12/08/2021   No results found for: "PROLACTIN" Lab Results  Component Value Date   CHOL 141 12/08/2021   TRIG 86 12/08/2021   HDL 41 12/08/2021   CHOLHDL 3.4 12/08/2021   LDLCALC 83 12/08/2021   LDLCALC 89 07/24/2019   Lab Results  Component Value Date   TSH 1.400 12/08/2021   TSH 1.780 07/29/2020    Therapeutic Level Labs: No results found for: "LITHIUM" No results found for: "VALPROATE" No results found for: "CBMZ"  Current Medications: Current Outpatient Medications  Medication Sig Dispense Refill   albuterol (VENTOLIN HFA) 108 (90 Base) MCG/ACT inhaler Inhale 1-2 puffs into the lungs every 4 (four) hours  as needed for wheezing  or shortness of breath. 18 g 1   clonazePAM (KLONOPIN) 0.5 MG tablet Take 1 tablet (0.5 mg total) by mouth 2 (two) times daily as needed for anxiety. 60 tablet 2   methylphenidate (CONCERTA) 36 MG PO CR tablet Take 1 tablet (36 mg total) by mouth every morning. 30 tablet 0   FLUoxetine (PROZAC) 40 MG capsule Take 1 capsule (40 mg total) by mouth daily. 30 capsule 2   methylphenidate (CONCERTA) 36 MG PO CR tablet Take 1 tablet (36 mg total) by mouth daily. 30 tablet 0   traZODone (DESYREL) 50 MG tablet Take 1 tablet (50 mg total) by mouth at bedtime. 30 tablet 2   No current facility-administered medications for this visit.     Musculoskeletal: Strength & Muscle Tone: within normal limits Gait & Station: normal Patient leans: N/A  Psychiatric Specialty Exam: Review of Systems  Psychiatric/Behavioral:  Positive for dysphoric mood and sleep disturbance. The patient is nervous/anxious.   All other systems reviewed and are negative.   Blood pressure 113/73, pulse 82, height 5' (1.524 m), weight 182 lb (82.6 kg), last menstrual period 02/17/2023, SpO2 99 %.Body mass index is 35.54 kg/m.  General Appearance: Casual and Fairly Groomed  Eye Contact:  Good  Speech:  Clear and Coherent  Volume:  Normal  Mood:  Anxious and Dysphoric  Affect:  Congruent  Thought Process:  Goal Directed  Orientation:  Full (Time, Place, and Person)  Thought Content: Rumination   Suicidal Thoughts:  No  Homicidal Thoughts:  No  Memory:  Immediate;   Good Recent;   Fair Remote;   NA  Judgement:  Fair  Insight:  Fair  Psychomotor Activity:  Decreased  Concentration:  Concentration: Good and Attention Span: Good  Recall:  Fair  Fund of Knowledge: Fair  Language: Good  Akathisia:  No  Handed:  Right  AIMS (if indicated): not done  Assets:  Communication Skills Desire for Improvement Physical Health Resilience Social Support Talents/Skills  ADL's:  Intact  Cognition: Impaired,   Mild  Sleep:  Fair   Screenings: GAD-7    Flowsheet Row Office Visit from 02/17/2023 in [facility] [place] Office Visit from 02/04/2023 in [facility] Video Visit from 03/11/2020 in [facility] Office Visit from 12/20/2019 in [facility] Office Visit from 11/22/2019 in [facility]  Total GAD-7 Score 21 14 5 18 14       PHQ2-9    Flowsheet Row Office Visit from 02/17/2023 in [facility] [place] Office Visit from 02/04/2023 in [facility] Care Video Visit from 11/16/2022 in [facility] [place] Office Visit from 06/17/2022 in [facility] Office Visit from 05/11/2022 in [facility]  PHQ-2 Total Score 2 3 3  0 0  PHQ-9 Total Score 15 14 9  -- --      Flowsheet Row Office Visit from 02/17/2023 in [facility] [place] ED from 02/05/2023 in [facility] Emergency Department at [facility] Video Visit from 11/16/2022 in [facility] [place]  C-SSRS RISK CATEGORY No Risk No Risk No Risk        Assessment and Plan: Patient is a 27 year old female with a history of mild cognitive impairment hearing loss depression and ADD.  She has been more depressed and anxious but primarily anxious lately.  We  will add clonazepam 0.5 mg twice daily as needed for anxiety.  For now she will continue Prozac 40 mg daily for depression and trazodone 50 mg at bedtime for sleep and Concerta 36 mg every morning for ADD.  She will return to see me in 6 weeks.  Collaboration of Care: Collaboration of Care: Primary Care Provider AEB notes are shared with PCP through the epic system  Patient/Guardian was advised Release of Information must be obtained prior to any record release in order to collaborate  their care with an outside provider. Patient/Guardian was advised if they have not already done so to contact the registration department to sign all necessary forms in order for [place] to release information regarding their care.   Consent: Patient/Guardian gives verbal consent for treatment and assignment of benefits for services provided during this visit. Patient/Guardian expressed understanding and agreed to proceed.    Diannia Ruder, MD 02/17/2023, 2:27 PM

## 2023-03-28 ENCOUNTER — Telehealth: Payer: Self-pay | Admitting: Internal Medicine

## 2023-03-28 NOTE — Telephone Encounter (Signed)
Pt called in requesting a call back.  Has a note to attend jury duty but is unable to do so , patient is helping/ transporting another patient  to Chemo.  Wants to see if provider can write note so  that patient does not have to attend jury duty due to it falling on a chemo day.   Patient wants a call back.

## 2023-03-31 ENCOUNTER — Ambulatory Visit (HOSPITAL_COMMUNITY): Payer: 59 | Admitting: Psychiatry

## 2023-04-11 ENCOUNTER — Telehealth (HOSPITAL_COMMUNITY): Payer: Self-pay

## 2023-04-11 ENCOUNTER — Other Ambulatory Visit (HOSPITAL_COMMUNITY): Payer: Self-pay | Admitting: Psychiatry

## 2023-04-11 MED ORDER — METHYLPHENIDATE HCL ER (OSM) 36 MG PO TBCR: 36.0000 mg | EXTENDED_RELEASE_TABLET | Freq: Every day | ORAL | 0 refills | Status: DC

## 2023-04-11 NOTE — Telephone Encounter (Signed)
sent 

## 2023-04-11 NOTE — Telephone Encounter (Signed)
Pt call in needing a refill on her methylphenidate (CONCERTA) 36 MG PO CR tablet sent to [facility] in [place]. Pt scheduled 04/20/23. Please advise.

## 2023-04-11 NOTE — Telephone Encounter (Signed)
Spoke with caila advised rx has been sent to pharmacy she verbalized understanding

## 2023-04-15 NOTE — Telephone Encounter (Signed)
Jury duty forms  Copied 'noted Sleeved (forms in provider box)  Call patient when ready

## 2023-04-15 NOTE — Telephone Encounter (Signed)
Patient came by the office drop off the official jury summons form see below message. Patient is the care giver. Need by 07.15.2024. call patient when letter is ready.

## 2023-04-15 NOTE — Telephone Encounter (Signed)
Spoke with pt. Pt will come by and pick forms up.

## 2023-04-19 DIAGNOSIS — H6692 Otitis media, unspecified, left ear: Secondary | ICD-10-CM | POA: Diagnosis not present

## 2023-04-20 ENCOUNTER — Telehealth (INDEPENDENT_AMBULATORY_CARE_PROVIDER_SITE_OTHER): Payer: 59 | Admitting: Psychiatry

## 2023-04-20 ENCOUNTER — Encounter (HOSPITAL_COMMUNITY): Payer: Self-pay | Admitting: Psychiatry

## 2023-04-20 DIAGNOSIS — F331 Major depressive disorder, recurrent, moderate: Secondary | ICD-10-CM

## 2023-04-20 DIAGNOSIS — F900 Attention-deficit hyperactivity disorder, predominantly inattentive type: Secondary | ICD-10-CM | POA: Diagnosis not present

## 2023-04-20 MED ORDER — METHYLPHENIDATE HCL ER (OSM) 36 MG PO TBCR: 36.0000 mg | EXTENDED_RELEASE_TABLET | ORAL | 0 refills | Status: DC

## 2023-04-20 MED ORDER — FLUOXETINE HCL 40 MG PO CAPS: 40.0000 mg | ORAL_CAPSULE | Freq: Every day | ORAL | 2 refills | Status: DC

## 2023-04-20 MED ORDER — CLONAZEPAM 0.5 MG PO TABS: 0.5000 mg | ORAL_TABLET | Freq: Two times a day (BID) | ORAL | 2 refills | Status: DC | PRN

## 2023-04-20 MED ORDER — TRAZODONE HCL 50 MG PO TABS: 50.0000 mg | ORAL_TABLET | Freq: Every day | ORAL | 2 refills | Status: DC

## 2023-04-20 NOTE — Progress Notes (Signed)
Virtual Visit via Video Note  I connected with Lacey Hartman on 04/20/23 at 11:20 AM EDT by a video enabled telemedicine application and verified that I am speaking with the correct person using two identifiers.  Location: Patient: home Provider: office   I discussed the limitations of evaluation and management by telemedicine and the availability of in person appointments. The patient expressed understanding and agreed to proceed.      I discussed the assessment and treatment plan with the patient. The patient was provided an opportunity to ask questions and all were answered. The patient agreed with the plan and demonstrated an understanding of the instructions.   The patient was advised to call back or seek an in-person evaluation if the symptoms worsen or if the condition fails to improve as anticipated.  I provided 15 minutes of non-face-to-face time during this encounter.   Diannia Ruder, MD  [facility] MD/PA/NP OP Progress Note  04/20/2023 11:16 AM Lacey Hartman  MRN:  [id number]  Chief Complaint:  Chief Complaint  Patient presents with   ADHD   Depression   Follow-up   HPI: : This patient is a 27 year old white female who lives with her boyfriend 2 daughters and 1 son in [place].  She is on disability and a full-time homemaker.   The patient returns for follow-up after 2 months regarding her depression anxiety and ADHD.  Last time she seemed more anxious and overwhelmed.  She was doing a lot of work for her boyfriend's parents particularly helping him pay bills and driving them around.  She is now switched all the bill paying to online.  We did add clonazepam to her regimen and she thinks its helped considerably with her anxiety.  She is also less depressed.  She is sleeping better.  She is focusing well with the Concerta.  She denies any thoughts of self-harm or suicide Visit Diagnosis:    ICD-10-CM   1. Moderate episode of recurrent major depressive disorder (HCC)  F33.1      2. 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 Three primary cancers   Cancer Paternal Grandmother        breast   Angioedema Neg Hx    Atopy Neg Hx    Eczema Neg Hx    Immunodeficiency Neg Hx    Urticaria Neg Hx     Social History:  Social History   Socioeconomic History   Marital status: Significant Other    Spouse name: Molly Maduro   Hartman of children: 3   Years of education: Not on file   Highest education level: 9th grade  Occupational History   Not on file  Tobacco Use   Smoking status: Former    Packs/day: 0.33    Years: 7.00    Additional pack years: 0.00    Total pack years: 2.31    Types: Cigarettes    Quit date: 2018    Years since quitting: 6.5    Passive exposure: Current   Smokeless tobacco: Never  Vaping Use   Vaping Use: Never used  Substance and Sexual Activity    Alcohol use: No    Alcohol/week: 0.0 standard drinks of alcohol   Drug use: No   Sexual activity: Yes    Birth control/protection: Pill  Other Topics Concern   Not on file  Social History Narrative   Lives with Molly Maduro and 3 children   [name]   Nelda Marseille      Enjoys: playing with kids, going outside, beach, crafts      Diet: does not eat a lot of veggies or fruits, a lot of junk    Caffeine: 15 sodas   Water: 4-5 cups daily      Wears seat belt    Smoke detectors   Does not use phone while driving    Social Determinants of Health   Financial Resource Strain: Low Risk  (11/22/2019)   Overall Financial Resource Strain (CARDIA)    Difficulty of Paying Living Expenses: Not hard at all  Food Insecurity: No Food Insecurity (11/22/2019)   Hunger Vital Sign    Worried About Running Out of Food in the Last Year: Never true    Ran Out of Food in the Last Year: Never true  Transportation Needs: No Transportation Needs (11/22/2019)   PRAPARE - Administrator, Civil Service (Medical): No    Lack of Transportation (Non-Medical): No  Physical Activity: Inactive (11/22/2019)   Exercise Vital Sign    Days of Exercise per Week: 0 days    Minutes of Exercise per Session: 0 min  Stress: Stress Concern Present (11/22/2019)   [employer] of Occupational Health - Occupational Stress Questionnaire    Feeling of Stress : To some extent  Social Connections: Moderately Isolated (11/22/2019)   Social Connection and Isolation Panel [NHANES]    Frequency of Communication with Friends and Family: More than three times a week    Frequency of Social Gatherings with Friends and Family: More than three times a week    Attends Religious Services: Never    Database administrator or Organizations: No    Attends Banker Meetings: Never    Marital Status: Living with partner    Allergies:  Allergies  Allergen Reactions   Codeine Hives, Swelling and Rash    &  hallucinations/    Metabolic Disorder Labs: Lab Results  Component Value Date   HGBA1C 5.1 12/08/2021   No results found for: "PROLACTIN" Lab Results  Component  Value Date   CHOL 141 12/08/2021   TRIG 86 12/08/2021   HDL 41 12/08/2021   CHOLHDL 3.4 12/08/2021   LDLCALC 83 12/08/2021   LDLCALC 89 07/24/2019   Lab Results  Component Value Date   TSH 1.400 12/08/2021   TSH 1.780 07/29/2020    Therapeutic Level Labs: No results found for: "LITHIUM" No results found for: "VALPROATE" No results found for: "CBMZ"  Current Medications: Current Outpatient Medications  Medication Sig Dispense Refill   methylphenidate (CONCERTA) 36 MG PO CR tablet Take 1 tablet (36 mg total) by mouth every morning. 30 tablet 0   albuterol (VENTOLIN HFA) 108 (90 Base) MCG/ACT inhaler Inhale 1-2 puffs into the lungs every 4 (four) hours as needed for wheezing or shortness of breath. 18 g 1   clonazePAM (KLONOPIN) 0.5 MG tablet Take 1 tablet (0.5 mg total) by mouth 2 (two) times daily as needed for anxiety. 60 tablet 2   FLUoxetine (PROZAC) 40 MG capsule Take 1 capsule (40 mg total) by mouth daily. 30 capsule 2   methylphenidate (CONCERTA) 36 MG PO CR tablet Take 1 tablet (36 mg total) by mouth every morning. 30 tablet 0   methylphenidate (CONCERTA) 36 MG PO CR tablet Take 1 tablet (36 mg total) by mouth every morning. 30 tablet 0   traZODone (DESYREL) 50 MG tablet Take 1 tablet (50 mg total) by mouth at bedtime. 30 tablet 2   No current facility-administered medications for this visit.     Musculoskeletal: Strength & Muscle Tone: within normal limits Gait & Station: normal Patient leans: N/A  Psychiatric Specialty Exam: Review of Systems  All other systems reviewed and are negative.   There were no vitals taken for this visit.There is no height or weight on file to calculate BMI.  General Appearance: Casual and Fairly Groomed  Eye Contact:  Good  Speech:  Clear and Coherent  Volume:   Normal  Mood:  Euthymic  Affect:  Congruent  Thought Process:  Goal Directed  Orientation:  Full (Time, Place, and Person)  Thought Content: WDL   Suicidal Thoughts:  No  Homicidal Thoughts:  No  Memory:  Immediate;   Good Recent;   Good Remote;   NA  Judgement:  Good  Insight:  Fair  Psychomotor Activity:  Normal  Concentration:  Concentration: Good and Attention Span: Good  Recall:  Good  Fund of Knowledge: Good  Language: Good  Akathisia:  No  Handed:  Right  AIMS (if indicated): not done  Assets:  Communication Skills Desire for Improvement Physical Health Resilience Social Support  ADL's:  Intact  Cognition: Impaired,  Mild  Sleep:  Good   Screenings: GAD-7    Flowsheet Row Office Visit from 02/17/2023 in [facility] [place] Office Visit from 02/04/2023 in [facility] Video Visit from 03/11/2020 in [facility] Office Visit from 12/20/2019 in [facility] Office Visit from 11/22/2019 in [facility]  Total GAD-7 Score 21 14 5 18 14       PHQ2-9    Flowsheet Row Office Visit from 02/17/2023 in [facility] [place] Office Visit from 02/04/2023 in [facility] Video Visit from 11/16/2022 in [facility] [place] Office Visit from 06/17/2022 in [facility] Office Visit from 05/11/2022 in [facility]  PHQ-2 Total Score 2 3 3  0 0  PHQ-9 Total Score 15 14 9  -- --      Flowsheet Row Office Visit from 02/17/2023 in [facility] [place] ED from 02/05/2023 in [facility] Video Visit from 11/16/2022 in [facility] [place]  C-SSRS RISK CATEGORY No Risk No Risk No Risk        Assessment and Plan: This patient is a  27 year old female history of mild cognitive impairment, hearing loss depression and ADD.  She is doing better since we added the clonazepam 0.5 mg twice daily for anxiety so this will be continued.  She will also continue Prozac 40 mg daily for depression, trazodone 50 mg at bedtime for sleep and Concerta 36 mg every morning for ADD.  She will return to see me in 3 months  Collaboration of Care: Collaboration of Care: Primary Care Provider AEB notes are shared with PCP through the epic system  Patient/Guardian was advised Release of Information must be obtained prior to any record release in order to collaborate their care with an outside provider. Patient/Guardian was advised if they have not already done so to contact the registration department to sign all necessary forms in order for [place] to release information regarding their care.   Consent: Patient/Guardian gives verbal consent for treatment and assignment of benefits for services provided during this visit. Patient/Guardian expressed understanding and agreed to proceed.    Diannia Ruder, MD 04/20/2023, 11:16 AM

## 2023-06-06 ENCOUNTER — Ambulatory Visit: Payer: Medicaid Other | Admitting: Internal Medicine

## 2023-06-06 ENCOUNTER — Encounter: Payer: Self-pay | Admitting: Internal Medicine

## 2023-06-06 VITALS — BP 121/84 | HR 82 | Ht 60.0 in | Wt 178.2 lb

## 2023-06-06 DIAGNOSIS — E559 Vitamin D deficiency, unspecified: Secondary | ICD-10-CM | POA: Diagnosis not present

## 2023-06-06 DIAGNOSIS — R5383 Other fatigue: Secondary | ICD-10-CM | POA: Diagnosis not present

## 2023-06-06 DIAGNOSIS — F419 Anxiety disorder, unspecified: Secondary | ICD-10-CM | POA: Diagnosis not present

## 2023-06-06 DIAGNOSIS — F900 Attention-deficit hyperactivity disorder, predominantly inattentive type: Secondary | ICD-10-CM | POA: Diagnosis not present

## 2023-06-06 DIAGNOSIS — F32A Depression, unspecified: Secondary | ICD-10-CM | POA: Diagnosis not present

## 2023-06-06 NOTE — Assessment & Plan Note (Signed)
Her acute concern today is fatigue as described above.  I suspect underlying psychophysiologic etiology, but will exclude potential underlying metabolic etiologies.  Repeat labs have been ordered today.  In the absence of acute findings, consider increasing fluoxetine to 60 mg daily.

## 2023-06-06 NOTE — Patient Instructions (Signed)
It was a pleasure to see you today.  Thank you for giving [place] the opportunity to be involved in your care.  Below is a brief recap of your visit and next steps.  We will plan to see you again in 6 weeks.  Summary No medication changes for now Repeat labs ordered Follow up in 6 weeks through video visit

## 2023-06-06 NOTE — Progress Notes (Signed)
Established Patient Office Visit  Subjective   Patient ID: Lacey Hartman, female    DOB: 01-Sep-1996  Age: 27 y.o. MRN: [id number]  Chief Complaint  Patient presents with   Anxiety    Follow up    Lacey Hartman returns to care today for follow-up.  She was last evaluated by me on 4/19 for an acute visit.  Treated for acute bacterial sinusitis.  Presented to the ED the following day endorsing similar symptoms.  No additional treatment added at that time.  In the interim, she has been seen by psychiatry for follow-up.  There have otherwise been no acute interval events.  Lacey Hartman reports feeling poorly today.  She attributes this to family stress.  Her father-in-law is currently undergoing chemotherapy.  Her grandmother died last week.  She has lost multiple family members in recent years due to cancer.  This has caused her anxiety about her own cancer risk.  She states that she does not feel like getting out of bed most mornings and struggles to find motivation to complete daily activities.  Her family has noticed a changes recently as well.  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   Past Surgical History:  Procedure Laterality Date   NO PAST SURGERIES     Social History   Tobacco Use   Smoking status: Former    Current packs/day: 0.00    Average packs/day: 0.3 packs/day for 7.0 years (2.3 ttl pk-yrs)    Types: Cigarettes    Start date: 2011    Quit date: 2018    Years since quitting: 6.6    Passive exposure: Current   Smokeless tobacco: Never  Vaping Use   Vaping status: Never  Used  Substance Use Topics   Alcohol use: No    Alcohol/week: 0.0 standard drinks of alcohol   Drug use: No   Family History  Problem Relation Age of Onset   Depression Mother    Anxiety disorder Mother    Bipolar disorder Mother    Asthma Mother    Parkinson's disease Father    ADD / ADHD Brother    Breast cancer Brother    ADD / ADHD Brother    Asthma Brother    Allergic rhinitis Brother    Cancer Maternal Uncle        throat   Cancer Maternal Grandmother        breast cancer, lung cancer X 2, colon cancer. Three primary cancers   Cancer Paternal Grandmother        breast   Angioedema Neg Hx    Atopy Neg Hx    Eczema Neg Hx    Immunodeficiency Neg Hx    Urticaria Neg Hx    Allergies  Allergen Reactions   Codeine Hives, Swelling and Rash    & hallucinations/   Review of Systems  Constitutional:  Positive for malaise/fatigue.  Psychiatric/Behavioral:  Positive  for depression. The patient is nervous/anxious.      Objective:     BP 121/84 (BP Location: Right Arm, Patient Position: Sitting, Cuff Size: Normal)   Pulse 82   Ht 5' (1.524 m)   Wt 178 lb 3.2 oz (80.8 kg)   SpO2 98%   BMI 34.80 kg/m  BP Readings from Last 3 Encounters:  06/06/23 121/84  02/05/23 (!) 140/96  02/04/23 121/78   Physical Exam Constitutional:      General: She is not in acute distress.    Appearance: Normal appearance. She is not toxic-appearing.  HENT:     Head: Normocephalic and atraumatic.     Right Ear: External ear normal.     Left Ear: External ear normal.     Nose: Nose normal. No congestion or rhinorrhea.     Mouth/Throat:     Mouth: Mucous membranes are moist.     Pharynx: Oropharynx is clear. No oropharyngeal exudate or posterior oropharyngeal erythema.  Eyes:     General: No scleral icterus.    Extraocular Movements: Extraocular movements intact.     Conjunctiva/sclera: Conjunctivae normal.     Pupils: Pupils are equal, round, and reactive to light.   Cardiovascular:     Rate and Rhythm: Normal rate and regular rhythm.     Pulses: Normal pulses.     Heart sounds: Normal heart sounds. No murmur heard.    No friction rub. No gallop.  Pulmonary:     Effort: Pulmonary effort is normal.     Breath sounds: Normal breath sounds. No wheezing, rhonchi or rales.  Abdominal:     General: Abdomen is flat. Bowel sounds are normal. There is no distension.     Palpations: Abdomen is soft.     Tenderness: There is no abdominal tenderness.  Musculoskeletal:        General: No swelling. Normal range of motion.     Cervical back: Normal range of motion.     Right lower leg: No edema.     Left lower leg: No edema.  Lymphadenopathy:     Cervical: No cervical adenopathy.  Skin:    General: Skin is warm and dry.     Capillary Refill: Capillary refill takes less than 2 seconds.     Coloration: Skin is not jaundiced.  Neurological:     General: No focal deficit present.     Mental Status: She is alert and oriented to person, place, and time.  Psychiatric:        Mood and Affect: Mood normal.        Behavior: Behavior normal.   Last CBC Lab Results  Component Value Date   WBC 7.3 12/08/2021   HGB 12.9 12/08/2021   HCT 39.2 12/08/2021   MCV 92 12/08/2021   MCH 30.3 12/08/2021   RDW 12.5 12/08/2021   PLT 245 12/08/2021   Last metabolic panel Lab Results  Component Value Date   GLUCOSE 88 12/08/2021   NA 141 12/08/2021   K 4.1 12/08/2021   CL 105 12/08/2021   CO2 22 12/08/2021   BUN 9 12/08/2021   CREATININE 0.84 12/08/2021   EGFR 98 12/08/2021   CALCIUM 9.4 12/08/2021   PROT 6.9 12/08/2021   ALBUMIN 4.5 12/08/2021   LABGLOB 2.4 12/08/2021   AGRATIO 1.9 12/08/2021   BILITOT 0.4 12/08/2021   ALKPHOS 104 12/08/2021   AST 17 12/08/2021   ALT 24 12/08/2021   Last lipids Lab Results  Component Value Date  CHOL 141 12/08/2021   HDL 41 12/08/2021   LDLCALC 83 12/08/2021   TRIG 86 12/08/2021   CHOLHDL 3.4 12/08/2021   Last  hemoglobin A1c Lab Results  Component Value Date   HGBA1C 5.1 12/08/2021   Last thyroid functions Lab Results  Component Value Date   TSH 1.400 12/08/2021   T4TOTAL 9.7 07/29/2020   Last vitamin D Lab Results  Component Value Date   VD25OH 26.5 (L) 12/08/2021     Assessment & Plan:   Problem List Items Addressed This Visit       Anxiety and depression - Primary    Followed by psychiatry (Dr. Tenny Craw).  She is currently prescribed fluoxetine 40 mg daily and clonazepam 0.5 mg twice daily as needed for anxiety relief.  Today she describes worsening fatigue and states that she does not want to get out of bed most mornings.  She has a lot of stress related to caring for her father-in-law.  Multiple family members have died in recent years as well.  I suspect that her current symptoms are related to depression, but will first exclude any underlying metabolic etiologies. -Repeat labs have been ordered today.  Pending lab results, consider increasing fluoxetine to 60 mg daily      Fatigue    Her acute concern today is fatigue as described above.  I suspect underlying psychophysiologic etiology, but will exclude potential underlying metabolic etiologies.  Repeat labs have been ordered today.  In the absence of acute findings, consider increasing fluoxetine to 60 mg daily.       Return in about 6 weeks (around 07/18/2023) for anxiety / depression.    Billie Lade, MD

## 2023-06-06 NOTE — Assessment & Plan Note (Signed)
Followed by psychiatry (Dr. Tenny Craw).  She is currently prescribed fluoxetine 40 mg daily and clonazepam 0.5 mg twice daily as needed for anxiety relief.  Today she describes worsening fatigue and states that she does not want to get out of bed most mornings.  She has a lot of stress related to caring for her father-in-law.  Multiple family members have died in recent years as well.  I suspect that her current symptoms are related to depression, but will first exclude any underlying metabolic etiologies. -Repeat labs have been ordered today.  Pending lab results, consider increasing fluoxetine to 60 mg daily

## 2023-06-07 ENCOUNTER — Other Ambulatory Visit: Payer: Self-pay | Admitting: Internal Medicine

## 2023-06-07 DIAGNOSIS — F419 Anxiety disorder, unspecified: Secondary | ICD-10-CM

## 2023-06-07 LAB — CBC WITH DIFFERENTIAL/PLATELET
Basophils Absolute: 0 10*3/uL (ref 0.0–0.2)
Basos: 0 %
EOS (ABSOLUTE): 0.1 10*3/uL (ref 0.0–0.4)
Eos: 1 %
Hematocrit: 37.9 % (ref 34.0–46.6)
Hemoglobin: 12.5 g/dL (ref 11.1–15.9)
Immature Grans (Abs): 0 10*3/uL (ref 0.0–0.1)
Immature Granulocytes: 0 %
Lymphocytes Absolute: 1.9 10*3/uL (ref 0.7–3.1)
Lymphs: 27 %
MCH: 30 pg (ref 26.6–33.0)
MCHC: 33 g/dL (ref 31.5–35.7)
MCV: 91 fL (ref 79–97)
Monocytes Absolute: 0.4 10*3/uL (ref 0.1–0.9)
Monocytes: 6 %
Neutrophils Absolute: 4.9 10*3/uL (ref 1.4–7.0)
Neutrophils: 66 %
Platelets: 236 10*3/uL (ref 150–450)
RBC: 4.16 x10E6/uL (ref 3.77–5.28)
RDW: 12.4 % (ref 11.7–15.4)
WBC: 7.3 10*3/uL (ref 3.4–10.8)

## 2023-06-07 LAB — CMP14+EGFR
ALT: 15 IU/L (ref 0–32)
AST: 16 IU/L (ref 0–40)
Albumin: 4.4 g/dL (ref 4.0–5.0)
Alkaline Phosphatase: 88 IU/L (ref 44–121)
BUN/Creatinine Ratio: 12 (ref 9–23)
BUN: 10 mg/dL (ref 6–20)
Bilirubin Total: 0.3 mg/dL (ref 0.0–1.2)
CO2: 23 mmol/L (ref 20–29)
Calcium: 9.4 mg/dL (ref 8.7–10.2)
Chloride: 107 mmol/L — ABNORMAL HIGH (ref 96–106)
Creatinine, Ser: 0.86 mg/dL (ref 0.57–1.00)
Globulin, Total: 2.4 g/dL (ref 1.5–4.5)
Glucose: 92 mg/dL (ref 70–99)
Potassium: 4.4 mmol/L (ref 3.5–5.2)
Sodium: 141 mmol/L (ref 134–144)
Total Protein: 6.8 g/dL (ref 6.0–8.5)
eGFR: 94 mL/min/{1.73_m2} (ref >59)

## 2023-06-07 LAB — B12 AND FOLATE PANEL
Folate: 3.8 ng/mL (ref >3.0)
Vitamin B-12: 430 pg/mL (ref 232–1245)

## 2023-06-07 LAB — TSH+FREE T4
Free T4: 1.22 ng/dL (ref 0.82–1.77)
TSH: 1.14 u[IU]/mL (ref 0.450–4.500)

## 2023-06-07 LAB — HEMOGLOBIN A1C
Est. average glucose Bld gHb Est-mCnc: 105 mg/dL
Hgb A1c MFr Bld: 5.3 % (ref 4.8–5.6)

## 2023-06-07 LAB — IRON,TIBC AND FERRITIN PANEL
Ferritin: 21 ng/mL (ref 15–150)
Iron Saturation: 13 % — ABNORMAL LOW (ref 15–55)
Iron: 47 ug/dL (ref 27–159)
Total Iron Binding Capacity: 354 ug/dL (ref 250–450)
UIBC: 307 ug/dL (ref 131–425)

## 2023-06-07 LAB — VITAMIN D 25 HYDROXY (VIT D DEFICIENCY, FRACTURES): Vit D, 25-Hydroxy: 33 ng/mL (ref 30.0–100.0)

## 2023-06-07 MED ORDER — FLUOXETINE HCL 20 MG PO CAPS: 20.0000 mg | ORAL_CAPSULE | Freq: Every day | ORAL | 2 refills | Status: DC

## 2023-06-07 MED ORDER — FLUOXETINE HCL 40 MG PO CAPS
40.0000 mg | ORAL_CAPSULE | Freq: Every day | ORAL | 2 refills | Status: DC
Start: 2023-06-07 — End: 2023-07-28

## 2023-07-05 ENCOUNTER — Telehealth (HOSPITAL_COMMUNITY): Payer: Self-pay

## 2023-07-05 NOTE — Telephone Encounter (Signed)
Erroneous entry

## 2023-07-28 ENCOUNTER — Telehealth (INDEPENDENT_AMBULATORY_CARE_PROVIDER_SITE_OTHER): Payer: Medicaid Other | Admitting: Psychiatry

## 2023-07-28 ENCOUNTER — Encounter (HOSPITAL_COMMUNITY): Payer: Self-pay | Admitting: Psychiatry

## 2023-07-28 DIAGNOSIS — F900 Attention-deficit hyperactivity disorder, predominantly inattentive type: Secondary | ICD-10-CM | POA: Diagnosis not present

## 2023-07-28 DIAGNOSIS — F419 Anxiety disorder, unspecified: Secondary | ICD-10-CM

## 2023-07-28 DIAGNOSIS — F331 Major depressive disorder, recurrent, moderate: Secondary | ICD-10-CM

## 2023-07-28 MED ORDER — FLUOXETINE HCL 40 MG PO CAPS
40.0000 mg | ORAL_CAPSULE | Freq: Every day | ORAL | 2 refills | Status: DC
Start: 2023-07-28 — End: 2024-04-05

## 2023-07-28 MED ORDER — METHYLPHENIDATE HCL ER (OSM) 36 MG PO TBCR: 36.0000 mg | EXTENDED_RELEASE_TABLET | ORAL | 0 refills | Status: DC

## 2023-07-28 MED ORDER — TRAZODONE HCL 50 MG PO TABS: 50.0000 mg | ORAL_TABLET | Freq: Every day | ORAL | 2 refills | Status: DC

## 2023-07-28 MED ORDER — FLUOXETINE HCL 20 MG PO CAPS
20.0000 mg | ORAL_CAPSULE | Freq: Every day | ORAL | 2 refills | Status: DC
Start: 2023-07-28 — End: 2024-04-05

## 2023-07-28 MED ORDER — CLONAZEPAM 0.5 MG PO TABS: 0.5000 mg | ORAL_TABLET | Freq: Two times a day (BID) | ORAL | 2 refills | Status: DC | PRN

## 2023-07-28 NOTE — Progress Notes (Signed)
Virtual Visit via Video Note  I connected with Lacey Hartman on 07/28/23 at  3:00 PM EDT by a video enabled telemedicine application and verified that I am speaking with the correct person using two identifiers.  Location: Patient: home Provider: office   I discussed the limitations of evaluation and management by telemedicine and the availability of in person appointments. The patient expressed understanding and agreed to proceed.     I discussed the assessment and treatment plan with the patient. The patient was provided an opportunity to ask questions and all were answered. The patient agreed with the plan and demonstrated an understanding of the instructions.   The patient was advised to call back or seek an in-person evaluation if the symptoms worsen or if the condition fails to improve as anticipated.  I provided 15 minutes of non-face-to-face time during this encounter.   Diannia Ruder, MD  [facility] MD/PA/NP OP Progress Note  07/28/2023 3:27 PM Lacey Hartman  MRN:  [id number]  Chief Complaint:  Chief Complaint  Patient presents with   Anxiety   Depression   ADD   Follow-up   HPI: This patient is a 27 year old white female who lives with her boyfriend 2 daughters and 1 son in [place]. She is on disability and a full-time homemaker.   The patient returns for follow-up after 3 months regarding her depression anxiety and ADHD.  She states that her grandfather died in 06-11-2023 and this caused her to be very depressed and anxious.  Apparently they were quite close.  She saw her primary doctor around this time and he increased her Prozac to 60 mg daily.  She states that she is doing much better now and is no longer as depressed or anxious.  She is sleeping well.  She has no thoughts of self-harm or suicide and is focusing well with the Concerta.  She would like to stay on her current regimen. Visit Diagnosis:    ICD-10-CM   1. Moderate episode of recurrent major depressive disorder  (HCC)  F33.1     2. Anxiety and depression  F41.9 FLUoxetine (PROZAC) 40 MG capsule   F32.A FLUoxetine (PROZAC) 20 MG capsule    3. 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 Three primary cancers   Cancer Paternal Grandmother        breast   Angioedema Neg Hx    Atopy Neg Hx    Eczema Neg Hx    Immunodeficiency Neg Hx    Urticaria Neg Hx     Social History:  Social History   Socioeconomic History   Marital status: Significant Other    Spouse name: Molly Maduro   Number of children: 3   Years of education: Not on file   Highest education level: 9th grade  Occupational History   Not on file  Tobacco Use   Smoking status: Former    Current packs/day: 0.00    Average packs/day: 0.3 packs/day for 7.0 years (2.3 ttl pk-yrs)    Types: Cigarettes    Start date: 2011    Quit date: 2018     Years since quitting: 6.7    Passive exposure: Current   Smokeless tobacco: Never  Vaping Use   Vaping status: Never Used  Substance and Sexual Activity   Alcohol use: No    Alcohol/week: 0.0 standard drinks of alcohol   Drug use: No   Sexual activity: Yes    Birth control/protection: Pill  Other Topics Concern   Not on file  Social History Narrative   Lives with Molly Maduro and 3 children   [name]   Nelda Marseille      Enjoys: playing with kids, going outside, beach, crafts      Diet: does not eat a lot of veggies or fruits, a lot of junk    Caffeine: 15 sodas   Water: 4-5 cups daily      Wears seat belt    Smoke detectors   Does not use phone while driving    Social Determinants of Health   Financial Resource Strain: Low Risk  (11/22/2019)   Overall Financial Resource Strain (CARDIA)    Difficulty of Paying Living Expenses: Not hard at all  Food Insecurity: No Food Insecurity (11/22/2019)   Hunger Vital Sign    Worried About Running Out of Food in the Last Year: Never true    Ran Out of Food in the Last Year: Never true  Transportation Needs: No Transportation Needs (11/22/2019)   PRAPARE - Administrator, Civil Service (Medical): No    Lack of Transportation (Non-Medical): No  Physical Activity: Inactive (11/22/2019)   Exercise Vital Sign    Days of Exercise per Week: 0 days    Minutes of Exercise per Session: 0 min  Stress: Stress Concern Present (11/22/2019)   [employer] of Occupational Health - Occupational Stress Questionnaire    Feeling of Stress : To some extent  Social Connections: Moderately Isolated (11/22/2019)   Social Connection and Isolation Panel [NHANES]    Frequency of Communication with Friends and Family: More than three times a week    Frequency of Social Gatherings with Friends and Family: More than three times a week    Attends Religious Services: Never    Database administrator or Organizations: No    Attends Banker  Meetings: Never    Marital Status: Living with partner    Allergies:  Allergies  Allergen Reactions   Codeine Hives, Swelling and Rash    &  hallucinations/    Metabolic Disorder Labs: Lab Results  Component Value Date   HGBA1C 5.3 06/06/2023   No results found for: "PROLACTIN" Lab Results  Component Value Date   CHOL 141 12/08/2021   TRIG 86 12/08/2021   HDL 41 12/08/2021   CHOLHDL 3.4 12/08/2021   LDLCALC 83 12/08/2021   LDLCALC 89 07/24/2019   Lab Results  Component Value Date   TSH 1.140 06/06/2023   TSH 1.400 12/08/2021    Therapeutic Level Labs: No results found for: "LITHIUM" No results found for: "VALPROATE" No results found for: "CBMZ"  Current Medications: Current Outpatient Medications  Medication Sig Dispense Refill   albuterol (VENTOLIN HFA) 108 (90 Base) MCG/ACT inhaler Inhale 1-2 puffs into the lungs every 4 (four) hours as needed for wheezing or shortness of breath. 18 g 1   clonazePAM (KLONOPIN) 0.5 MG tablet Take 1 tablet (0.5 mg total) by mouth 2 (two) times daily as needed for anxiety. 60 tablet 2   FLUoxetine (PROZAC) 20 MG capsule Take 1 capsule (20 mg total) by mouth daily. 30 capsule 2   FLUoxetine (PROZAC) 40 MG capsule Take 1 capsule (40 mg total) by mouth daily. 30 capsule 2   methylphenidate (CONCERTA) 36 MG PO CR tablet Take 1 tablet (36 mg total) by mouth every morning. 30 tablet 0   methylphenidate (CONCERTA) 36 MG PO CR tablet Take 1 tablet (36 mg total) by mouth every morning. 30 tablet 0   methylphenidate (CONCERTA) 36 MG PO CR tablet Take 1 tablet (36 mg total) by mouth every morning. 30 tablet 0   traZODone (DESYREL) 50 MG tablet Take 1 tablet (50 mg total) by mouth at bedtime. 30 tablet 2   No current facility-administered medications for this visit.     Musculoskeletal: Strength & Muscle Tone: within normal limits Gait & Station: normal Patient leans: N/A  Psychiatric Specialty Exam: Review of Systems  HENT:  Positive  for hearing loss.   All other systems reviewed and are negative.   There were no vitals taken for this visit.There is no height or weight on file to calculate BMI.  General Appearance: Casual and Fairly Groomed  Eye Contact:  Good  Speech:  Clear and Coherent  Volume:  Normal  Mood:  Euthymic  Affect:  Congruent  Thought Process:  Goal Directed  Orientation:  Full (Time, Place, and Person)  Thought Content: WDL   Suicidal Thoughts:  No  Homicidal Thoughts:  No  Memory:  Immediate;   Good Recent;   Good Remote;   Fair  Judgement:  Good  Insight:  Fair  Psychomotor Activity:  Normal  Concentration:  Concentration: Good and Attention Span: Good  Recall:  Good  Fund of Knowledge: Fair  Language: Good  Akathisia:  No  Handed:  Right  AIMS (if indicated): not done  Assets:  Communication Skills Desire for Improvement Physical Health Resilience Social Support  ADL's:  Intact  Cognition: Impaired,  Mild  Sleep:  Good   Screenings: GAD-7    Flowsheet Row Office Visit from 06/06/2023 in [facility] Office Visit from 02/17/2023 in [facility] at [place] Office Visit from 02/04/2023 in [facility] Video Visit from 03/11/2020 in [facility] Care Office Visit from 12/20/2019 in [facility] Primary Care  Total GAD-7 Score 21 21 14 5 18       PHQ2-9    Flowsheet Row Office Visit from 06/06/2023 in [facility]  [place] [facility] Office Visit from 02/17/2023 in [facility] at [place] Office Visit from 02/04/2023 in [facility] Care Video Visit from 11/16/2022 in [facility] Visit from 06/17/2022 in [facility] Primary Care  PHQ-2 Total Score 2 2 3 3  0  PHQ-9 Total Score 15 15 14 9  --      Flowsheet Row Office Visit from 02/17/2023 in [facility]  at [place] ED from 02/05/2023 in [facility] Emergency Department at [facility] Video Visit from 11/16/2022 in [facility] at [place]  C-SSRS RISK CATEGORY No Risk No Risk No Risk        Assessment and Plan: This patient is a 27 year old female with a history of mild cognitive impairment hearing loss depression and ADD.  She seems to be doing better since her Prozac was increased to 60 mg daily so this will be continued for depression.  She will continue trazodone 50 mg at bedtime for sleep and Concerta 36 mg every morning for ADD as well as clonazepam 0.5 mg twice daily for anxiety.  She will return to see me in 3 months  Collaboration of Care: Collaboration of Care: Primary Care Provider AEB notes are shared with PCP through the epic system  Patient/Guardian was advised Release of Information must be obtained prior to any record release in order to collaborate their care with an outside provider. Patient/Guardian was advised if they have not already done so to contact the registration department to sign all necessary forms in order for [place] to release information regarding their care.   Consent: Patient/Guardian gives verbal consent for treatment and assignment of benefits for services provided during this visit. Patient/Guardian expressed understanding and agreed to proceed.    Diannia Ruder, MD 07/28/2023, 3:27 PM

## 2023-07-29 ENCOUNTER — Telehealth: Payer: Medicaid Other | Admitting: Internal Medicine

## 2023-08-10 DIAGNOSIS — H6092 Unspecified otitis externa, left ear: Secondary | ICD-10-CM | POA: Diagnosis not present

## 2023-08-10 DIAGNOSIS — H6123 Impacted cerumen, bilateral: Secondary | ICD-10-CM | POA: Diagnosis not present

## 2023-08-10 DIAGNOSIS — H6091 Unspecified otitis externa, right ear: Secondary | ICD-10-CM | POA: Diagnosis not present

## 2023-08-10 DIAGNOSIS — H6503 Acute serous otitis media, bilateral: Secondary | ICD-10-CM | POA: Diagnosis not present

## 2023-08-10 DIAGNOSIS — J302 Other seasonal allergic rhinitis: Secondary | ICD-10-CM | POA: Diagnosis not present

## 2023-08-10 DIAGNOSIS — H6692 Otitis media, unspecified, left ear: Secondary | ICD-10-CM | POA: Diagnosis not present

## 2023-08-11 ENCOUNTER — Encounter: Payer: Self-pay | Admitting: Internal Medicine

## 2023-08-11 ENCOUNTER — Telehealth (INDEPENDENT_AMBULATORY_CARE_PROVIDER_SITE_OTHER): Payer: Medicaid Other | Admitting: Internal Medicine

## 2023-08-11 DIAGNOSIS — F32A Depression, unspecified: Secondary | ICD-10-CM

## 2023-08-11 DIAGNOSIS — R110 Nausea: Secondary | ICD-10-CM

## 2023-08-11 DIAGNOSIS — F419 Anxiety disorder, unspecified: Secondary | ICD-10-CM | POA: Diagnosis not present

## 2023-08-11 MED ORDER — ONDANSETRON HCL 8 MG PO TABS
8.0000 mg | ORAL_TABLET | Freq: Three times a day (TID) | ORAL | 1 refills | Status: AC | PRN
Start: 2023-08-11 — End: ?

## 2023-08-11 NOTE — Assessment & Plan Note (Signed)
She is currently pregnant and experiencing morning sickness.  Zofran refilled at patient's request.

## 2023-08-11 NOTE — Progress Notes (Signed)
Virtual Visit via Video Note  I connected with Lacey Hartman on 08/11/23 at 11:20 AM EDT by a video enabled telemedicine application and verified that I am speaking with the correct person using two identifiers.  Patient Location: Home Provider Location: Office/Clinic  I discussed the limitations, risks, security, and privacy concerns of performing an evaluation and management service by video and the availability of in person appointments. I also discussed with the patient that there may be a patient responsible charge related to this service. The patient expressed understanding and agreed to proceed.  Subjective: PCP: Billie Lade, MD  Chief Complaint  Patient presents with   Depression   Anxiety   Follow-up   Lacey Hartman has been evaluated today through video encounter for follow-up of anxiety and depression.  She was last evaluated by me on 8/19 at which time she endorsed poorly controlled symptoms of anxiety as well as fatigue.  She stated she was under a lot of stress due to her father-in-law undergoing chemotherapy and her grandmother recently passing.  Repeat labs were ordered and ultimately fluoxetine was increased to 60 mg daily.  In the interim, she has been evaluated by psychiatry (Dr. Tenny Craw) for follow-up.  No additional medication changes were made.  Today Lacey Hartman states that she feels well.  Her mood is stable and anxiety adequately controlled with fluoxetine 60 mg daily and clonazepam 0.5 mg twice daily.  Her additional concern is requesting a refill of Zofran for as needed nausea relief.  She is currently pregnant and is experiencing morning sickness.   ROS: Per HPI  Current Outpatient Medications:    albuterol (VENTOLIN HFA) 108 (90 Base) MCG/ACT inhaler, Inhale 1-2 puffs into the lungs every 4 (four) hours as needed for wheezing or shortness of breath., Disp: 18 g, Rfl: 1   clonazePAM (KLONOPIN) 0.5 MG tablet, Take 1 tablet (0.5 mg total) by mouth 2 (two) times  daily as needed for anxiety., Disp: 60 tablet, Rfl: 2   FLUoxetine (PROZAC) 20 MG capsule, Take 1 capsule (20 mg total) by mouth daily., Disp: 30 capsule, Rfl: 2   FLUoxetine (PROZAC) 40 MG capsule, Take 1 capsule (40 mg total) by mouth daily., Disp: 30 capsule, Rfl: 2   methylphenidate (CONCERTA) 36 MG PO CR tablet, Take 1 tablet (36 mg total) by mouth every morning., Disp: 30 tablet, Rfl: 0   methylphenidate (CONCERTA) 36 MG PO CR tablet, Take 1 tablet (36 mg total) by mouth every morning., Disp: 30 tablet, Rfl: 0   methylphenidate (CONCERTA) 36 MG PO CR tablet, Take 1 tablet (36 mg total) by mouth every morning., Disp: 30 tablet, Rfl: 0   ondansetron (ZOFRAN) 8 MG tablet, Take 1 tablet (8 mg total) by mouth every 8 (eight) hours as needed for nausea or vomiting., Disp: 20 tablet, Rfl: 1   traZODone (DESYREL) 50 MG tablet, Take 1 tablet (50 mg total) by mouth at bedtime., Disp: 30 tablet, Rfl: 2  Assessment and Plan:  Nausea without vomiting Assessment & Plan: She is currently pregnant and experiencing morning sickness.  Zofran refilled at patient's request.  Anxiety and depression Assessment & Plan: Evaluated today for follow-up of anxiety and depression.  Fluoxetine was increased to 60 mg daily in August.  She is additionally prescribed clonazepam 0.5 mg twice daily as needed for anxiety relief.  Reports today that her mood is stable and anxiety adequately controlled on this regimen.  She is not interested in making any additional medication adjustments at  this time.  Follow Up Instructions: Return in about 3 months (around 11/11/2023).   I discussed the assessment and treatment plan with the patient. The patient was provided an opportunity to ask questions, and all were answered. The patient agreed with the plan and demonstrated an understanding of the instructions.   The patient was advised to call back or seek an in-person evaluation if the symptoms worsen or if the condition fails to  improve as anticipated.  The above assessment and management plan was discussed with the patient. The patient verbalized understanding of and has agreed to the management plan.   Billie Lade, MD

## 2023-08-11 NOTE — Assessment & Plan Note (Signed)
Evaluated today for follow-up of anxiety and depression.  Fluoxetine was increased to 60 mg daily in August.  She is additionally prescribed clonazepam 0.5 mg twice daily as needed for anxiety relief.  Reports today that her mood is stable and anxiety adequately controlled on this regimen.  She is not interested in making any additional medication adjustments at this time.

## 2023-08-24 DIAGNOSIS — O219 Vomiting of pregnancy, unspecified: Secondary | ICD-10-CM | POA: Diagnosis not present

## 2023-08-24 DIAGNOSIS — Z3A08 8 weeks gestation of pregnancy: Secondary | ICD-10-CM | POA: Diagnosis not present

## 2023-08-24 DIAGNOSIS — O99210 Obesity complicating pregnancy, unspecified trimester: Secondary | ICD-10-CM | POA: Diagnosis not present

## 2023-08-24 DIAGNOSIS — Z3201 Encounter for pregnancy test, result positive: Secondary | ICD-10-CM | POA: Diagnosis not present

## 2023-08-24 DIAGNOSIS — O3680X0 Pregnancy with inconclusive fetal viability, not applicable or unspecified: Secondary | ICD-10-CM | POA: Diagnosis not present

## 2023-08-24 DIAGNOSIS — Z8632 Personal history of gestational diabetes: Secondary | ICD-10-CM | POA: Diagnosis not present

## 2023-08-24 DIAGNOSIS — N912 Amenorrhea, unspecified: Secondary | ICD-10-CM | POA: Diagnosis not present

## 2023-08-24 DIAGNOSIS — Z3689 Encounter for other specified antenatal screening: Secondary | ICD-10-CM | POA: Diagnosis not present

## 2023-08-24 DIAGNOSIS — O09299 Supervision of pregnancy with other poor reproductive or obstetric history, unspecified trimester: Secondary | ICD-10-CM | POA: Diagnosis not present

## 2023-08-25 DIAGNOSIS — N912 Amenorrhea, unspecified: Secondary | ICD-10-CM | POA: Diagnosis not present

## 2023-08-25 DIAGNOSIS — Z8632 Personal history of gestational diabetes: Secondary | ICD-10-CM | POA: Diagnosis not present

## 2023-08-25 DIAGNOSIS — O09299 Supervision of pregnancy with other poor reproductive or obstetric history, unspecified trimester: Secondary | ICD-10-CM | POA: Diagnosis not present

## 2023-08-25 DIAGNOSIS — Z3201 Encounter for pregnancy test, result positive: Secondary | ICD-10-CM | POA: Diagnosis not present

## 2023-08-25 DIAGNOSIS — Z3689 Encounter for other specified antenatal screening: Secondary | ICD-10-CM | POA: Diagnosis not present

## 2023-09-23 DIAGNOSIS — Z8632 Personal history of gestational diabetes: Secondary | ICD-10-CM | POA: Diagnosis not present

## 2023-09-23 DIAGNOSIS — O09299 Supervision of pregnancy with other poor reproductive or obstetric history, unspecified trimester: Secondary | ICD-10-CM | POA: Diagnosis not present

## 2023-09-23 DIAGNOSIS — Z3689 Encounter for other specified antenatal screening: Secondary | ICD-10-CM | POA: Diagnosis not present

## 2023-10-04 ENCOUNTER — Emergency Department (HOSPITAL_COMMUNITY)
Admission: EM | Admit: 2023-10-04 | Discharge: 2023-10-05 | Disposition: A | Discharge status: Home / Self Care | Payer: Medicaid Other | Attending: Emergency Medicine | Admitting: Emergency Medicine

## 2023-10-04 ENCOUNTER — Other Ambulatory Visit: Payer: Self-pay

## 2023-10-04 ENCOUNTER — Encounter (HOSPITAL_COMMUNITY): Payer: Self-pay

## 2023-10-04 DIAGNOSIS — Z87891 Personal history of nicotine dependence: Secondary | ICD-10-CM | POA: Insufficient documentation

## 2023-10-04 DIAGNOSIS — H9202 Otalgia, left ear: Secondary | ICD-10-CM | POA: Diagnosis not present

## 2023-10-04 DIAGNOSIS — J45909 Unspecified asthma, uncomplicated: Secondary | ICD-10-CM | POA: Diagnosis not present

## 2023-10-04 NOTE — ED Triage Notes (Signed)
Pt c/o L sided ear pain since this afternoon. Pt with hx of ear infection.

## 2023-10-05 MED ORDER — AMOXICILLIN 500 MG PO CAPS: 500.0000 mg | ORAL_CAPSULE | Freq: Three times a day (TID) | ORAL | 0 refills | Status: AC

## 2023-10-05 MED ORDER — AMOXICILLIN 250 MG PO CAPS
500.0000 mg | ORAL_CAPSULE | Freq: Once | ORAL | Status: AC
  Administered 2023-10-05: 500 mg via ORAL
  Filled 2023-10-05: qty 2

## 2023-10-05 NOTE — ED Notes (Signed)
Patient verbalizes understanding of discharge instructions. Opportunity for questioning and answers were provided. Armband removed by staff, pt discharged from ED. Ambulated out to lobby with boyfriend  

## 2023-10-05 NOTE — ED Provider Notes (Signed)
[facility]-EMERGENCY DEPT [facility] Emergency Department Provider Note MRN:  [id number]  Arrival date & time: 10/05/23     Chief Complaint   Otalgia   History of Present Illness   Lacey Hartman is a 27 y.o. year-old female with no pertinent past medical history presenting to the ED with chief complaint of otalgia.  Left ear pain starting today, not going away.  Feels similar to prior ear infections in the past.  Denies fever, no other complaints.  Review of Systems  A thorough review of systems was obtained and all systems are negative except as noted in the HPI and PMH.   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 Three primary cancers   Cancer Paternal Grandmother        breast   Angioedema Neg Hx    Atopy Neg Hx    Eczema Neg Hx    Immunodeficiency Neg Hx    Urticaria Neg Hx     Social History   Socioeconomic History   Marital status: Significant Other    Spouse name: Molly Maduro   Number of children: 3   Years of education: Not on file   Highest education  level: 9th grade  Occupational History   Not on file  Tobacco Use   Smoking status: Former    Current packs/day: 0.00    Average packs/day: 0.3 packs/day for 7.0 years (2.3 ttl pk-yrs)    Types: Cigarettes    Start date: 2011    Quit date: 2018    Years since quitting: 6.9    Passive exposure: Current   Smokeless tobacco: Never  Vaping Use   Vaping status: Never Used  Substance and Sexual Activity   Alcohol use: No    Alcohol/week: 0.0 standard drinks of alcohol   Drug use: No   Sexual activity: Yes    Birth control/protection: Pill  Other Topics Concern   Not on file  Social History Narrative   Lives with Molly Maduro and 3 children   [name]   Nelda [place]se[place]      Enjoys: playing with kids, going outside, beach, crafts      Diet: does not eat a lot  of veggies or fruits, a lot of junk    Caffeine: 15 sodas   Water: 4-5 cups daily      Wears seat belt    Smoke detectors   Does not use phone while driving    Social Drivers of Corporate investment banker Strain: Low Risk  (11/22/2019)   Overall Financial Resource Strain (CARDIA)    Difficulty of Paying Living Expenses: Not hard at all  Food Insecurity: No Food Insecurity (11/22/2019)   Hunger Vital Sign    Worried About Running Out of Food in the Last Year: Never true    Ran Out of Food in the Last Year: Never true  Transportation Needs: No Transportation Needs (11/22/2019)   PRAPARE - Administrator, Civil Service (Medical): No    Lack of Transportation (Non-Medical): No  Physical Activity: Inactive (11/22/2019)   Exercise Vital Sign    Days of Exercise per Week: 0 days    Minutes of Exercise per Session: 0 min  Stress: Stress Concern Present (11/22/2019)   [employer] of Occupational Health - Occupational Stress Questionnaire    Feeling of Stress : To some extent  Social Connections: Moderately Isolated (11/22/2019)   Social Connection and Isolation Panel [NHANES]    Frequency of Communication with Friends  and Family: More than three times a week    Frequency of Social Gatherings with Friends and Family: More than three times a week    Attends Religious Services: Never    Database administrator or Organizations: No    Attends Banker Meetings: Never    Marital Status: Living with partner  Intimate Partner Violence: Not At Risk (08/24/2023)   Received from [facility]   Humiliation, Afraid, Rape, and Kick questionnaire    Fear of Current or Ex-Partner: No    Emotionally Abused: No    Physically Abused: No    Sexually Abused: No     Physical Exam   Vitals:   10/04/23 2141  BP: 125/68  Pulse: 72  Resp: 17  Temp: 98.3 F (36.8 C)  SpO2: 100%    CONSTITUTIONAL: Well-appearing, NAD NEURO/PSYCH:  Alert and oriented x 3, no focal deficits EYES:  eyes equal and reactive ENT/NECK:  no LAD, no JVD CARDIO: Regular rate, well-perfused, normal S1 and S2 PULM:  CTAB no wheezing or rhonchi GI/GU:  non-distended, non-tender MSK/SPINE:  No gross deformities, no edema SKIN:  no rash, atraumatic   *Additional and/or pertinent findings included in MDM below  Diagnostic and Interventional Summary    EKG Interpretation Date/Time:    Ventricular Rate:    PR Interval:    QRS Duration:    QT Interval:    QTC Calculation:   R Axis:      Text Interpretation:         Labs Reviewed - No data to display  No orders to display    Medications  amoxicillin (AMOXIL) capsule 500 mg (500 mg Oral Given 10/05/23 0028)     Procedures  /  Critical Care Procedures  ED Course and Medical Decision Making  Initial Impression and Ddx Some erythema to the left TM could suggest otitis media.  Otherwise no symptoms, normal vitals.  Past medical/surgical history that increases complexity of ED encounter: None  Interpretation of Diagnostics Laboratory and/or imaging options to aid in the diagnosis/care of the patient were considered.  After careful history and physical examination,  it was determined that there was no indication  for diagnostics at this time.  Patient Reassessment and Ultimate Disposition/Management     Discharge  Patient management required discussion with the following services or consulting groups:  None  Complexity of Problems Addressed Acute complicated illness or Injury  Additional Data Reviewed and Analyzed Further history obtained from: Past medical history and medications listed in the EMR  Additional Factors Impacting ED Encounter Risk Prescriptions  Elmer Sow. Pilar Plate, MD [facility] Emergency Medicine [facility] [email or web address]  Final Clinical Impressions(s) / ED Diagnoses     ICD-10-CM   1. Left ear pain  H92.02       ED Discharge Orders          Ordered    amoxicillin (AMOXIL) 500 MG capsule  3 times daily        10/05/23 0029             Discharge Instructions Discussed with and Provided to Patient:    Discharge Instructions      You were evaluated in the Emergency Department and after careful evaluation, we did not find any emergent condition requiring admission or further testing in the hospital.  Your exam/testing today is overall reassuring.  Pain may be due to an ear infection.  Take the amoxicillin antibiotic as directed.  Please return to the Emergency Department if you experience any worsening of your condition.   Thank you for allowing [place] to be a part of your care.      Sabas Sous, MD 10/05/23 [place]

## 2023-10-05 NOTE — Discharge Instructions (Addendum)
You were evaluated in the Emergency Department and after careful evaluation, we did not find any emergent condition requiring admission or further testing in the hospital.  Your exam/testing today is overall reassuring.  Pain may be due to an ear infection.  Take the amoxicillin antibiotic as directed.  Please return to the Emergency Department if you experience any worsening of your condition.   Thank you for allowing [place] to be a part of your care.

## 2023-10-20 ENCOUNTER — Telehealth: Payer: Self-pay | Admitting: *Deleted

## 2023-10-20 NOTE — Progress Notes (Signed)
 Transition Care Management Unsuccessful Follow-up Telephone Call  Date of discharge and from where:  [facility] 10/05/2023  Attempts:  1st Attempt  Reason for unsuccessful TCM follow-up call:  Left voice message

## 2023-10-24 ENCOUNTER — Telehealth: Payer: Self-pay | Admitting: *Deleted

## 2023-10-24 NOTE — Progress Notes (Signed)
 Transition Care Management Unsuccessful Follow-up Telephone Call  Date of discharge and from where:  [facility] 10/05/2023  Attempts:  2nd Attempt  Reason for unsuccessful TCM follow-up call:  Left voice message

## 2023-11-01 DIAGNOSIS — Z3A18 18 weeks gestation of pregnancy: Secondary | ICD-10-CM | POA: Diagnosis not present

## 2023-11-01 DIAGNOSIS — Z3689 Encounter for other specified antenatal screening: Secondary | ICD-10-CM | POA: Diagnosis not present

## 2023-11-30 ENCOUNTER — Ambulatory Visit: Payer: Self-pay | Admitting: Internal Medicine

## 2023-11-30 DIAGNOSIS — R059 Cough, unspecified: Secondary | ICD-10-CM | POA: Diagnosis not present

## 2023-11-30 DIAGNOSIS — J019 Acute sinusitis, unspecified: Secondary | ICD-10-CM | POA: Diagnosis not present

## 2023-11-30 NOTE — Telephone Encounter (Addendum)
Copied from CRM [id number]. Topic: Clinical - Red Word Triage >> Nov 30, 2023 10:45 AM Gaetano Hawthorne wrote: [facility] that prompted transfer to Nurse Triage: face is swollen (left side), ear pain, throat hurts, runny nose   Chief Complaint: Facial Swelling Symptoms: L side of face Frequency: Ongoing for a week Pertinent Negatives: Patient denies facial redness or rash Disposition: [] ED /[] Urgent Care (no appt availability in office) / [x] Appointment(In office/virtual)/ []  [place] [facility]/ [] Home Care/ [] Refused Recommended Disposition /[] [facility]/ []  Follow-up with PCP Additional Notes: Patient stated that she has Left side facial swelling for a week. Cause is unknown. She also has facial pain and tooth pain on the left side as well. The swelling is mild. Her pain level is 7/10. She has tried applying ice and taking Ibuprofen and it seems to help for about an hour.  Patient also reported that she has throat pain, runny nose, and pain in her left ear. Call disconnected before an appointment could be scheduled. Attempted to call patient back 3 times and was unable to reach her.   Reason for Disposition  Toothache  Answer Assessment - Initial Assessment Questions 1. ONSET: "When did the swelling start?" (e.g., minutes, hours, days)     1 week ago  2. LOCATION: "What part of the face is swollen?"     Left side   3. SEVERITY: "How swollen is it?"     Mild swelling  4. ITCHING: "Is there any itching?" If Yes, ask: "How much?"   (Scale 1-10; mild, moderate or severe)     No  5. PAIN: "Is the swelling painful to touch?" If Yes, ask: "How painful is it?"   (Scale 1-10; mild, moderate or severe)   - NONE (0): no pain   - MILD (1-3): doesn't interfere with normal activities    - MODERATE (4-7): interferes with normal activities or awakens from sleep    - SEVERE (8-10): excruciating pain, unable to do any normal activities      7/10  6. FEVER: "Do you have a fever?" If Yes, ask:  "What is it, how was it measured, and when did it start?"      Dont know  7. CAUSE: "What do you think is causing the face swelling?"     Unknown  8. OTHER SYMPTOMS: "Do you have any other symptoms?" (e.g., toothache, leg swelling)     Tooth pain L side, L ear pain, throat pain (able to drink fluids)  Protocols used: Face Swelling-A-AH

## 2023-12-01 NOTE — Telephone Encounter (Signed)
Pt went to UC to be evaluated

## 2023-12-02 ENCOUNTER — Ambulatory Visit: Payer: Medicaid Other | Admitting: Family Medicine

## 2023-12-02 ENCOUNTER — Encounter: Payer: Self-pay | Admitting: Family Medicine

## 2023-12-02 VITALS — BP 119/81 | HR 92 | Ht 60.0 in | Wt 184.1 lb

## 2023-12-02 DIAGNOSIS — J309 Allergic rhinitis, unspecified: Secondary | ICD-10-CM | POA: Diagnosis not present

## 2023-12-02 DIAGNOSIS — H6691 Otitis media, unspecified, right ear: Secondary | ICD-10-CM | POA: Diagnosis not present

## 2023-12-02 MED ORDER — PENICILLIN V POTASSIUM 500 MG PO TABS: 500.0000 mg | ORAL_TABLET | Freq: Three times a day (TID) | ORAL | 0 refills | Status: DC

## 2023-12-02 NOTE — Patient Instructions (Signed)
F/U with your Obstetrician  Penicillin is prescribed for left ear infection  Use saline nasal   flushes for allergy symptoms , that is oTC  All the best with your pregnancy and new baby boy

## 2023-12-04 DIAGNOSIS — H6691 Otitis media, unspecified, right ear: Secondary | ICD-10-CM | POA: Insufficient documentation

## 2023-12-04 DIAGNOSIS — J309 Allergic rhinitis, unspecified: Secondary | ICD-10-CM | POA: Insufficient documentation

## 2023-12-04 NOTE — Assessment & Plan Note (Signed)
 Penicillin  V x 1 week only

## 2023-12-04 NOTE — Progress Notes (Signed)
   SHAKTI FLEER     MRN: [id number]      DOB: November 21, 1995  Chief Complaint  Patient presents with   Sinusitis    Sinus issues sore throat x 1 week seen at UC they told her to follow up with pcp due to being pregnant      HPI Ms. Bellissimo is here with a 1 week h/o sore throat and left esr pain , has had left ear infection in the past . No current fever or chills, currently [redacted] weeks pregnant  C/o sinus congestion and nasal pressure  ROS See HPI   Denies chest congestion, productive cough or wheezing. Denies chest pains, palpitations and leg swelling Denies abdominal pain, nausea, vomiting,diarrhea or constipation.   Mild anxiety since pregnant states she had a miscarriage and is therefore somewhat anxious , has 3 living children Denies skin break down or rash.   PE  BP 119/81 (BP Location: Right Arm, Patient Position: Sitting, Cuff Size: Large)   Pulse 92   Ht 5' (1.524 m)   Wt 184 lb 1.9 oz (83.5 kg)   SpO2 98%   BMI 35.96 kg/m   Patient alert and oriented and in no cardiopulmonary distress.  HEENT: No facial asymmetry, EOMI,     Neck supple .Left TM erythematous , no sinus tenderness, positive nasal congestion  Chest: Clear to auscultation bilaterally.  CVS: S1, S2 no murmurs, no S3.Regular rate.  .   Ext: No edema  MS: Adequate ROM spine, shoulders, hips and knees.  Skin: Intact, no ulcerations or rash noted.  Psych: Good eye contact, normal affect. Memory intact  anxious not  depressed appearing.  CNS: CN 2-12 intact, power,  normal throughout.no focal deficits noted.   Assessment & Plan  Right otitis media Penicillin  V x 1 week only  Allergic rhinosinusitis Current flare of symptoms, advised saline nasal flushes 2 to 3 times daily

## 2023-12-04 NOTE — Assessment & Plan Note (Signed)
 Current flare of symptoms, advised saline nasal flushes 2 to 3 times daily

## 2023-12-19 ENCOUNTER — Other Ambulatory Visit: Payer: Self-pay | Admitting: Family Medicine

## 2024-01-02 DIAGNOSIS — Z3A27 27 weeks gestation of pregnancy: Secondary | ICD-10-CM | POA: Diagnosis not present

## 2024-01-02 DIAGNOSIS — Z3689 Encounter for other specified antenatal screening: Secondary | ICD-10-CM | POA: Diagnosis not present

## 2024-01-02 DIAGNOSIS — Z0372 Encounter for suspected placental problem ruled out: Secondary | ICD-10-CM | POA: Diagnosis not present

## 2024-01-02 DIAGNOSIS — O4442 Low lying placenta NOS or without hemorrhage, second trimester: Secondary | ICD-10-CM | POA: Diagnosis not present

## 2024-01-02 DIAGNOSIS — Z362 Encounter for other antenatal screening follow-up: Secondary | ICD-10-CM | POA: Diagnosis not present

## 2024-01-30 DIAGNOSIS — Z3689 Encounter for other specified antenatal screening: Secondary | ICD-10-CM | POA: Diagnosis not present

## 2024-03-01 DIAGNOSIS — Z3685 Encounter for antenatal screening for Streptococcus B: Secondary | ICD-10-CM | POA: Diagnosis not present

## 2024-03-01 DIAGNOSIS — Z3689 Encounter for other specified antenatal screening: Secondary | ICD-10-CM | POA: Diagnosis not present

## 2024-03-01 DIAGNOSIS — Z3A35 35 weeks gestation of pregnancy: Secondary | ICD-10-CM | POA: Diagnosis not present

## 2024-03-01 DIAGNOSIS — Z362 Encounter for other antenatal screening follow-up: Secondary | ICD-10-CM | POA: Diagnosis not present

## 2024-03-08 DIAGNOSIS — O368130 Decreased fetal movements, third trimester, not applicable or unspecified: Secondary | ICD-10-CM | POA: Diagnosis not present

## 2024-03-14 DIAGNOSIS — Z302 Encounter for sterilization: Secondary | ICD-10-CM | POA: Diagnosis not present

## 2024-03-14 DIAGNOSIS — O163 Unspecified maternal hypertension, third trimester: Secondary | ICD-10-CM | POA: Diagnosis not present

## 2024-03-14 DIAGNOSIS — Z3A37 37 weeks gestation of pregnancy: Secondary | ICD-10-CM | POA: Diagnosis not present

## 2024-03-14 DIAGNOSIS — Z3A38 38 weeks gestation of pregnancy: Secondary | ICD-10-CM | POA: Diagnosis not present

## 2024-03-14 DIAGNOSIS — O9081 Anemia of the puerperium: Secondary | ICD-10-CM | POA: Diagnosis not present

## 2024-03-14 DIAGNOSIS — D62 Acute posthemorrhagic anemia: Secondary | ICD-10-CM | POA: Diagnosis not present

## 2024-03-14 DIAGNOSIS — O134 Gestational [pregnancy-induced] hypertension without significant proteinuria, complicating childbirth: Secondary | ICD-10-CM | POA: Diagnosis not present

## 2024-03-15 ENCOUNTER — Ambulatory Visit (HOSPITAL_COMMUNITY): Admitting: Psychiatry

## 2024-03-16 DIAGNOSIS — O133 Gestational [pregnancy-induced] hypertension without significant proteinuria, third trimester: Secondary | ICD-10-CM | POA: Diagnosis not present

## 2024-03-16 DIAGNOSIS — Z3A38 38 weeks gestation of pregnancy: Secondary | ICD-10-CM | POA: Diagnosis not present

## 2024-04-05 ENCOUNTER — Encounter (HOSPITAL_COMMUNITY): Payer: Self-pay | Admitting: Psychiatry

## 2024-04-05 ENCOUNTER — Telehealth (INDEPENDENT_AMBULATORY_CARE_PROVIDER_SITE_OTHER): Admitting: Psychiatry

## 2024-04-05 DIAGNOSIS — F419 Anxiety disorder, unspecified: Secondary | ICD-10-CM | POA: Diagnosis not present

## 2024-04-05 DIAGNOSIS — F32A Depression, unspecified: Secondary | ICD-10-CM

## 2024-04-05 DIAGNOSIS — F53 Postpartum depression: Secondary | ICD-10-CM

## 2024-04-05 DIAGNOSIS — F900 Attention-deficit hyperactivity disorder, predominantly inattentive type: Secondary | ICD-10-CM

## 2024-04-05 MED ORDER — FLUOXETINE HCL 40 MG PO CAPS: 40.0000 mg | ORAL_CAPSULE | Freq: Every day | ORAL | 2 refills | Status: DC

## 2024-04-05 MED ORDER — FLUOXETINE HCL 20 MG PO CAPS: 20.0000 mg | ORAL_CAPSULE | Freq: Every day | ORAL | 2 refills | Status: DC

## 2024-04-05 MED ORDER — TRAZODONE HCL 50 MG PO TABS: 50.0000 mg | ORAL_TABLET | Freq: Every day | ORAL | 2 refills | Status: DC

## 2024-04-05 MED ORDER — CLONAZEPAM 0.5 MG PO TABS: 0.5000 mg | ORAL_TABLET | Freq: Two times a day (BID) | ORAL | 2 refills | Status: DC | PRN

## 2024-04-05 NOTE — Progress Notes (Signed)
 Virtual Visit via Video Note  I connected with Lacey Hartman on 04/05/24 at  1:40 PM EDT by a video enabled telemedicine application and verified that I am speaking with the correct person using two identifiers.  Location: Patient: home Provider: office   I discussed the limitations of evaluation and management by telemedicine and the availability of in person appointments. The patient expressed understanding and agreed to proceed.      I discussed the assessment and treatment plan with the patient. The patient was provided an opportunity to ask questions and all were answered. The patient agreed with the plan and demonstrated an understanding of the instructions.   The patient was advised to call back or seek an in-person evaluation if the symptoms worsen or if the condition fails to improve as anticipated.  I provided 20 minutes of non-face-to-face time during this encounter.   Lacey Annas, MD  [facility] MD/PA/NP OP Progress Note  04/05/2024 1:48 PM [name]  MRN:  [id number]  Chief Complaint:  Chief Complaint  Patient presents with   Depression   ADD   Follow-up   HPI: This patient is a 46 year old white female who lives with her boyfriend and now 4 children in [place].  She is on disability and a full-time homemaker.  The patient returns for follow-up about 9 months since her last visit.  She found out shortly after our visit that she was pregnant.  Her OB/GYN allowed her to stay on Prozac  but only 20 mg.  Her trazodone  clonazepam  and methylphenidate  were stopped during pregnancy.  She states that she had a rough pregnancy felt nauseous and sick all the time as well as extremely tired.  She gave birth to a healthy baby girl about 3 weeks ago.  She has not yet gotten back on her previous medications.  She is not breast-feeding.  She states that she feels overwhelmed and sad.  She states that her husband's family are trying to help her but she does not like them meddling.  Her  husband is trying to help with the baby at night so she could rest but she still cannot sleep.  She is very anxious.  She feels sad all the time and has low energy.  The patient denies any thoughts of self-harm or suicide or any thoughts of harming the baby.  She denies any psychotic symptoms.  I suggested that we reinstate her medications but that she also get engaged in counseling and she is in agreement. Visit Diagnosis:    ICD-10-CM   1. 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 Anxiety and depression  F41.9 FLUoxetine  (PROZAC ) 40 MG capsule   F32.A FLUoxetine  (PROZAC ) 20 MG capsule    3. 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 Three primary cancers   Cancer Paternal Grandmother        breast   Angioedema Neg Hx    Atopy Neg Hx    Eczema Neg Hx    Immunodeficiency Neg Hx    Urticaria Neg Hx      Social History:  Social History   Socioeconomic History   Marital status: Significant Other    Spouse name: Porfirio Bristol   Number of children: 3   Years of education: Not on file   Highest education level: 9th grade  Occupational History   Not on file  Tobacco Use   Smoking status: Former    Current packs/day: 0.00    Average packs/day: 0.3 packs/day for 7.0 years (2.3 ttl pk-yrs)    Types: Cigarettes    Start date: 2011    Quit date: 2018    Years since quitting: 7.4    Passive exposure: Current   Smokeless tobacco: Never  Vaping Use   Vaping status: Never Used  Substance and Sexual Activity   Alcohol use: No    Alcohol/week: 0.0 standard drinks of alcohol   Drug use: No   Sexual activity: Yes    Birth control/protection: Pill  Other Topics Concern   Not on file  Social History Narrative   Lives with Porfirio Bristol and 3 children   Lacey Hartman   Lacey Hartman      Enjoys: playing with kids, going outside, beach, crafts      Diet: does not eat a lot of veggies or fruits, a lot of junk    Caffeine: 15 sodas   Water: 4-5 cups daily      Wears seat belt    Smoke detectors   Does not use phone while driving    Social Drivers of Corporate investment banker Strain: Low Risk  (11/22/2019)   Overall Financial Resource Strain (CARDIA)    Difficulty of Paying Living Expenses: Not hard at all  Food Insecurity: No Food Insecurity (02/15/2024)   [facility]   Hunger Vital Sign    Within the past 12 months, you worried that your food would run out before you got the money to buy more.: Never true    Within the past 12 months, the food you bought just didn't last and you didn't have money to get more.: Never true  Transportation Needs: No Transportation Needs (02/15/2024)   Received from [facility] - Transportation    Lack of Transportation (Medical): No    Lack of Transportation (Non-Medical): No  Physical Activity: Sufficiently Active (02/15/2024)    Received from [facility]   Exercise Vital Sign    On average, how many days per week do you engage  in moderate to strenuous exercise (like a brisk walk)?: 7 days    On average, how many minutes do you engage in exercise at this level?: 30 min  Stress: Stress Concern Present (02/15/2024)   Received from [facility] - Occupational Stress Questionnaire    Feeling of Stress : Very much  Social Connections: Moderately Isolated (11/22/2019)   Social Connection and Isolation Panel    Frequency of Communication with Friends and Family: More than three times a week    Frequency of Social Gatherings with Friends and Family: More than three times a week    Attends Religious Services: Never    Database administrator or Organizations: No    Attends Banker Meetings: Never    Marital Status: Living with partner    Allergies:  Allergies  Allergen Reactions   Codeine Hives, Swelling and Rash    & hallucinations/    Metabolic Disorder Labs: Lab Results  Component Value Date   HGBA1C 5.3 06/06/2023   No results found for: PROLACTIN Lab Results  Component Value Date   CHOL 141 12/08/2021   TRIG 86 12/08/2021   HDL 41 12/08/2021   CHOLHDL 3.4 12/08/2021   LDLCALC 83 12/08/2021   LDLCALC 89 07/24/2019   Lab Results  Component Value Date   TSH 1.140 06/06/2023   TSH 1.400 12/08/2021    Therapeutic Level Labs: No results found for: LITHIUM No results found for: VALPROATE No results found for: CBMZ  Current Medications: Current Outpatient Medications  Medication Sig Dispense Refill   albuterol  (VENTOLIN  HFA) 108 (90 Base) MCG/ACT inhaler Inhale 1-2 puffs into the lungs every 4 (four) hours as needed for wheezing or shortness of breath. 18 g 1   clonazePAM  (KLONOPIN ) 0.5 MG tablet Take 1 tablet (0.5 mg total) by mouth 2 (two) times daily as needed for anxiety. 60 tablet 2   FLUoxetine  (PROZAC ) 20 MG capsule Take 1  capsule (20 mg total) by mouth daily. 30 capsule 2   FLUoxetine  (PROZAC ) 40 MG capsule Take 1 capsule (40 mg total) by mouth daily. 30 capsule 2   methylphenidate  (CONCERTA ) 36 MG PO CR tablet Take 1 tablet (36 mg total) by mouth every morning. 30 tablet 0   ondansetron  (ZOFRAN ) 8 MG tablet Take 1 tablet (8 mg total) by mouth every 8 (eight) hours as needed for nausea or vomiting. 20 tablet 1   penicillin  v potassium (VEETID) 500 MG tablet Take 1 tablet (500 mg total) by mouth 3 (three) times daily. 21 tablet 0   traZODone  (DESYREL ) 50 MG tablet Take 1 tablet (50 mg total) by mouth at bedtime. 30 tablet 2   No current facility-administered medications for this visit.     Musculoskeletal: Strength & Muscle Tone: within normal limits Gait & Station: normal Patient leans: N/A  Psychiatric Specialty Exam: Review of Systems  Psychiatric/Behavioral:  Positive for dysphoric mood and sleep disturbance. The patient is nervous/anxious.   All other systems reviewed and are negative.   There were no vitals taken for this visit.There is no height or weight on file to calculate BMI.  General Appearance: Casual and Fairly Groomed  Eye Contact:  Good  Speech:  Clear and Coherent  Volume:  Normal  Mood:  Anxious and Depressed  Affect:  Depressed and Flat  Thought Process:  Goal Directed  Orientation:  Full (Time, Place, and Person)  Thought Content: Rumination   Suicidal Thoughts:  No  Homicidal Thoughts:  No  Memory:  Immediate;   Good Recent;   Good Remote;   Fair  Judgement:  Fair  Insight:  Fair  Psychomotor Activity:  Decreased  Concentration:  Concentration: Poor and Attention Span: Poor  Recall:  Fair  Fund of Knowledge: Fair  Language: Good  Akathisia:  No  Handed:  Right  AIMS (if indicated): not done  Assets:  Communication Skills Desire for Improvement Physical Health Resilience Social Support  ADL's:  Intact  Cognition: Impaired,  Mild  Sleep:  Poor    Screenings: GAD-7    Flowsheet Row Video Visit from 08/11/2023 in [facility] Office Visit from 06/06/2023 in [facility] Office Visit from 02/17/2023 in [facility] at [place] Office Visit from 02/04/2023 in [facility] Video Visit from 03/11/2020 in [facility]  Total GAD-7 Score 3 21 21 14 5    PHQ2-9    Flowsheet Row Video Visit from 08/11/2023 in [facility] Office Visit from 06/06/2023 in [facility] Office Visit from 02/17/2023 in [facility] at [place] Office Visit from 02/04/2023 in [facility] Primary Care Video Visit from 11/16/2022 in [facility] at [facility] Total Score 1 2 2 3 3   PHQ-9 Total Score 1 15 15 14 9    Flowsheet Row ED from 10/04/2023 in [facility] at [facility] Office Visit from 02/17/2023 in [facility] at [place] ED from 02/05/2023 in [facility] Department at [facility]  C-SSRS RISK CATEGORY No Risk No Risk No Risk     Assessment and Plan: This patient is a 28 year old female with a history of mild cognitive impairment hearing loss depression and ADD as well as anxiety.  Since she has been off most of her medication during pregnancy she is not doing well and her mood has dropped and she is more anxious and unable to sleep.  I do think she is suffering from postpartum depression.  Will reinstate Prozac  back to 60 mg daily for depression, trazodone  50 mg at bedtime to sleep, Concerta  36 mg every morning for ADD as well as clonazepam  0.5 mg twice daily for anxiety.  She will return to see me in 4 weeks  Collaboration of Care: Collaboration of Care: Referral or follow-up with counselor/therapist AEB patient will be referred to therapy with Fayne Hoover  in our office  Patient/Guardian was advised Release of Information must be obtained prior to any record release in order to collaborate their care with an outside provider. Patient/Guardian was advised if they have not already done so to contact the registration department to sign all necessary forms in order for us  to release information regarding their care.   Consent: Patient/Guardian gives verbal consent for treatment and assignment of benefits for services provided during this visit. Patient/Guardian expressed understanding and agreed to proceed.    Lacey Annas, MD 04/05/2024, 1:48 PM

## 2024-04-25 ENCOUNTER — Ambulatory Visit (HOSPITAL_COMMUNITY): Admitting: Psychiatry

## 2024-05-02 ENCOUNTER — Other Ambulatory Visit: Payer: Self-pay

## 2024-05-02 ENCOUNTER — Emergency Department (HOSPITAL_COMMUNITY)

## 2024-05-02 ENCOUNTER — Emergency Department (HOSPITAL_COMMUNITY)
Admission: EM | Admit: 2024-05-02 | Discharge: 2024-05-02 | Disposition: A | Discharge status: Home / Self Care | Attending: Student | Admitting: Student

## 2024-05-02 ENCOUNTER — Encounter (HOSPITAL_COMMUNITY): Payer: Self-pay

## 2024-05-02 DIAGNOSIS — N760 Acute vaginitis: Secondary | ICD-10-CM | POA: Diagnosis not present

## 2024-05-02 DIAGNOSIS — N390 Urinary tract infection, site not specified: Secondary | ICD-10-CM | POA: Insufficient documentation

## 2024-05-02 DIAGNOSIS — K8020 Calculus of gallbladder without cholecystitis without obstruction: Secondary | ICD-10-CM | POA: Diagnosis not present

## 2024-05-02 DIAGNOSIS — R109 Unspecified abdominal pain: Secondary | ICD-10-CM | POA: Diagnosis present

## 2024-05-02 DIAGNOSIS — R1031 Right lower quadrant pain: Secondary | ICD-10-CM | POA: Diagnosis not present

## 2024-05-02 LAB — COMPREHENSIVE METABOLIC PANEL WITH GFR
ALT: 14 U/L (ref 0–44)
AST: 15 U/L (ref 15–41)
Albumin: 4 g/dL (ref 3.5–5.0)
Alkaline Phosphatase: 95 U/L (ref 38–126)
Anion gap: 10 (ref 5–15)
BUN: 10 mg/dL (ref 6–20)
CO2: 23 mmol/L (ref 22–32)
Calcium: 9.1 mg/dL (ref 8.9–10.3)
Chloride: 107 mmol/L (ref 98–111)
Creatinine, Ser: 0.79 mg/dL (ref 0.44–1.00)
GFR, Estimated: >60 mL/min (ref >60)
Glucose, Bld: 100 mg/dL — ABNORMAL HIGH (ref 70–99)
Potassium: 3.6 mmol/L (ref 3.5–5.1)
Sodium: 140 mmol/L (ref 135–145)
Total Bilirubin: 0.5 mg/dL (ref 0.0–1.2)
Total Protein: 7.6 g/dL (ref 6.5–8.1)

## 2024-05-02 LAB — URINALYSIS, ROUTINE W REFLEX MICROSCOPIC
Bilirubin Urine: NEGATIVE
Glucose, UA: NEGATIVE mg/dL
Ketones, ur: NEGATIVE mg/dL
Nitrite: NEGATIVE
Protein, ur: 30 mg/dL — AB
Specific Gravity, Urine: 1.02 (ref 1.005–1.030)
pH: 5 (ref 5.0–8.0)

## 2024-05-02 LAB — WET PREP, GENITAL
Sperm: NONE SEEN
Trich, Wet Prep: NONE SEEN
Yeast Wet Prep HPF POC: NONE SEEN

## 2024-05-02 LAB — CBC WITH DIFFERENTIAL/PLATELET
Abs Immature Granulocytes: 0.01 K/uL (ref 0.00–0.07)
Basophils Absolute: 0 K/uL (ref 0.0–0.1)
Basophils Relative: 0 %
Eosinophils Absolute: 0 K/uL (ref 0.0–0.5)
Eosinophils Relative: 0 %
HCT: 36.2 % (ref 36.0–46.0)
Hemoglobin: 11.6 g/dL — ABNORMAL LOW (ref 12.0–15.0)
Immature Granulocytes: 0 %
Lymphocytes Relative: 21 %
Lymphs Abs: 1.3 K/uL (ref 0.7–4.0)
MCH: 28.2 pg (ref 26.0–34.0)
MCHC: 32 g/dL (ref 30.0–36.0)
MCV: 88.1 fL (ref 80.0–100.0)
Monocytes Absolute: 0.3 K/uL (ref 0.1–1.0)
Monocytes Relative: 4 %
Neutro Abs: 4.7 K/uL (ref 1.7–7.7)
Neutrophils Relative %: 75 %
Platelets: 264 K/uL (ref 150–400)
RBC: 4.11 MIL/uL (ref 3.87–5.11)
RDW: 15.3 % (ref 11.5–15.5)
WBC: 6.4 K/uL (ref 4.0–10.5)
nRBC: 0 % (ref 0.0–0.2)

## 2024-05-02 LAB — HCG, SERUM, QUALITATIVE: Preg, Serum: NEGATIVE

## 2024-05-02 LAB — LIPASE, BLOOD: Lipase: 30 U/L (ref 11–51)

## 2024-05-02 MED ORDER — SODIUM CHLORIDE 0.9 % IV BOLUS
1000.0000 mL | Freq: Once | INTRAVENOUS | Status: AC
  Administered 2024-05-02: 1000 mL via INTRAVENOUS

## 2024-05-02 MED ORDER — CEPHALEXIN 500 MG PO CAPS: 500.0000 mg | ORAL_CAPSULE | Freq: Three times a day (TID) | ORAL | 0 refills | Status: AC

## 2024-05-02 MED ORDER — METRONIDAZOLE 500 MG PO TABS: 500.0000 mg | ORAL_TABLET | Freq: Two times a day (BID) | ORAL | 0 refills | Status: DC

## 2024-05-02 MED ORDER — ONDANSETRON 4 MG PO TBDP: 4.0000 mg | ORAL_TABLET | Freq: Three times a day (TID) | ORAL | 0 refills | Status: DC | PRN

## 2024-05-02 NOTE — ED Provider Notes (Signed)
 [place] EMERGENCY DEPARTMENT AT [facility] Provider Note   CSN: [id number] Arrival date & time: 05/02/24  9062     Patient presents with: Abdominal Pain   Lacey Hartman is a 28 y.o. female.   Patient is a 28 year old female who presents emergency department the chief complaint of bilateral low back pain and right-sided abdominal and flank pain which has been ongoing and intermittent since the birth of her child approximately 6 weeks ago.  She notes that she has not followed up with her gynecologist as of yet.  She denies any sexual intercourse since birth.  She does admit to some white discharge.  She denies any dysuria or hematuria.  She has had associated nausea, vomiting, diarrhea.  She denies any recent falls or blunt abdominal wall or back trauma.  There is been no associated fever or chills.   Abdominal Pain      Prior to Admission medications   Medication Sig Start Date End Date Taking? Authorizing Provider  albuterol  (VENTOLIN  HFA) 108 (90 Base) MCG/ACT inhaler Inhale 1-2 puffs into the lungs every 4 (four) hours as needed for wheezing or shortness of breath. 06/17/22   Melvenia Manus BRAVO, MD  clonazePAM  (KLONOPIN ) 0.5 MG tablet Take 1 tablet (0.5 mg total) by mouth 2 (two) times daily as needed for anxiety. 04/05/24 04/05/25  Okey Barnie SAUNDERS, MD  FLUoxetine  (PROZAC ) 20 MG capsule Take 1 capsule (20 mg total) by mouth daily. 04/05/24   Okey Barnie SAUNDERS, MD  FLUoxetine  (PROZAC ) 40 MG capsule Take 1 capsule (40 mg total) by mouth daily. 04/05/24   Okey Barnie SAUNDERS, MD  methylphenidate  (CONCERTA ) 36 MG PO CR tablet Take 1 tablet (36 mg total) by mouth every morning. 07/28/23   Okey Barnie SAUNDERS, MD  ondansetron  (ZOFRAN ) 8 MG tablet Take 1 tablet (8 mg total) by mouth every 8 (eight) hours as needed for nausea or vomiting. 08/11/23   Melvenia Manus BRAVO, MD  penicillin  v potassium (VEETID) 500 MG tablet Take 1 tablet (500 mg total) by mouth 3 (three) times daily. 12/02/23    Antonetta Rollene BRAVO, MD  traZODone  (DESYREL ) 50 MG tablet Take 1 tablet (50 mg total) by mouth at bedtime. 04/05/24   Okey Barnie SAUNDERS, MD    Allergies: Codeine    Review of Systems  Gastrointestinal:  Positive for abdominal pain.  All other systems reviewed and are negative.   Updated Vital Signs BP 114/72 (BP Location: Right Arm)   Pulse 79   Temp 98.2 F (36.8 C) (Oral)   Resp 16   Ht 5' (1.524 m)   Wt 72.6 kg   LMP 04/25/2024 (Exact Date)   SpO2 99%   BMI 31.25 kg/m   Physical Exam Vitals and nursing note reviewed.  Constitutional:      Appearance: Normal appearance.  HENT:     Head: Normocephalic and atraumatic.     Nose: Nose normal.     Mouth/Throat:     Mouth: Mucous membranes are moist.  Eyes:     Extraocular Movements: Extraocular movements intact.     Conjunctiva/sclera: Conjunctivae normal.     Pupils: Pupils are equal, round, and reactive to light.  Cardiovascular:     Rate and Rhythm: Normal rate and regular rhythm.     Pulses: Normal pulses.     Heart sounds: Normal heart sounds. No murmur heard.    No gallop.  Pulmonary:     Effort: Pulmonary effort is normal. No respiratory distress.  Breath sounds: Normal breath sounds. No wheezing or rales.  Abdominal:     General: Abdomen is flat. Bowel sounds are normal. There is no distension.     Palpations: Abdomen is soft. There is no mass.     Tenderness: There is abdominal tenderness in the right upper quadrant, right lower quadrant and suprapubic area. There is no guarding.     Hernia: No hernia is present.  Musculoskeletal:        General: Normal range of motion.     Cervical back: Normal range of motion and neck supple.     Comments: Tender to palpation noted over lumbar spine and lumbar paraspinous muscles  Skin:    General: Skin is warm and dry.     Findings: No erythema or rash.  Neurological:     General: No focal deficit present.     Mental Status: She is alert and oriented to person,  place, and time. Mental status is at baseline.  Psychiatric:        Mood and Affect: Mood normal.        Behavior: Behavior normal.        Thought Content: Thought content normal.        Judgment: Judgment normal.     (all labs ordered are listed, but only abnormal results are displayed) Labs Reviewed  WET PREP, GENITAL  LIPASE, BLOOD  COMPREHENSIVE METABOLIC PANEL WITH GFR  URINALYSIS, ROUTINE W REFLEX MICROSCOPIC  CBC WITH DIFFERENTIAL/PLATELET  HCG, SERUM, QUALITATIVE  GC/CHLAMYDIA PROBE AMP ([place]) NOT AT [facility]    EKG: None  Radiology: No results found.   Procedures   Medications Ordered in the ED  sodium chloride  0.9 % bolus 1,000 mL (has no administration in time range)                                    Medical Decision Making Amount and/or Complexity of Data Reviewed Labs: ordered. Radiology: ordered.  Risk Prescription drug management.   This patient presents to the ED for concern of low back pain, suprapubic pain differential diagnosis includes acute appendicitis, cholecystitis, spinal traction, diverticulitis, ovarian torsion or cyst, PID, tumor abscess, pyelonephritis, urinary tract infection, pancreatitis, mesenteric ischemia    Additional history obtained:  Additional history obtained from medical records External records from outside source obtained and reviewed including none per records   Lab Tests:  I Ordered, and personally interpreted labs.  The pertinent results include: No leukocytosis mild anemia, normal electrolytes, normal kidney function liver function, normal lipase, urinalysis with moderate leukocytes   Imaging Studies ordered:  I ordered imaging studies including CT scan of abdomen pelvis, pelvic ultrasound I independently visualized and interpreted imaging which showed no ovarian torsion, no cyst, no acute intra-abdominal surgical process I agree with the radiologist interpretation   Medicines ordered and  prescription drug management:  I ordered medication including IV fluids, Keflex , Flagyl  for urinary tract infection, bacterial vaginosis Reevaluation of the patient after these medicines showed that the patient improved I have reviewed the patients home medicines and have made adjustments as needed   Problem List / ED Course:  Patient is doing well at this time and is stable for discharge home.  Discussed with patient that she does appear to be suffering from underlying urinary tract infection and bacterial vaginosis at this time.  Will treat patient accordingly on outpatient basis with antibiotics.  CT scan and ultrasound  of the pelvis and abdomen were unremarkable.  She has no indication for acute surgical process at this time.  Vital signs are stable with no indication for sepsis.  The need for close follow-up with primary care doctor on outpatient basis was discussed as well as strict turn precautions for any new or worsening symptoms.  Patient voiced understanding and had no additional questions.   Social Determinants of Health:  None        Final diagnoses:  None    ED Discharge Orders     None          Eber Ferrufino D, PA-C 05/02/24 1305    Albertina Dixon, MD 05/03/24 [phone]

## 2024-05-02 NOTE — ED Triage Notes (Signed)
 Pt arrived via pOV c/o bilateral flank and back pain that radiates around to her suprapubic region. Pt reports pain and discomfort have been present since recent delivery of her child. Pt reports she has had a menstrual cycle since delivery, denies hematuria, denies hematochezia. Pt reports intermittent nausea w/o emesis.

## 2024-05-02 NOTE — Discharge Instructions (Signed)
 Please follow-up closely with your primary care doctor on an outpatient basis.  Take all antibiotics as directed.  Return to emergency department immediately for any new or worsening symptoms.  [facility] Primary Care Doctor List    Rollene Pesa, MD. Specialty: [facility] Medicine Contact information: [address]  [place] [address]  [phone]   Glendia Fielding, MD. Specialty: [facility] Contact information: [address]  [place] [address]  [phone]   Benita Outhouse, MD Specialty: Internal Medicine Contact information: [address] [place] [address]  [phone]   Darlyn Hurst, MD. Specialty: Internal Medicine Contact information: [address]  [place] [address]  [phone]    [facility] ([facility] Specialty: Family Medicine Contact information: [address]  [place] [address]  [phone]   Garnette Lolling, MD. Specialty: [facility] Medicine Contact information: [address]  [address]  [place] [address]  [phone]   Gaither Langton, MD. Specialty: Internal Medicine Contact information: [address]  [address]  [place] [address]  [phone]   [facility] Medicine: [address] [phone]  [place], Family medicine [address]  [phone]  [facility] [address] [place], [place] [phone]  [facility]: [address] [phone]    [facility]  [address] [place], [place] [address] [phone]  Services [facility] offers a variety of basic health services.  Services include but are not limited to: Blood pressure checks  Heart rate checks  Blood sugar checks  Urine analysis  Rapid strep tests  Pregnancy tests.  Health education and referrals  People needing more complex services will be directed to a physician online. Using these  virtual visits, doctors can evaluate and prescribe medicine and treatments. There will be no medication on-site, though [facility] will help patients fill their prescriptions at little to no cost.   For More information please go to: [email or web address]  Allergy and Asthma:    [address] [place] [phone]  Urology:  [address]   [phone]  [facility]  [address] [place], [place] [phone]  Orthopedics   [address] [place], [place] [phone]  Endocrinology  [address] [place], [place] [phone]  Podiatry: [facility] [phone]

## 2024-05-02 NOTE — ED Notes (Signed)
 ED Provider at bedside.

## 2024-05-03 ENCOUNTER — Encounter (HOSPITAL_COMMUNITY): Payer: Self-pay | Admitting: Psychiatry

## 2024-05-03 ENCOUNTER — Telehealth (HOSPITAL_COMMUNITY): Admitting: Psychiatry

## 2024-05-03 DIAGNOSIS — F32A Depression, unspecified: Secondary | ICD-10-CM | POA: Diagnosis not present

## 2024-05-03 DIAGNOSIS — F419 Anxiety disorder, unspecified: Secondary | ICD-10-CM | POA: Diagnosis not present

## 2024-05-03 LAB — GC/CHLAMYDIA PROBE AMP (~~LOC~~) NOT AT ARMC
Chlamydia: NEGATIVE
Comment: NEGATIVE
Comment: NORMAL
Neisseria Gonorrhea: NEGATIVE

## 2024-05-03 MED ORDER — FLUOXETINE HCL 40 MG PO CAPS: 40.0000 mg | ORAL_CAPSULE | Freq: Every day | ORAL | 2 refills | Status: DC

## 2024-05-03 MED ORDER — FLUOXETINE HCL 20 MG PO CAPS: 20.0000 mg | ORAL_CAPSULE | Freq: Every day | ORAL | 2 refills | Status: DC

## 2024-05-03 MED ORDER — METHYLPHENIDATE HCL ER (OSM) 36 MG PO TBCR: 36.0000 mg | EXTENDED_RELEASE_TABLET | ORAL | 0 refills | Status: DC

## 2024-05-03 MED ORDER — CLONAZEPAM 0.5 MG PO TABS
0.5000 mg | ORAL_TABLET | Freq: Three times a day (TID) | ORAL | 2 refills | Status: DC | PRN
Start: 2024-05-03 — End: 2024-07-10

## 2024-05-03 NOTE — Progress Notes (Signed)
 Virtual Visit via Video Note  I connected with Lacey Hartman on 05/03/24 at  1:40 PM EDT by a video enabled telemedicine application and verified that I am speaking with the correct person using two identifiers.  Location: Patient: home Provider: office   I discussed the limitations of evaluation and management by telemedicine and the availability of in person appointments. The patient expressed understanding and agreed to proceed.     I discussed the assessment and treatment plan with the patient. The patient was provided an opportunity to ask questions and all were answered. The patient agreed with the plan and demonstrated an understanding of the instructions.   The patient was advised to call back or seek an in-person evaluation if the symptoms worsen or if the condition fails to improve as anticipated.  I provided 20 minutes of non-face-to-face time during this encounter.   Barnie Gull, MD  [facility] MD/PA/NP OP Progress Note  05/03/2024 1:46 PM Lacey Hartman  MRN:  [id number]  Chief Complaint:  Chief Complaint  Patient presents with   ADD   Anxiety   Depression   Follow-up   HPI: This patient is a 28 year old white female who lives with her boyfriend and now 4 children in [place]. She is on disability and a full-time homemaker.   Patient returns for follow-up after 4 weeks regarding her depression anxiety and ADD.  She told me last time that she had recently given birth to a baby girl.  The child is now 73 weeks old.  She has been very anxious since having this baby since it has been 8 years since her last child.  She states the other night the baby seemed like she was choking on her milk and they brought her to the emergency room.  She states that the doctor there told her that the baby had been overfed.  She felt guilty and upset that either self or doing something wrong.  She states since then she has been a lot more anxious.  She denies significant depression or thoughts  of self-harm or harm to the baby.  She does feel more nervous being around the baby but her husband is usually there with her or someone else in the family is there as well.  Her appointment with the therapist had been canceled and she is waiting to get another one.  In the meantime she asked if we can increase the clonazepam  when I am not too willing to increase it much given addiction potential but we can go to 0.5 mg up to 3 times a day.  The patient states her sleep is erratic because she has to sleep when the baby sleeps which is often during the day.  She does think that Concerta  is helping her to stay focused. Visit Diagnosis:    ICD-10-CM   1. 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 Three primary cancers   Cancer Paternal Grandmother        breast   Angioedema Neg Hx     Atopy Neg Hx    Eczema Neg Hx    Immunodeficiency Neg Hx    Urticaria Neg Hx     Social History:  Social History   Socioeconomic History   Marital status: Significant Other    Spouse name: Lamar   Number of children: 3   Years of education: Not on file   Highest education level: 9th grade  Occupational History   Not on file  Tobacco Use   Smoking status: Former    Current packs/day: 0.00    Average packs/day: 0.3 packs/day for 7.0 years (2.3 ttl pk-yrs)    Types: Cigarettes    Start date: 2011    Quit date: 2018    Years since quitting: 7.5    Passive exposure: Current   Smokeless tobacco: Never  Vaping Use   Vaping status: Never Used  Substance and Sexual Activity   Alcohol use: No    Alcohol/week: 0.0 standard drinks of alcohol   Drug use: No   Sexual activity: Yes    Birth control/protection: Pill  Other Topics Concern   Not on file  Social History Narrative   Lives with Lamar and 3 children   [name]   [name]      Enjoys: playing with kids, going outside, beach, crafts      Diet: does not eat a lot of veggies or fruits, a lot of junk    Caffeine: 15 sodas   Water: 4-5 cups daily      Wears seat belt    Smoke detectors   Does not use phone while driving    Social Drivers of Corporate investment banker Strain: Low Risk  (11/22/2019)   Overall Financial Resource Strain (CARDIA)    Difficulty of Paying Living Expenses: Not hard at all  Food Insecurity: No Food Insecurity (02/15/2024)   [facility] from [facility]   Hunger Vital Sign    Within the past 12 months, you worried that your food would run out before you got the money to buy more.: Never true    Within the past 12 months, the food you bought just didn't last and you didn't have money to get more.: Never true  Transportation Needs: No Transportation Needs (02/15/2024)   [facility] from [facility] - Transportation    Lack of Transportation (Medical): No    Lack of  Transportation (Non-Medical): No  Physical Activity: Sufficiently Active (02/15/2024)   Received from [facility]  Exercise Vital Sign    On average, how many days per week do you engage in moderate to strenuous exercise (like a brisk walk)?: 7 days    On average, how many minutes do you engage in exercise at this level?: 30 min  Stress: Stress Concern Present (02/15/2024)   Received from [facility] - Occupational Stress Questionnaire    Feeling of Stress : Very much  Social Connections: Moderately Isolated (11/22/2019)   Social Connection and Isolation Panel    Frequency of Communication with Friends and Family: More than three times a week    Frequency of Social Gatherings with Friends and Family: More than three times a week    Attends Religious Services: Never    Database administrator or Organizations: No    Attends Banker Meetings: Never    Marital Status: Living with partner    Allergies:  Allergies  Allergen Reactions   Iohexol     Patient states that her granny told her she had a reaction to contrast the last time she had a contrasted CT. Patient unable to give any history of reaction.    Codeine Hives, Swelling and Rash    & hallucinations/    Metabolic Disorder Labs: Lab Results  Component Value Date   HGBA1C 5.3 06/06/2023   No results found for: PROLACTIN Lab Results  Component Value Date   CHOL 141 12/08/2021   TRIG 86 12/08/2021   HDL 41 12/08/2021   CHOLHDL 3.4 12/08/2021   LDLCALC 83 12/08/2021   LDLCALC 89 07/24/2019   Lab Results  Component Value Date   TSH 1.140 06/06/2023   TSH 1.400 12/08/2021    Therapeutic Level Labs: No results found for: LITHIUM No results found for: VALPROATE No results found for: CBMZ  Current Medications: Current Outpatient Medications  Medication Sig Dispense Refill   albuterol  (VENTOLIN  HFA) 108 (90 Base) MCG/ACT inhaler Inhale 1-2 puffs  into the lungs every 4 (four) hours as needed for wheezing or shortness of breath. 18 g 1   cephALEXin  (KEFLEX ) 500 MG capsule Take 1 capsule (500 mg total) by mouth 3 (three) times daily for 7 days. 21 capsule 0   clonazePAM  (KLONOPIN ) 0.5 MG tablet Take 1 tablet (0.5 mg total) by mouth 3 (three) times daily as needed for anxiety. 90 tablet 2   FLUoxetine  (PROZAC ) 20 MG capsule Take 1 capsule (20 mg total) by mouth daily. 30 capsule 2   FLUoxetine  (PROZAC ) 40 MG capsule Take 1 capsule (40 mg total) by mouth daily. 30 capsule 2   methylphenidate  (CONCERTA ) 36 MG PO CR tablet Take 1 tablet (36 mg total) by mouth every morning. 30 tablet 0   metroNIDAZOLE  (FLAGYL ) 500 MG tablet Take 1 tablet (500 mg total) by mouth 2 (two) times daily. 14 tablet 0   ondansetron  (ZOFRAN ) 8 MG tablet Take 1 tablet (8 mg total) by mouth every 8 (eight) hours as needed for nausea or vomiting. 20 tablet 1   ondansetron  (ZOFRAN -ODT) 4 MG disintegrating tablet Take 1 tablet (4 mg total) by mouth every 8 (eight) hours as needed for nausea or vomiting. 20 tablet 0   traZODone  (DESYREL ) 50 MG tablet Take 1 tablet (50 mg total) by mouth at bedtime. 30 tablet 2   No current facility-administered medications for this visit.     Musculoskeletal: Strength & Muscle Tone: within normal limits Gait & Station: normal Patient leans: N/A  Psychiatric Specialty Exam:  Review of Systems  Psychiatric/Behavioral:  The patient is nervous/anxious.   All other systems reviewed and are negative.   Last menstrual period 04/25/2024.There is no height or weight on file to calculate BMI.  General Appearance: Casual and Fairly Groomed  Eye Contact:  Good  Speech:  Clear and Coherent  Volume:  Normal  Mood:  Anxious  Affect:  Congruent  Thought Process:  Goal Directed  Orientation:  Full (Time, Place, and Person)  Thought Content: Rumination   Suicidal Thoughts:  No  Homicidal Thoughts:  No  Memory:  Immediate;   Good Recent;    Good Remote;   Fair  Judgement:  Good  Insight:  Fair  Psychomotor Activity:  Normal  Concentration:  Concentration: Good and Attention Span: Good  Recall:  Good  Fund of Knowledge: Good  Language: Good  Akathisia:  No  Handed:  Right  AIMS (if indicated): not done  Assets:  Communication Skills Desire for Improvement Physical Health Resilience Social Support  ADL's:  Intact  Cognition: WNL  Sleep:  Fair   Screenings: GAD-7    Flowsheet Row Video Visit from 08/11/2023 in [facility] Care Office Visit from 06/06/2023 in [facility] Primary Care Office Visit from 02/17/2023 in [facility] Outpatient [facility] at [place] Office Visit from 02/04/2023 in [facility] Primary Care Video Visit from 03/11/2020 in [facility] Care  Total GAD-7 Score 3 21 21 14 5    PHQ2-9    Flowsheet Row Video Visit from 08/11/2023 in [facility] Primary Care Office Visit from 06/06/2023 in [name] Primary Care Office Visit from 02/17/2023 in [facility] Outpatient Behavioral Health at [place] Office Visit from 02/04/2023 in [facility] Primary Care Video Visit from 11/16/2022 in [facility] Health at [facility] Total Score 1 2 2 3 3   PHQ-9 Total Score 1 15 15 14 9    Flowsheet Row ED from 05/02/2024 in [facility] Emergency Department at [facility] ED from 10/04/2023 in [facility] Emergency Department at [facility] Office Visit from 02/17/2023 in [facility] at [place]  C-SSRS RISK CATEGORY No Risk No Risk No Risk     Assessment and Plan: This patient is a 28 year old female with a history of mild cognitive impairment hearing loss depression anxiety and ADD.  She had been dealing with postpartum depression and anxiety.  The depression seems to be better and she is focusing well now that she is back on her medication however she  has been more anxious with this baby.  We will increase clonazepam  to 0.5 mg 3 times daily as needed.  She will continue Prozac  to 60 mg daily for depression and Concerta  36 mg every morning for ADD.  She will return to see me in 4 weeks  Collaboration of Care: Collaboration of Care: Referral or follow-up with counselor/therapist AEB patient will be rescheduled with Winton Rubinstein in our office for therapy  Patient/Guardian was advised Release of Information must be obtained prior to any record release in order to collaborate their care with an outside provider. Patient/Guardian was advised if they have not already done so to contact the registration department to sign all necessary forms in order for us  to release information regarding their care.   Consent: Patient/Guardian gives verbal consent for treatment and assignment of benefits for services provided during this visit. Patient/Guardian expressed understanding and agreed to proceed.    Barnie  Okey, MD 05/03/2024, 1:46 PM

## 2024-05-04 LAB — URINE CULTURE: Culture: 10000 — AB

## 2024-05-05 ENCOUNTER — Telehealth (HOSPITAL_BASED_OUTPATIENT_CLINIC_OR_DEPARTMENT_OTHER): Payer: Self-pay | Admitting: *Deleted

## 2024-05-05 NOTE — Telephone Encounter (Signed)
 Post ED Visit - Positive Culture Follow-up: Successful Patient Follow-Up  Culture assessed and recommendations reviewed by:  []  Rankin Dee, Pharm.D. []  Venetia Gully, Pharm.D., BCPS AQ-ID []  Garrel Crews, Pharm.D., BCPS []  Almarie Lunger, Pharm.D., BCPS []  [place], [place].D., BCPS, AAHIVP []  Rosaline Bihari, Pharm.D., BCPS, AAHIVP []  Vernell Meier, PharmD, BCPS []  Latanya Hint, PharmD, BCPS []  Donald Medley, PharmD, BCPS [x]  Dorn Buttner, PharmD  Positive urine culture  Plan: Continue Flagyl .  Stop Keflex  Changes discussed with ED provider: Lonni Sakai, MD New antibiotic prescription Amoxicillin  875mg  Q12hrs x 5 days (Qty 10) Spoke to pt and advised to continue Flagyl  and Stop Keflex  and called in new Rx  Called to [place], [place], [place]  Contacted patient, date 05/05/24, time 1324   Lacey Hartman 05/05/2024, 1:24 PM

## 2024-05-05 NOTE — Progress Notes (Signed)
 ED Antimicrobial Stewardship Positive Culture Follow Up   Lacey Hartman is an 28 y.o. female who presented to [facility] on @ADMITDT @ with a chief complaint of  Chief Complaint  Patient presents with   Abdominal Pain    Recent Results (from the past 720 hours)  Wet prep, genital     Status: Abnormal   Collection Time: 05/02/24 11:30 AM   Specimen: Urine, Clean Catch  Result Value Ref Range Status   Yeast Wet Prep HPF POC NONE SEEN NONE SEEN Final   Trich, Wet Prep NONE SEEN NONE SEEN Final   Clue Cells Wet Prep HPF POC PRESENT (A) NONE SEEN Final   WBC, Wet Prep HPF POC FEW (A) <10 Final   Sperm NONE SEEN  Final    Comment: Performed at [facility], [address], [place], [place] [address]  Urine Culture     Status: Abnormal   Collection Time: 05/02/24 12:30 PM   Specimen: Urine, Clean Catch  Result Value Ref Range Status   Specimen Description   Final    URINE, CLEAN CATCH Performed at [facility], [address], [place], [place] [address]    Special Requests   Final    NONE Performed at [facility], [address] [place], [place] [address]    Culture 10,000 COLONIES/mL ENTEROCOCCUS FAECALIS (A)  Final   Report Status 05/04/2024 FINAL  Final   Organism ID, Bacteria ENTEROCOCCUS FAECALIS (A)  Final      Susceptibility   Enterococcus faecalis - MIC*    AMPICILLIN <=2 SENSITIVE Sensitive     NITROFURANTOIN  <=16 SENSITIVE Sensitive     VANCOMYCIN 2 SENSITIVE Sensitive     * 10,000 COLONIES/mL ENTEROCOCCUS FAECALIS    [x]  Treated with Kelfex/Flagyl , organism resistant to prescribed antimicrobial  CONTINUE Flagyl  STOP Keflex  START: Amoxicillin  875 mg tablets take 1 tablet q12h x 5 days (Qty 10; Refills 0)  ED Provider: [place]   Dorn Buttner, PharmD, BCPS 05/05/2024 11:02 AM ED Clinical Pharmacist -  [phone]

## 2024-05-18 ENCOUNTER — Ambulatory Visit

## 2024-05-18 ENCOUNTER — Other Ambulatory Visit (HOSPITAL_COMMUNITY): Payer: Self-pay

## 2024-05-18 ENCOUNTER — Telehealth: Payer: Self-pay | Admitting: Pharmacy Technician

## 2024-05-18 VITALS — BP 115/80 | HR 64 | Ht 60.0 in | Wt 183.0 lb

## 2024-05-18 DIAGNOSIS — H60502 Unspecified acute noninfective otitis externa, left ear: Secondary | ICD-10-CM

## 2024-05-18 DIAGNOSIS — J45909 Unspecified asthma, uncomplicated: Secondary | ICD-10-CM

## 2024-05-18 DIAGNOSIS — N3000 Acute cystitis without hematuria: Secondary | ICD-10-CM

## 2024-05-18 MED ORDER — ALBUTEROL SULFATE HFA 108 (90 BASE) MCG/ACT IN AERS: 1.0000 | INHALATION_SPRAY | RESPIRATORY_TRACT | 2 refills | Status: AC | PRN

## 2024-05-18 MED ORDER — NITROFURANTOIN MONOHYD MACRO 100 MG PO CAPS: 100.0000 mg | ORAL_CAPSULE | Freq: Two times a day (BID) | ORAL | 0 refills | Status: AC

## 2024-05-18 MED ORDER — ACETIC ACID 2 % OT SOLN
4.0000 [drp] | Freq: Three times a day (TID) | OTIC | 0 refills | Status: AC
Start: 2024-05-18 — End: 2024-05-23

## 2024-05-18 NOTE — Progress Notes (Signed)
 Established Patient Office Visit  Subjective   Patient ID: Lacey Hartman, female    DOB: [birth year]  Age: 28 y.o. 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   Nausea without vomiting 08/11/2023   Anxiety and depression 06/06/2023   Fatigue 06/06/2023   Acute bacterial sinusitis 02/09/2023   Uses birth control 06/17/2022   UTI symptoms 05/11/2022   Low back pain 05/11/2022   Preventative health care 12/03/2021   Breast pain, left 12/03/2021   Screening due 12/03/2021   Neck pain 10/05/2021   Otitis 05/20/2021   Vertigo 04/29/2021   Vaccine counseling 04/29/2021   Dysuria 11/20/2020   Vaginal odor 11/20/2020   IBS (irritable bowel syndrome) 07/23/2020   Impacted cerumen of right ear 07/18/2020   Bloated abdomen 06/11/2020   Axillary tenderness, left 12/20/2019   Breast tenderness in female 12/20/2019   Lump in lower inner quadrant of right breast 12/20/2019   Family history of breast cancer 12/20/2019   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   GERD (gastroesophageal reflux disease) 11/22/2019   Cluster headache, not intractable 11/22/2019   Psychophysiological insomnia 11/22/2019   Anxiety 11/22/2019   Mild asthma 11/22/2019   Obesity (BMI 30-39.9) 11/22/2019   Depression 06/10/2016   ADD (attention deficit disorder) 12/24/2015      ROS    Objective:     BP 115/80   Pulse 64   Ht 5' (1.524 m)   Wt 183 lb (83 kg)   LMP 04/25/2024 (Exact Date)   SpO2 98%   BMI 35.74 kg/m  BP Readings from Last 3 Encounters:  05/18/24 115/80  05/02/24 114/72  12/02/23 119/81   Wt Readings from Last 3 Encounters:  05/18/24 183 lb (83 kg)  05/02/24 160 lb (72.6 kg)  12/02/23 184 lb 1.9 oz (83.5 kg)      Physical Exam Vitals and nursing note reviewed.   Constitutional:      Appearance: Normal appearance.  HENT:     Head: Normocephalic.     Right Ear: Tympanic membrane normal. Decreased hearing noted.     Left Ear: Tympanic membrane normal. Decreased hearing noted. Drainage and tenderness present.     Ears:     Comments: Wears bilateral hearing aids, but not wearing today, d/t ear pain in left ear.  Eyes:     Extraocular Movements: Extraocular movements intact.     Pupils: Pupils are equal, round, and reactive to light.  Cardiovascular:     Rate and Rhythm: Normal rate and regular rhythm.  Pulmonary:     Effort: Pulmonary effort is normal.     Breath sounds: Normal breath sounds.  Abdominal:     General: Abdomen is flat. Bowel sounds are normal.     Tenderness: There is right CVA tenderness and left CVA tenderness.  Musculoskeletal:     Cervical back: Normal range of motion and neck supple.  Neurological:     Mental Status: She is alert and oriented to person, place, and time.  Psychiatric:        Mood and Affect: Mood normal.        Thought Content: Thought content normal.      No results found for any visits on 05/18/24.    The ASCVD Risk score (Arnett DK, et al.,  2019) failed to calculate for the following reasons:   The 2019 ASCVD risk score is only valid for ages 41 to 52    Assessment & Plan:   Problem List Items Addressed This Visit       Respiratory   Mild asthma   Relevant Medications   albuterol  (VENTOLIN  HFA) 108 (90 Base) MCG/ACT inhaler   Other Visit Diagnoses       Acute cystitis without hematuria    -  Primary   Relevant Medications   nitrofurantoin , macrocrystal-monohydrate, (MACROBID ) 100 MG capsule     Acute otitis externa of left ear, unspecified type       Relevant Medications   acetic acid 2 % otic solution       Return for as needed.    Leita Longs, FNP

## 2024-05-21 ENCOUNTER — Other Ambulatory Visit (HOSPITAL_COMMUNITY): Payer: Self-pay

## 2024-05-21 NOTE — Telephone Encounter (Signed)
 Pharmacy Patient Advocate Encounter   Received notification from Onbase that prior authorization for Albuterol  sulfate HFA 71mcg/act inhaler is required/requested.   Insurance verification completed.   The patient is insured through [facility] .   Per test claim:  BRAND NAME VENTOLIN  HFA is preferred by the insurance.  If suggested medication is appropriate, Please send in a new RX and discontinue this one. If not, please advise as to why it's not appropriate so that we may request a Prior Authorization. Please note, some preferred medications may still require a PA.  If the suggested medications have not been trialed and there are no contraindications to their use, the PA will not be submitted, as it will not be approved.  New prescription in not required. Can be switched at the pharmacy as brand name preferred. Patient's pharmacy dispensed brand name Ventolin  HFA on 05/18/2024. Next refill is due on 05/31/2024.

## 2024-06-08 ENCOUNTER — Encounter: Payer: Self-pay | Admitting: Radiology

## 2024-06-20 DIAGNOSIS — Z3202 Encounter for pregnancy test, result negative: Secondary | ICD-10-CM | POA: Diagnosis not present

## 2024-06-20 DIAGNOSIS — Z30017 Encounter for initial prescription of implantable subdermal contraceptive: Secondary | ICD-10-CM | POA: Diagnosis not present

## 2024-07-02 ENCOUNTER — Other Ambulatory Visit (HOSPITAL_COMMUNITY): Payer: Self-pay | Admitting: Psychiatry

## 2024-07-02 ENCOUNTER — Telehealth (HOSPITAL_COMMUNITY): Payer: Self-pay | Admitting: *Deleted

## 2024-07-02 MED ORDER — METHYLPHENIDATE HCL ER (OSM) 36 MG PO TBCR: 36.0000 mg | EXTENDED_RELEASE_TABLET | ORAL | 0 refills | Status: DC

## 2024-07-02 NOTE — Telephone Encounter (Signed)
 Called number on file to inform patient with what provider stated and was not able to reach her. Per auto message patient voicemail box is full

## 2024-07-02 NOTE — Telephone Encounter (Signed)
 sent

## 2024-07-02 NOTE — Telephone Encounter (Signed)
 Patient called stating she is needing refills for her Concerta . Per pt she would like for the script to be sent to [facility] in [place]. Patient have f/u on the 23rd.

## 2024-07-03 ENCOUNTER — Ambulatory Visit (HOSPITAL_COMMUNITY): Admitting: Psychiatry

## 2024-07-10 ENCOUNTER — Telehealth (HOSPITAL_COMMUNITY): Admitting: Psychiatry

## 2024-07-10 ENCOUNTER — Encounter (HOSPITAL_COMMUNITY): Payer: Self-pay | Admitting: Psychiatry

## 2024-07-10 DIAGNOSIS — F32A Depression, unspecified: Secondary | ICD-10-CM | POA: Diagnosis not present

## 2024-07-10 DIAGNOSIS — F900 Attention-deficit hyperactivity disorder, predominantly inattentive type: Secondary | ICD-10-CM

## 2024-07-10 DIAGNOSIS — F53 Postpartum depression: Secondary | ICD-10-CM

## 2024-07-10 DIAGNOSIS — R030 Elevated blood-pressure reading, without diagnosis of hypertension: Secondary | ICD-10-CM | POA: Diagnosis not present

## 2024-07-10 DIAGNOSIS — J069 Acute upper respiratory infection, unspecified: Secondary | ICD-10-CM | POA: Diagnosis not present

## 2024-07-10 DIAGNOSIS — F419 Anxiety disorder, unspecified: Secondary | ICD-10-CM

## 2024-07-10 MED ORDER — TRAZODONE HCL 50 MG PO TABS: 50.0000 mg | ORAL_TABLET | Freq: Every day | ORAL | 2 refills | Status: DC

## 2024-07-10 MED ORDER — METHYLPHENIDATE HCL ER (OSM) 36 MG PO TBCR: 36.0000 mg | EXTENDED_RELEASE_TABLET | ORAL | 0 refills | Status: DC

## 2024-07-10 MED ORDER — FLUOXETINE HCL 20 MG PO CAPS: 20.0000 mg | ORAL_CAPSULE | Freq: Every day | ORAL | 2 refills | Status: DC

## 2024-07-10 MED ORDER — METHYLPHENIDATE HCL ER (OSM) 36 MG PO TBCR: 36.0000 mg | EXTENDED_RELEASE_TABLET | Freq: Every day | ORAL | 0 refills | Status: DC

## 2024-07-10 MED ORDER — CLONAZEPAM 0.5 MG PO TABS: 0.5000 mg | ORAL_TABLET | Freq: Three times a day (TID) | ORAL | 2 refills | Status: DC | PRN

## 2024-07-10 MED ORDER — FLUOXETINE HCL 40 MG PO CAPS: 40.0000 mg | ORAL_CAPSULE | Freq: Every day | ORAL | 2 refills | Status: DC

## 2024-07-10 NOTE — Progress Notes (Signed)
 Virtual Visit via Video Note  I connected with Lacey Hartman on 07/10/24 at 11:20 AM EDT by a video enabled telemedicine application and verified that I am speaking with the correct person using two identifiers.  Location: Patient: home Provider: office   I discussed the limitations of evaluation and management by telemedicine and the availability of in person appointments. The patient expressed understanding and agreed to proceed.      I discussed the assessment and treatment plan with the patient. The patient was provided an opportunity to ask questions and all were answered. The patient agreed with the plan and demonstrated an understanding of the instructions.   The patient was advised to call back or seek an in-person evaluation if the symptoms worsen or if the condition fails to improve as anticipated.  I provided 20 minutes of non-face-to-face time during this encounter.   Barnie Gull, MD  [facility] MD/PA/NP OP Progress Note  07/10/2024 11:25 AM Lacey Hartman  MRN:  [id number]  Chief Complaint:  Chief Complaint  Patient presents with   Anxiety   Depression   ADD   Follow-up   HPI: This patient is a 78 year old white female who lives with her boyfriend and now 4 children in [place]. She is on disability and a full-time homemaker.  The patient returns for follow-up after 2 months regarding her depression and anxiety and ADD.  Last time she was having some postpartum depression as well as significant anxiety after giving birth.  Her baby is now 43 months old.  We did increase both her clonazepam  and her Prozac  and she seems to be doing better.  The patient states that she has a cold right now.  However she states her mood has been good and she denies significant depression anxiety or panic attacks.  The baby does wake up several times for feedings but her husband helps out with the feedings.  She tries to sleep when the baby sleeps.  She is focusing well with the Concerta . Visit  Diagnosis:    ICD-10-CM   1. 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 Anxiety and depression  F41.9 FLUoxetine  (PROZAC ) 20 MG capsule   F32.A FLUoxetine  (PROZAC ) 40 MG capsule    3. 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 Three primary cancers   Cancer Paternal Grandmother        breast   Angioedema Neg Hx    Atopy Neg Hx    Eczema Neg Hx    Immunodeficiency Neg Hx    Urticaria Neg Hx     Social History:  Social History   Socioeconomic History   Marital status: Significant Other    Spouse name: Lacey Hartman   Number of children: 3   Years of education: Not on file   Highest education level: 9th grade  Occupational History   Not on file  Tobacco Use   Smoking status: Former    Current packs/day: 0.00    Average packs/day: 0.3 packs/day for 7.0 years (2.3 ttl pk-yrs)    Types: Cigarettes     Start date: 2011    Quit date: 2018    Years since quitting: 7.7    Passive exposure: Current   Smokeless tobacco: Never  Vaping Use   Vaping status: Never Used  Substance and Sexual Activity   Alcohol use: No    Alcohol/week: 0.0 standard drinks of alcohol   Drug use: No   Sexual activity: Yes    Birth control/protection: Pill  Other Topics Concern   Not on file  Social History Narrative   Lives with Lacey Hartman and 3 children   [name]   Vernell Lacey Hartman      Enjoys: playing with kids, going outside, beach, crafts      Diet: does not eat a lot of veggies or fruits, a lot of junk    Caffeine: 15 sodas   Water: 4-5 cups daily      Wears seat belt    Smoke detectors   Does not use phone while driving    Social Drivers of Corporate investment banker Strain: Low Risk  (11/22/2019)   Overall Financial Resource Strain (CARDIA)    Difficulty of Paying Living Expenses: Not hard at all  Food Insecurity: No Food Insecurity (02/15/2024)   Received from [facility]   Hunger Vital Sign    Within the past 12 months, you worried that your food would run out before you got the money to buy more.: Never true    Within the past 12 months, the food you bought just didn't last and you didn't have money to get more.: Never true  Transportation Needs: No Transportation Needs (02/15/2024)   Received from [facility] - Transportation    Lack of Transportation (Medical): No    Lack of Transportation (Non-Medical): No  Physical Activity: Sufficiently Active (02/15/2024)   Received from [facility]   Exercise Vital Sign    On average, how many days per week do you engage in moderate to strenuous exercise (like a brisk walk)?: 7 days    On average, how many minutes do you engage in exercise at this level?: 30 min  Stress: Stress Concern Present (02/15/2024)   Received from [facility] - Occupational Stress Questionnaire     Feeling of Stress : Very much  Social Connections: Moderately Isolated (11/22/2019)   Social Connection and Isolation Panel    Frequency of Communication with Friends and  Family: More than three times a week    Frequency of Social Gatherings with Friends and Family: More than three times a week    Attends Religious Services: Never    Database administrator or Organizations: No    Attends Banker Meetings: Never    Marital Status: Living with partner    Allergies:  Allergies  Allergen Reactions   Iohexol     Patient states that her granny told her she had a reaction to contrast the last time she had a contrasted CT. Patient unable to give any history of reaction.    Codeine Hives, Swelling and Rash    & hallucinations/    Metabolic Disorder Labs: Lab Results  Component Value Date   HGBA1C 5.3 06/06/2023   No results found for: PROLACTIN Lab Results  Component Value Date   CHOL 141 12/08/2021   TRIG 86 12/08/2021   HDL 41 12/08/2021   CHOLHDL 3.4 12/08/2021   LDLCALC 83 12/08/2021   LDLCALC 89 07/24/2019   Lab Results  Component Value Date   TSH 1.140 06/06/2023   TSH 1.400 12/08/2021    Therapeutic Level Labs: No results found for: LITHIUM No results found for: VALPROATE No results found for: CBMZ  Current Medications: Current Outpatient Medications  Medication Sig Dispense Refill   methylphenidate  (CONCERTA ) 36 MG PO CR tablet Take 1 tablet (36 mg total) by mouth daily. 30 tablet 0   methylphenidate  (CONCERTA ) 36 MG PO CR tablet Take 1 tablet (36 mg total) by mouth daily. 30 tablet 0   albuterol  (VENTOLIN  HFA) 108 (90 Base) MCG/ACT inhaler Inhale 1-2 puffs into the lungs every 4 (four) hours as needed for wheezing or shortness of breath. 18 g 2   clonazePAM  (KLONOPIN ) 0.5 MG tablet Take 1 tablet (0.5 mg total) by mouth 3 (three) times daily as needed for anxiety. 90 tablet 2   FLUoxetine  (PROZAC ) 20 MG capsule Take 1 capsule (20 mg total) by  mouth daily. 30 capsule 2   FLUoxetine  (PROZAC ) 40 MG capsule Take 1 capsule (40 mg total) by mouth daily. 30 capsule 2   methylphenidate  (CONCERTA ) 36 MG PO CR tablet Take 1 tablet (36 mg total) by mouth every morning. 30 tablet 0   ondansetron  (ZOFRAN ) 8 MG tablet Take 1 tablet (8 mg total) by mouth every 8 (eight) hours as needed for nausea or vomiting. 20 tablet 1   traZODone  (DESYREL ) 50 MG tablet Take 1 tablet (50 mg total) by mouth at bedtime. 30 tablet 2   No current facility-administered medications for this visit.     Musculoskeletal: Strength & Muscle Tone: within normal limits Gait & Station: normal Patient leans: N/A  Psychiatric Specialty Exam: Review of Systems  HENT:  Positive for congestion.   All other systems reviewed and are negative.   There were no vitals taken for this visit.There is no height or weight on file to calculate BMI.  General Appearance: Casual and Fairly Groomed  Eye Contact:  Good  Speech:  Clear and Coherent  Volume:  Normal  Mood:  Euthymic  Affect:  Congruent  Thought Process:  Goal Directed  Orientation:  Full (Time, Place, and Person)  Thought Content: WDL   Suicidal Thoughts:  No  Homicidal Thoughts:  No  Memory:  Immediate;   Good Recent;   Good Remote;   Fair  Judgement:  Good  Insight:  Fair  Psychomotor Activity:  Normal  Concentration:  Concentration: Good and Attention Span: Good  Recall:  [place] of Knowledge: Fair  Language: Good  Akathisia:  No  Handed:  Right  AIMS (if indicated): not done  Assets:  Communication Skills Desire for Improvement Physical Health Resilience Social Support  ADL's:  Intact  Cognition: Impaired,  Mild  Sleep:  Good   Screenings: GAD-7    Flowsheet Row Video Visit from 08/11/2023 in [facility] Office Visit from 06/06/2023 in [facility] Care Office Visit from 02/17/2023 in [facility] at [place] Office Visit  from 02/04/2023 in [facility] Video Visit from 03/11/2020 in [facility]  Total GAD-7 Score 3 21 21 14 5    PHQ2-9    Flowsheet Row Video Visit from 08/11/2023 in [facility] Office Visit from 06/06/2023 in [facility] Office Visit from 02/17/2023 in [facility] at [place] Office Visit from 02/04/2023 in [facility] Care Video Visit from 11/16/2022 in [facility] at [facility] Total Score 1 2 2 3 3   PHQ-9 Total Score 1 15 15 14 9    Flowsheet Row ED from 05/02/2024 in [facility] at [facility] ED from 10/04/2023 in [facility] Department at [facility] Office Visit from 02/17/2023 in [facility] at [place]  C-SSRS RISK CATEGORY No Risk No Risk No Risk     Assessment and Plan: This patient is a 28 year old female with a history of mild cognitive impairment, hearing loss depression anxiety and ADD.  Most recently she has been dealing with postpartum depression and anxiety.  She is doing well on her current regimen.  She will continue clonazepam  0.5 mg 3 times daily as needed for anxiety, Prozac  60 mg daily for depression and Concerta  36 mg every morning for ADD.  She will return to see me in 3 months  Collaboration of Care: Collaboration of Care: Other provider involved in patient's care AEB notes are shared with OB/GYN on the epic system  Patient/Guardian was advised Release of Information must be obtained prior to any record release in order to collaborate their care with an outside provider. Patient/Guardian was advised if they have not already done so to contact the registration department to sign all necessary forms in order for us  to release information regarding their care.   Consent: Patient/Guardian gives verbal consent for treatment and  assignment of benefits for services provided during this visit. Patient/Guardian expressed understanding and agreed to proceed.    Barnie Gull, MD 07/10/2024, 11:25 AM

## 2024-08-20 ENCOUNTER — Encounter: Payer: Self-pay | Admitting: Radiology

## 2024-10-08 ENCOUNTER — Encounter (HOSPITAL_COMMUNITY): Payer: Self-pay | Admitting: Emergency Medicine

## 2024-10-08 ENCOUNTER — Other Ambulatory Visit: Payer: Self-pay

## 2024-10-08 ENCOUNTER — Emergency Department (HOSPITAL_COMMUNITY)
Admission: EM | Admit: 2024-10-08 | Discharge: 2024-10-08 | Disposition: A | Discharge status: Home / Self Care | Attending: Emergency Medicine | Admitting: Emergency Medicine

## 2024-10-08 DIAGNOSIS — K0889 Other specified disorders of teeth and supporting structures: Secondary | ICD-10-CM | POA: Insufficient documentation

## 2024-10-08 DIAGNOSIS — H6091 Unspecified otitis externa, right ear: Secondary | ICD-10-CM | POA: Diagnosis not present

## 2024-10-08 DIAGNOSIS — H60501 Unspecified acute noninfective otitis externa, right ear: Secondary | ICD-10-CM

## 2024-10-08 DIAGNOSIS — G8929 Other chronic pain: Secondary | ICD-10-CM | POA: Insufficient documentation

## 2024-10-08 DIAGNOSIS — H9201 Otalgia, right ear: Secondary | ICD-10-CM | POA: Diagnosis present

## 2024-10-08 MED ORDER — CIPROFLOXACIN-DEXAMETHASONE 0.3-0.1 % OT SUSP: 4.0000 [drp] | Freq: Two times a day (BID) | OTIC | 0 refills | Status: AC

## 2024-10-08 MED ORDER — ACETAMINOPHEN 325 MG PO TABS
650.0000 mg | ORAL_TABLET | Freq: Once | ORAL | Status: AC
  Administered 2024-10-08: 650 mg via ORAL
  Filled 2024-10-08: qty 2

## 2024-10-08 MED ORDER — CIPROFLOXACIN-DEXAMETHASONE 0.3-0.1 % OT SUSP
4.0000 [drp] | Freq: Two times a day (BID) | OTIC | Status: DC
  Administered 2024-10-08: 4 [drp] via OTIC
  Filled 2024-10-08: qty 7.5

## 2024-10-08 MED ORDER — CEPHALEXIN 500 MG PO CAPS
500.0000 mg | ORAL_CAPSULE | Freq: Once | ORAL | Status: AC
  Administered 2024-10-08: 500 mg via ORAL
  Filled 2024-10-08: qty 1

## 2024-10-08 MED ORDER — CEPHALEXIN 500 MG PO CAPS: 500.0000 mg | ORAL_CAPSULE | Freq: Two times a day (BID) | ORAL | 0 refills | Status: AC

## 2024-10-08 NOTE — ED Triage Notes (Signed)
 Pt arrived to ED c/o right ear pain since this morning.

## 2024-10-08 NOTE — ED Provider Notes (Signed)
 " [facility] EMERGENCY DEPARTMENT AT [facility] Provider Note   CSN: [id number] Arrival date & time: 10/08/24  0045     Patient presents with: Otalgia   CLARE FENNIMORE is a 28 y.o. female.   The history is provided by the patient.  Otalgia  He has history of asthma, attention deficit disorder and comes in complaining of pain in her right ear which started tonight.  She denies hearing loss, denies trauma.  Denies fever or chills.  She has taken ibuprofen  without relief.  She also has a second complaint of pain in the right tooth that has been bothering her for the past year.  She has not seen a dentist.  It is not any worse tonight than it usually is.    Prior to Admission medications  Medication Sig Start Date End Date Taking? Authorizing Provider  albuterol  (VENTOLIN  HFA) 108 (90 Base) MCG/ACT inhaler Inhale 1-2 puffs into the lungs every 4 (four) hours as needed for wheezing or shortness of breath. 05/18/24   Bevely Doffing, FNP  clonazePAM  (KLONOPIN ) 0.5 MG tablet Take 1 tablet (0.5 mg total) by mouth 3 (three) times daily as needed for anxiety. 07/10/24 07/10/25  Okey Barnie SAUNDERS, MD  FLUoxetine  (PROZAC ) 20 MG capsule Take 1 capsule (20 mg total) by mouth daily. 07/10/24   Okey Barnie SAUNDERS, MD  FLUoxetine  (PROZAC ) 40 MG capsule Take 1 capsule (40 mg total) by mouth daily. 07/10/24   Okey Barnie SAUNDERS, MD  methylphenidate  (CONCERTA ) 36 MG PO CR tablet Take 1 tablet (36 mg total) by mouth every morning. 07/10/24   Okey Barnie SAUNDERS, MD  methylphenidate  (CONCERTA ) 36 MG PO CR tablet Take 1 tablet (36 mg total) by mouth daily. 07/10/24 07/10/25  Okey Barnie SAUNDERS, MD  methylphenidate  (CONCERTA ) 36 MG PO CR tablet Take 1 tablet (36 mg total) by mouth daily. 07/10/24 07/10/25  Okey Barnie SAUNDERS, MD  ondansetron  (ZOFRAN ) 8 MG tablet Take 1 tablet (8 mg total) by mouth every 8 (eight) hours as needed for nausea or vomiting. 08/11/23   Melvenia Manus BRAVO, MD  traZODone  (DESYREL ) 50 MG tablet Take 1  tablet (50 mg total) by mouth at bedtime. 07/10/24   Okey Barnie SAUNDERS, MD    Allergies: Iohexol and Codeine    Review of Systems  HENT:  Positive for ear pain.   All other systems reviewed and are negative.   Updated Vital Signs BP 135/87 (BP Location: Left Arm)   Pulse 79   Temp 97.9 F (36.6 C) (Oral)   Resp 16   Ht 5' (1.524 m)   Wt 83 kg   SpO2 99%   BMI 35.74 kg/m   Physical Exam Vitals and nursing note reviewed.   28 year old female, resting comfortably and in no acute distress. Vital signs are normal. Oxygen saturation is 99%, which is normal. Head is normocephalic and atraumatic. PERRLA, EOMI. left tympanic membrane is clear.  Right tympanic membrane is clear but there is some edema and tenderness of the right external auditory canal.  There is no drainage noted.  There is 2 appear to be caries of tooth #2, but not especially extensive.  There is tenderness to percussion over tooth #2 and 3.  There is no gingival swelling or erythema or pallor. Neck is nontender and supple without adenopathy. Lungs are clear without rales, wheezes, or rhonchi. Heart has regular rate and rhythm without murmur. Skin is warm and dry without rash. Neurologic: Awake and alert, moves  all extremities equally.    Procedures   Medications Ordered in the ED  cephALEXin  (KEFLEX ) capsule 500 mg (has no administration in time range)  ciprofloxacin -dexamethasone  (CIPRODEX ) 0.3-0.1 % OTIC (EAR) suspension 4 drop (has no administration in time range)  acetaminophen  (TYLENOL ) tablet 650 mg (has no administration in time range)                                    Medical Decision Making Risk OTC drugs. Prescription drug management.   Right otitis externa.  Chronic dental pain with some relatively minor caries noted on tooth #2.  I have ordered a dose of ciprofloxacin -dexamethasone  suspension, acetaminophen , cephalexin .  I am discharging her with prescriptions for cephalexin  and  ciprofloxacin -dexamethasone , advised her to use over-the-counter NSAIDs and acetaminophen  as needed for pain.  Ibuprofen  and on-call dentistry.     Final diagnoses:  Acute otitis externa of right ear, unspecified type  Chronic dental pain    ED Discharge Orders          Ordered    cephALEXin  (KEFLEX ) 500 MG capsule  2 times daily        10/08/24 0310    ciprofloxacin -dexamethasone  (CIPRODEX ) OTIC suspension  2 times daily        10/08/24 0310               Raford Lenis, MD 10/08/24 0315  "

## 2024-10-08 NOTE — Discharge Instructions (Signed)
 Take 2 ibuprofen  and 2 acetaminophen  every 6 hours as needed for pain.  When you combine ibuprofen  and acetaminophen , you will get better pain relief and you get from taking either medication by itself.  You need to see your dentist about your tooth pain.  It will not get better just with antibiotics.

## 2024-10-09 ENCOUNTER — Encounter (HOSPITAL_COMMUNITY): Payer: Self-pay | Admitting: Psychiatry

## 2024-10-09 ENCOUNTER — Ambulatory Visit (INDEPENDENT_AMBULATORY_CARE_PROVIDER_SITE_OTHER): Admitting: Psychiatry

## 2024-10-09 VITALS — BP 122/69 | HR 95 | Ht 60.0 in | Wt 181.2 lb

## 2024-10-09 DIAGNOSIS — F900 Attention-deficit hyperactivity disorder, predominantly inattentive type: Secondary | ICD-10-CM | POA: Diagnosis not present

## 2024-10-09 DIAGNOSIS — F53 Postpartum depression: Secondary | ICD-10-CM

## 2024-10-09 MED ORDER — TRAZODONE HCL 50 MG PO TABS: 50.0000 mg | ORAL_TABLET | Freq: Every day | ORAL | 2 refills | Status: DC

## 2024-10-09 MED ORDER — DESVENLAFAXINE SUCCINATE ER 100 MG PO TB24: 100.0000 mg | ORAL_TABLET | Freq: Every day | ORAL | 3 refills | Status: DC

## 2024-10-09 MED ORDER — METHYLPHENIDATE HCL ER (OSM) 36 MG PO TBCR: 36.0000 mg | EXTENDED_RELEASE_TABLET | Freq: Every day | ORAL | 0 refills | Status: DC

## 2024-10-09 MED ORDER — CLONAZEPAM 0.5 MG PO TABS: 0.5000 mg | ORAL_TABLET | Freq: Two times a day (BID) | ORAL | 2 refills | Status: DC

## 2024-10-09 MED ORDER — METHYLPHENIDATE HCL ER (OSM) 36 MG PO TBCR: 36.0000 mg | EXTENDED_RELEASE_TABLET | ORAL | 0 refills | Status: DC

## 2024-10-09 NOTE — Progress Notes (Signed)
 [facility] MD/PA/NP OP Progress Note  10/09/2024 3:45 PM Lacey Hartman  MRN:  [id number]  Chief Complaint:  Chief Complaint  Patient presents with   Anxiety   Depression   ADD   Follow-up   HPI: This patient is a 28 year old white female who lives with her boyfriend and now 4 children in [place]. She is on disability and a full-time homemaker   The patient returns for follow-up after 3 months regarding her major depression generalized anxiety and ADD.  She had been dealing with postpartum depression as well.  Her recent baby girl is now 51 months old and she states the daughter is doing very well.  However she states that she herself has not been doing as well.  She has been more down angry and irritable her family has noted a big change.  The patient states she has lost a lot of family members in the last year and then went through the depression after giving birth.  She is sleeping and eating well her energy is a little bit low she denies thoughts of self-harm or suicide but just feels less joy in doing things particular with all the family missing for the holidays.  She has been on Prozac  for a number of years and we have gradually increase the dose to now where she is at 60 mg daily.  She really does not think it is helping anymore.  I suggested trying an SNRI such as Pristiq  and she is agreeable Visit Diagnosis:    ICD-10-CM   1. 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 Three primary cancers   Cancer Paternal Grandmother        breast   Angioedema Neg Hx    Atopy Neg Hx    Eczema Neg Hx    Immunodeficiency Neg Hx    Urticaria Neg Hx     Social History:  Social History   Socioeconomic History   Marital status: Significant Other  Spouse name: Lacey Hartman   Number of children: 3   Years of education: Not on file   Highest education level: 9th grade  Occupational History   Not on file  Tobacco Use   Smoking status: Former    Current packs/day: 0.00    Average packs/day: 0.3 packs/day for 7.0 years (2.3 ttl pk-yrs)    Types: Cigarettes    Start date: 2011    Quit date: 2018    Years since quitting: 7.9    Passive exposure: Current   Smokeless tobacco: Never  Vaping Use   Vaping status: Never Used  Substance and Sexual Activity   Alcohol use: No    Alcohol/week: 0.0 standard drinks of alcohol   Drug use: No   Sexual activity: Yes    Birth control/protection: Pill  Other Topics Concern   Not on file  Social History Narrative   Lives with Lacey Hartman and 3 children   [name]   Vernell Lacey Hartman      Enjoys: playing with kids, going outside, beach, crafts      Diet: does not eat a lot of veggies or fruits, a lot of junk    Caffeine: 15 sodas   Water: 4-5 cups daily      Wears seat belt    Smoke detectors   Does not use phone  while driving    Social Drivers of Health   Tobacco Use: Medium Risk (10/09/2024)   Patient History    Smoking Tobacco Use: Former    Smokeless Tobacco Use: Never    Passive Exposure: Current  Programmer, Applications: Not on file  Food Insecurity: No Food Insecurity (02/15/2024)   [facility]   Epic    Within the past 12 months, you worried that your food would run out before you got the money to buy more.: Never true    Within the past 12 months, the food you bought just didn't last and you didn't have money to get more.: Never true  Transportation Needs: No Transportation Needs (02/15/2024)   Received from [facility] - Transportation    Lack of Transportation (Medical): No    Lack of Transportation (Non-Medical): No  Physical Activity: Sufficiently Active (02/15/2024)   Received from [facility]   Exercise Vital Sign    On average, how many days per week do you engage in moderate to strenuous exercise (like a brisk walk)?: 7 days    On average, how many minutes do you engage in exercise at this level?: 30 min  Stress: Stress Concern Present (02/15/2024)   Received from [facility] - Occupational Stress Questionnaire    Feeling of Stress : Very much  Social Connections: Not on file  Depression (PHQ2-9): Low Risk (10/09/2024)   Depression (PHQ2-9)    PHQ-2 Score: 4  Alcohol Screen: Not on file  Housing: Not on file  Utilities: Low Risk (02/15/2024)   Received from [facility]   Utilities    Within the past 12 months, have you been unable to get utilities(heat, electricity) when it was really needed?: No  Health Literacy: Low Risk (02/15/2024)   Received from [facility] Literacy    How often do you need to have someone help you when you read instructions, pamphlets, or other written material from your doctor or pharmacy?: Never    Allergies: Allergies[1]  Metabolic Disorder  Labs: Lab Results  Component Value Date   HGBA1C 5.3 06/06/2023   No results found for: PROLACTIN Lab Results  Component Value Date   CHOL 141 12/08/2021   TRIG 86 12/08/2021   HDL 41 12/08/2021   CHOLHDL 3.4 12/08/2021   LDLCALC 83 12/08/2021   LDLCALC 89 07/24/2019   Lab Results  Component Value Date   TSH 1.140 06/06/2023   TSH 1.400 12/08/2021    Therapeutic Level Labs: No results found for: LITHIUM No results found for: VALPROATE No results found for: CBMZ  Current Medications: Current Outpatient Medications  Medication Sig Dispense Refill   albuterol  (VENTOLIN  HFA) 108 (90 Base) MCG/ACT inhaler Inhale 1-2 puffs into the lungs every 4 (four) hours as needed for wheezing or shortness of breath. 18 g 2   cephALEXin  (KEFLEX ) 500 MG capsule Take 1 capsule (500 mg total) by mouth 2 (two) times daily. 14 capsule 0   ciprofloxacin -dexamethasone  (CIPRODEX ) OTIC suspension Place 4 drops into the right ear 2 (two) times daily. 7.5 mL 0   desvenlafaxine  (PRISTIQ ) 100 MG 24 hr tablet Take 1 tablet (100 mg total) by mouth daily. 30 tablet 3   methylphenidate  (CONCERTA ) 36 MG PO CR tablet Take 1 tablet (36 mg total) by mouth daily. 30 tablet 0   ondansetron  (ZOFRAN ) 8 MG tablet Take 1 tablet (8 mg total) by mouth every 8 (eight) hours as needed for nausea or vomiting. 20 tablet 1   clonazePAM  (KLONOPIN ) 0.5 MG tablet Take 1 tablet (0.5 mg total) by mouth 2 (two) times daily. 60 tablet 2   methylphenidate  (CONCERTA ) 36 MG PO CR tablet Take 1 tablet (36 mg total) by mouth every morning. 30 tablet 0   methylphenidate  (CONCERTA ) 36 MG PO CR tablet Take 1 tablet (36 mg total) by mouth daily. 30 tablet 0   traZODone  (DESYREL ) 50 MG tablet Take 1 tablet (50 mg total) by mouth at bedtime. 30 tablet 2   No current facility-administered medications for this visit.     Musculoskeletal: Strength & Muscle Tone: within normal limits Gait & Station: normal Patient leans:  N/A  Psychiatric Specialty Exam: Review of Systems  Psychiatric/Behavioral:  Positive for dysphoric mood.   All other systems reviewed and are negative.   Blood pressure 122/69, pulse 95, height 5' (1.524 m), weight 181 lb 3.2 oz (82.2 kg), last menstrual period 10/09/2024, SpO2 99%.Body mass index is 35.39 kg/m.  General Appearance: Casual and Fairly Groomed  Eye Contact:  Good  Speech:  Clear and Coherent  Volume:  Normal  Mood:  Dysphoric  Affect:  Flat  Thought Process:  Goal Directed  Orientation:  Full (Time, Place, and Person)  Thought Content: Rumination   Suicidal Thoughts:  No  Homicidal Thoughts:  No  Memory:  Immediate;   Good Recent;   Good Remote;   NA  Judgement:  Fair  Insight:  Fair  Psychomotor Activity:  Decreased  Concentration:  Concentration: Good and Attention Span: Good  Recall:  Good  Fund of Knowledge: Fair  Language: Good  Akathisia:  No  Handed:  Right  AIMS (if indicated): not done  Assets:  Communication Skills Desire for Improvement Physical Health Resilience Social Support Talents/Skills  ADL's:  Intact  Cognition: Impaired,  Mild  Sleep:  Good   Screenings: GAD-7    Flowsheet Row Video Visit from 08/11/2023 in [facility] Office Visit from 06/06/2023 in [facility] Office Visit from 02/17/2023 in [facility]al [facility] at [facility] Visit from 02/04/2023 in [facility] Video Visit from 03/11/2020 in [facility] Care  Total GAD-7 Score 3 21 21 14 5    PHQ2-9    Flowsheet Row Office Visit from 10/09/2024 in [facility] at [place] Video Visit from 08/11/2023 in [facility] Office Visit from 06/06/2023 in [facility] Care Office Visit from 02/17/2023 in [facility] Outpatient Behavioral Health at [place] Office Visit from 02/04/2023 in [facility] Primary Care  PHQ-2 Total Score 2 1 2 2 3   PHQ-9 Total Score 4 1 15 15 14    Flowsheet Row Office Visit from 10/09/2024 in [facility] at [place] ED from 10/08/2024 in [facility] Emergency Department at [facility] ED from 05/02/2024 in [facility] Emergency Department at [facility]  C-SSRS RISK CATEGORY No Risk No Risk No Risk     Assessment and Plan: This patient is a 28 year old female with a history of mild cognitive impairment hearing loss major depression generalized anxiety and ADD inattentive type.  She is not doing well in terms of her mood so we will discontinue Prozac  in favor of Pristiq  100 mg daily for major depression.  She will continue clonazepam  0.5 mg twice daily as needed for anxiety and Concerta  36 mg every morning for ADD as well as trazodone  50 mg at bedtime for sleep.  She will return to see me in 6 weeks  Collaboration of Care: Collaboration of Care: Referral or follow-up with counselor/therapist AEB patient will be referred to therapist Lauraine Ferrari for therapy  Patient/Guardian was advised Release of Information must be obtained prior to any record release in order to collaborate their care with an outside provider. Patient/Guardian was advised if they have not already done so to contact the registration department to sign all necessary forms in order for us  to release information regarding their care.   Consent: Patient/Guardian gives verbal consent for treatment and assignment of benefits for services provided during this visit. Patient/Guardian expressed understanding and agreed to proceed.    Barnie Gull, MD 10/09/2024, 3:45 PM     [1]  Allergies Allergen Reactions   Iohexol     Patient states that her granny told her she had a reaction to contrast the last time she had a contrasted CT. Patient unable to give any history of reaction.    Codeine Hives, Swelling and Rash    & hallucinations/

## 2024-10-22 ENCOUNTER — Ambulatory Visit (HOSPITAL_COMMUNITY)

## 2024-10-22 NOTE — Progress Notes (Unsigned)
 Lacey Hartman did not arrive for her scheduled appointment today

## 2024-11-09 ENCOUNTER — Emergency Department (HOSPITAL_COMMUNITY)
Admission: EM | Admit: 2024-11-09 | Discharge: 2024-11-10 | Disposition: A | Attending: Emergency Medicine | Admitting: Emergency Medicine

## 2024-11-09 ENCOUNTER — Encounter (HOSPITAL_COMMUNITY): Payer: Self-pay

## 2024-11-09 DIAGNOSIS — Z5321 Procedure and treatment not carried out due to patient leaving prior to being seen by health care provider: Secondary | ICD-10-CM | POA: Diagnosis not present

## 2024-11-09 DIAGNOSIS — H9201 Otalgia, right ear: Secondary | ICD-10-CM | POA: Insufficient documentation

## 2024-11-09 DIAGNOSIS — R0981 Nasal congestion: Secondary | ICD-10-CM | POA: Insufficient documentation

## 2024-11-09 NOTE — ED Triage Notes (Signed)
 Pt comes in for right ear pain. Pain has been going on for 3 days. Pt also has sinus congestion. A&Ox4.

## 2024-11-20 ENCOUNTER — Encounter (HOSPITAL_COMMUNITY): Payer: Self-pay | Admitting: Psychiatry

## 2024-11-20 ENCOUNTER — Telehealth (HOSPITAL_COMMUNITY): Admitting: Psychiatry

## 2024-11-20 DIAGNOSIS — F900 Attention-deficit hyperactivity disorder, predominantly inattentive type: Secondary | ICD-10-CM

## 2024-11-20 DIAGNOSIS — F331 Major depressive disorder, recurrent, moderate: Secondary | ICD-10-CM

## 2024-11-20 DIAGNOSIS — F53 Postpartum depression: Secondary | ICD-10-CM

## 2024-11-20 MED ORDER — METHYLPHENIDATE HCL ER (OSM) 36 MG PO TBCR: 36.0000 mg | EXTENDED_RELEASE_TABLET | ORAL | 0 refills | Status: AC

## 2024-11-20 MED ORDER — METHYLPHENIDATE HCL ER (OSM) 36 MG PO TBCR: 36.0000 mg | EXTENDED_RELEASE_TABLET | Freq: Every day | ORAL | 0 refills | Status: AC

## 2024-11-20 MED ORDER — CLONAZEPAM 0.5 MG PO TABS: 0.5000 mg | ORAL_TABLET | Freq: Two times a day (BID) | ORAL | 2 refills | Status: AC

## 2024-11-20 MED ORDER — DESVENLAFAXINE SUCCINATE ER 100 MG PO TB24: 100.0000 mg | ORAL_TABLET | Freq: Every day | ORAL | 3 refills | Status: AC

## 2024-11-20 MED ORDER — TRAZODONE HCL 50 MG PO TABS: 50.0000 mg | ORAL_TABLET | Freq: Every day | ORAL | 2 refills | Status: AC
